# Patient Record
Sex: Female | Born: 1955 | Race: White | Hispanic: No | Marital: Married | Smoking: Former smoker
Health system: Southern US, Community
[De-identification: ages and names within clinical notes are randomized; demographics above are authoritative.]

## PROBLEM LIST (undated history)

## (undated) DIAGNOSIS — R238 Other skin changes: Secondary | ICD-10-CM

## (undated) DIAGNOSIS — R0602 Shortness of breath: Secondary | ICD-10-CM

## (undated) DIAGNOSIS — H3500 Unspecified background retinopathy: Secondary | ICD-10-CM

## (undated) DIAGNOSIS — K635 Polyp of colon: Secondary | ICD-10-CM

## (undated) DIAGNOSIS — K625 Hemorrhage of anus and rectum: Secondary | ICD-10-CM

## (undated) DIAGNOSIS — R5383 Other fatigue: Secondary | ICD-10-CM

## (undated) DIAGNOSIS — K760 Fatty (change of) liver, not elsewhere classified: Secondary | ICD-10-CM

## (undated) DIAGNOSIS — M1990 Unspecified osteoarthritis, unspecified site: Secondary | ICD-10-CM

## (undated) DIAGNOSIS — I10 Essential (primary) hypertension: Secondary | ICD-10-CM

## (undated) DIAGNOSIS — E139 Other specified diabetes mellitus without complications: Secondary | ICD-10-CM

## (undated) DIAGNOSIS — K589 Irritable bowel syndrome without diarrhea: Secondary | ICD-10-CM

## (undated) DIAGNOSIS — G2581 Restless legs syndrome: Secondary | ICD-10-CM

## (undated) DIAGNOSIS — K7290 Hepatic failure, unspecified without coma: Secondary | ICD-10-CM

## (undated) DIAGNOSIS — R413 Other amnesia: Secondary | ICD-10-CM

## (undated) DIAGNOSIS — K7460 Unspecified cirrhosis of liver: Secondary | ICD-10-CM

## (undated) DIAGNOSIS — K219 Gastro-esophageal reflux disease without esophagitis: Secondary | ICD-10-CM

## (undated) DIAGNOSIS — R61 Generalized hyperhidrosis: Secondary | ICD-10-CM

## (undated) DIAGNOSIS — K649 Unspecified hemorrhoids: Secondary | ICD-10-CM

## (undated) DIAGNOSIS — F32A Depression, unspecified: Secondary | ICD-10-CM

## (undated) DIAGNOSIS — R630 Anorexia: Secondary | ICD-10-CM

## (undated) DIAGNOSIS — E785 Hyperlipidemia, unspecified: Secondary | ICD-10-CM

## (undated) DIAGNOSIS — Z803 Family history of malignant neoplasm of breast: Secondary | ICD-10-CM

## (undated) DIAGNOSIS — K469 Unspecified abdominal hernia without obstruction or gangrene: Secondary | ICD-10-CM

## (undated) DIAGNOSIS — D649 Anemia, unspecified: Secondary | ICD-10-CM

## (undated) DIAGNOSIS — F419 Anxiety disorder, unspecified: Secondary | ICD-10-CM

## (undated) DIAGNOSIS — F329 Major depressive disorder, single episode, unspecified: Secondary | ICD-10-CM

## (undated) DIAGNOSIS — R21 Rash and other nonspecific skin eruption: Secondary | ICD-10-CM

## (undated) DIAGNOSIS — G629 Polyneuropathy, unspecified: Secondary | ICD-10-CM

## (undated) DIAGNOSIS — H3530 Unspecified macular degeneration: Secondary | ICD-10-CM

## (undated) DIAGNOSIS — Z972 Presence of dental prosthetic device (complete) (partial): Secondary | ICD-10-CM

## (undated) DIAGNOSIS — J449 Chronic obstructive pulmonary disease, unspecified: Secondary | ICD-10-CM

## (undated) DIAGNOSIS — R233 Spontaneous ecchymoses: Secondary | ICD-10-CM

## (undated) DIAGNOSIS — K7682 Hepatic encephalopathy: Secondary | ICD-10-CM

## (undated) HISTORY — DX: Other skin changes: R23.8

## (undated) HISTORY — DX: Other fatigue: R53.83

## (undated) HISTORY — DX: Generalized hyperhidrosis: R61

## (undated) HISTORY — DX: Shortness of breath: R06.02

## (undated) HISTORY — DX: Hyperlipidemia, unspecified: E78.5

## (undated) HISTORY — DX: Unspecified osteoarthritis, unspecified site: M19.90

## (undated) HISTORY — DX: Essential (primary) hypertension: I10

## (undated) HISTORY — DX: Presence of dental prosthetic device (complete) (partial): Z97.2

## (undated) HISTORY — DX: Rash and other nonspecific skin eruption: R21

## (undated) HISTORY — DX: Other amnesia: R41.3

## (undated) HISTORY — DX: Spontaneous ecchymoses: R23.3

## (undated) HISTORY — DX: Unspecified abdominal hernia without obstruction or gangrene: K46.9

## (undated) HISTORY — DX: Restless legs syndrome: G25.81

## (undated) HISTORY — DX: Unspecified hemorrhoids: K64.9

## (undated) HISTORY — DX: Family history of malignant neoplasm of breast: Z80.3

## (undated) HISTORY — DX: Polyp of colon: K63.5

## (undated) HISTORY — DX: Irritable bowel syndrome, unspecified: K58.9

## (undated) HISTORY — DX: Hemorrhage of anus and rectum: K62.5

## (undated) HISTORY — DX: Unspecified macular degeneration: H35.30

## (undated) HISTORY — DX: Unspecified background retinopathy: H35.00

## (undated) HISTORY — PX: HERNIA REPAIR: SHX51

## (undated) HISTORY — DX: Anorexia: R63.0

---

## 1997-07-20 ENCOUNTER — Other Ambulatory Visit: Admission: RE | Admit: 1997-07-20 | Discharge: 1997-07-20 | Payer: Self-pay | Admitting: Obstetrics and Gynecology

## 1998-12-17 ENCOUNTER — Emergency Department (HOSPITAL_COMMUNITY): Admission: EM | Admit: 1998-12-17 | Discharge: 1998-12-18 | Payer: Self-pay | Admitting: Emergency Medicine

## 1998-12-18 ENCOUNTER — Encounter: Payer: Self-pay | Admitting: Emergency Medicine

## 1999-02-18 ENCOUNTER — Other Ambulatory Visit: Admission: RE | Admit: 1999-02-18 | Discharge: 1999-02-28 | Payer: Self-pay

## 1999-11-15 ENCOUNTER — Other Ambulatory Visit: Admission: RE | Admit: 1999-11-15 | Discharge: 1999-11-15 | Payer: Self-pay | Admitting: Obstetrics and Gynecology

## 2006-10-22 ENCOUNTER — Other Ambulatory Visit: Admission: RE | Admit: 2006-10-22 | Discharge: 2006-10-22 | Payer: Self-pay | Admitting: Family Medicine

## 2007-06-06 ENCOUNTER — Emergency Department (HOSPITAL_COMMUNITY): Admission: EM | Admit: 2007-06-06 | Discharge: 2007-06-06 | Payer: Self-pay | Admitting: Emergency Medicine

## 2008-09-07 ENCOUNTER — Other Ambulatory Visit: Admission: RE | Admit: 2008-09-07 | Discharge: 2008-09-07 | Payer: Self-pay | Admitting: Family Medicine

## 2009-09-14 ENCOUNTER — Encounter: Admission: RE | Admit: 2009-09-14 | Discharge: 2009-09-14 | Payer: Self-pay | Admitting: Gastroenterology

## 2010-04-07 ENCOUNTER — Encounter: Payer: Self-pay | Admitting: Gastroenterology

## 2010-06-17 ENCOUNTER — Other Ambulatory Visit: Payer: Self-pay | Admitting: Family Medicine

## 2010-06-17 DIAGNOSIS — R1900 Intra-abdominal and pelvic swelling, mass and lump, unspecified site: Secondary | ICD-10-CM

## 2010-06-20 ENCOUNTER — Ambulatory Visit
Admission: RE | Admit: 2010-06-20 | Discharge: 2010-06-20 | Disposition: A | Discharge status: Home / Self Care | Payer: 59 | Source: Ambulatory Visit | Attending: Family Medicine | Admitting: Family Medicine

## 2010-06-20 DIAGNOSIS — R1900 Intra-abdominal and pelvic swelling, mass and lump, unspecified site: Secondary | ICD-10-CM

## 2010-06-20 MED ORDER — IOHEXOL 300 MG/ML  SOLN
125.0000 mL | Freq: Once | INTRAMUSCULAR | Status: AC | PRN
  Administered 2010-06-20: 125 mL via INTRAVENOUS

## 2010-06-27 ENCOUNTER — Other Ambulatory Visit: Payer: Self-pay | Admitting: Family Medicine

## 2010-06-27 DIAGNOSIS — K7689 Other specified diseases of liver: Secondary | ICD-10-CM

## 2010-06-29 ENCOUNTER — Ambulatory Visit
Admission: RE | Admit: 2010-06-29 | Discharge: 2010-06-29 | Disposition: A | Discharge status: Home / Self Care | Payer: 59 | Source: Ambulatory Visit | Attending: Family Medicine | Admitting: Family Medicine

## 2010-06-29 DIAGNOSIS — K7689 Other specified diseases of liver: Secondary | ICD-10-CM

## 2010-06-29 MED ORDER — GADOBENATE DIMEGLUMINE 529 MG/ML IV SOLN
20.0000 mL | Freq: Once | INTRAVENOUS | Status: AC | PRN
  Administered 2010-06-29: 20 mL via INTRAVENOUS

## 2010-11-05 ENCOUNTER — Encounter (INDEPENDENT_AMBULATORY_CARE_PROVIDER_SITE_OTHER): Payer: Self-pay | Admitting: Surgery

## 2010-11-05 ENCOUNTER — Encounter (INDEPENDENT_AMBULATORY_CARE_PROVIDER_SITE_OTHER): Payer: 59 | Admitting: Surgery

## 2010-11-06 ENCOUNTER — Ambulatory Visit (INDEPENDENT_AMBULATORY_CARE_PROVIDER_SITE_OTHER): Payer: 59 | Admitting: Surgery

## 2010-11-06 ENCOUNTER — Encounter (INDEPENDENT_AMBULATORY_CARE_PROVIDER_SITE_OTHER): Payer: Self-pay | Admitting: Surgery

## 2010-11-06 VITALS — BP 132/86 | HR 76 | Temp 97.2°F | Ht 64.0 in | Wt 203.0 lb

## 2010-11-06 DIAGNOSIS — M6208 Separation of muscle (nontraumatic), other site: Secondary | ICD-10-CM | POA: Insufficient documentation

## 2010-11-06 DIAGNOSIS — K811 Chronic cholecystitis: Secondary | ICD-10-CM | POA: Insufficient documentation

## 2010-11-06 DIAGNOSIS — R1011 Right upper quadrant pain: Secondary | ICD-10-CM | POA: Insufficient documentation

## 2010-11-06 DIAGNOSIS — M6200 Separation of muscle (nontraumatic), unspecified site: Secondary | ICD-10-CM

## 2010-11-06 DIAGNOSIS — K824 Cholesterolosis of gallbladder: Secondary | ICD-10-CM

## 2010-11-06 DIAGNOSIS — R109 Unspecified abdominal pain: Secondary | ICD-10-CM

## 2010-11-06 NOTE — Progress Notes (Signed)
This encounter was created in error - please disregard.

## 2010-11-06 NOTE — Patient Instructions (Signed)
See Handouts.  Consider surgery

## 2010-11-06 NOTE — Progress Notes (Addendum)
Subjective:     Patient ID: [name], female   DOB: September 13, 1955, 55 y.o.   MRN: [id number]  HPI  Patient is a pleasant obese female who is noticed some abdominal pains for the past 2 years and a new lump in her upper abdomen in the past few months.  Dr. Clarene Duke sent the patient to me over concerns of a possible ventral wall hernia  She does get some intermittent pains in the upper abdomen. Often near on the right upper quadrant as well. Occasionally pulling on the left side. Usually it is burning.  She's had a couple episodes of nausea and vomiting associated with it. Usually after eating. She tolerated most foods. The pain does not seem to be associated with activity. However, she notes when she turns in bed it bothers her. The pain has become more persistent now.   She has chronic reflux disease but is well controlled on Nexium. This does not feel like that. She is followed by Dr. Dulce Sellar with [facility]. In upper endoscopy showed a suffered a stomach polyp otherwise normal. The colonoscopy showed some a few small colon polyps. She struggles with intermittent constipation or diarrhea. She has a poorly controlled diabetes but it getting up or controlled help of Dr. Sharl Ma.  She has good exercise tolerance. No cardiac disease. She comes in today with her daughter.  He notes that more recently she saw a lump in her upper abdomen. She's had a general upper abdominal bulging for several years that goes away when she stands. Lump is newer. She did get a CT scan in April 2012 which showed no definite ventral hernia and some right hepatic lobe liver changes. An MRI showed this area and the right liver was fatty change.  In 2011 after an episode of N/V, she had an ultrasound that did show a 6mm gallbladder polyp. MRI did not see any gallbladder masses or biliary obstruction.   Past Medical History  Diagnosis Date  . Hypertension   . Diabetes mellitus   . Hyperlipidemia   . Fatigue   . Night sweats   .  Wears dentures   . Poor appetite   . Retinopathy   . Arthritis   . Rash   . Bruises easily   . Colon polyp   . Hernia   . Rectal bleeding   . Hemorrhoid   . Family history of breast cancer      No past surgical history on file.  Allergies  Allergen Reactions  . Penicillins Hives and Itching    All over the body.   Current Outpatient Prescriptions on File Prior to Visit  Medication Sig Dispense Refill  . Cholecalciferol (VITAMIN D-3 PO) Take by mouth. 1000 IV       . colesevelam (WELCHOL) 625 MG tablet Take 1,875 mg by mouth 2 (two) times daily. Three pills taken per dosage. Total of six per day.       . DULoxetine (CYMBALTA) 60 MG capsule Take 60 mg by mouth daily.        [name] Kitchen esomeprazole (NEXIUM) 40 MG capsule Take 40 mg by mouth daily before breakfast.        . ezetimibe (ZETIA) 10 MG tablet Take 10 mg by mouth daily.        . Insulin Detemir (LEVEMIR [place]all) Inject 140 mg into the skin daily.        . metFORMIN (GLUMETZA) 1000 MG (MOD) 24 hr tablet Take 1,000 mg by mouth 2 (two)  times daily with a meal.        . pravastatin (PRAVACHOL) 20 MG tablet Take 20 mg by mouth daily.          Review of Systems  Constitutional: Negative for fever, chills, diaphoresis, appetite change and fatigue.  HENT: Negative for ear pain, sore throat, trouble swallowing, neck pain and ear discharge.   Eyes: Negative for photophobia, discharge and visual disturbance.  Respiratory: Negative for cough, choking, chest tightness and shortness of breath.   Cardiovascular: Negative for chest pain and palpitations.  Gastrointestinal: Positive for nausea, abdominal pain, diarrhea and constipation. Negative for vomiting, blood in stool, anal bleeding and rectal pain.  Genitourinary: Negative for dysuria, urgency, frequency, hematuria, vaginal bleeding, vaginal discharge and difficulty urinating.  Musculoskeletal: Negative for myalgias and gait problem.  Skin: Negative for color change, pallor and rash.    Neurological: Negative for dizziness, speech difficulty, weakness and numbness.  Hematological: Negative for adenopathy.  Psychiatric/Behavioral: Negative for confusion and agitation. The patient is not nervous/anxious.        Objective:   Physical Exam  Constitutional: She is oriented to person, place, and time. She appears well-developed and well-nourished. No distress.  HENT:  Head: Normocephalic.  Mouth/Throat: Oropharynx is clear and moist. No oropharyngeal exudate.  Eyes: Conjunctivae and EOM are normal. Pupils are equal, round, and reactive to light. No scleral icterus.  Neck: Normal range of motion. Neck supple. No tracheal deviation present.  Cardiovascular: Normal rate, regular rhythm and intact distal pulses.   Pulmonary/Chest: Effort normal and breath sounds normal. No respiratory distress. She exhibits no tenderness.  Abdominal: Soft. She exhibits no distension. There is no rebound and no guarding. Hernia confirmed negative in the right inguinal area and confirmed negative in the left inguinal area.       Obese, soft.  Moderate supraumb diastasis recti.  Supraumb/RUQ 5cm mass, persistent with standing ? Incarcerated VWH vs distended GB  Genitourinary: No vaginal discharge found.  Musculoskeletal: Normal range of motion. She exhibits no tenderness.  Lymphadenopathy:    She has no cervical adenopathy.       Right: No inguinal adenopathy present.       Left: No inguinal adenopathy present.  Neurological: She is alert and oriented to person, place, and time. No cranial nerve deficit. She exhibits normal muscle tone. Coordination normal.  Skin: Skin is warm and dry. No rash noted. She is not diaphoretic. No erythema.  Psychiatric: She has a normal mood and affect. Her behavior is normal. Judgment and thought content normal.       Assessment:     Supraumbilical bulging concerning for incarcerated ventral hernia in the setting of diastases recti.  Gallbladder polyp with  nausea vomiting pain suspicious for biliary colic.    Plan:     At this point, I think she would benefit from diagnostic laparoscopy. The bulge may just beardline gallbladder. Another possibly lipoma. However the stone seemed has likely given the normal studies earlier this year. Lump is new however, it is reasonable to see if she has a true hernia. While I am in there I think she would benefit from cholecystectomy as well since  she has a polyp and I do not think her nausea vomiting and diarrhea repeated episodes are explained by the hernia itself. I suspect she has an irritable bowel component as well. I talked her about doing a fiber regimen as well. She and her daughter agree with proceeding. We will work to coordinate this at  a convenient time.   The anatomy & physiology of hepatobiliary & pancreatic function was discussed.  The pathophysiology of gallbladder dysfunction was discussed.  Natural history risks without surgery was discussed.   I feel the risks of no intervention will lead to serious problems that outweigh the operative risks; therefore, I recommended cholecystectomy to remove the pathology.  I explained laparoscopic techniques with possible need for an open approach.  Probable cholangiogram to evaluate the bilary tract was explained as well.    The anatomy & physiology of the abdominal wall was discussed.  The pathophysiology of hernias was discussed.  Natural history risks without surgery of enlargement, pain, incarceration & strangulation was discussed.   Contributors to complications such as smoking, obesity, diabetes, prior surgery, etc were discussed.  I feel the risks of no intervention will lead to serious problems that outweigh the operative risks; therefore, I recommended surgery to reduce and repair the hernia.  I explained laparoscopic techniques with possible need for an open approach.  I noted the probable use of mesh to patch and/or buttress hernia repair  Risks such as  bleeding, infection, abscess, need for further treatment, heart attack, death, and other risks were discussed.  Risks such as bleeding, infection, abscess, leak, injury to other organs, need for further treatment, heart attack, death, and other risks were discussed.  Possibility that this will not correct all abdominal symptoms was explained.  Goals of post-operative recovery were discussed as well.  Possibility that this will not correct all symptoms was explained.  I stressed the importance of low-impact activity, aggressive pain control, avoiding constipation, & not pushing through pain to minimize risk of post-operative chronic pain or injury. Possibility of reherniation was discussed.  We will work to minimize complications.   An educational handout further explaining the pathology & treatment options was given as well.  Questions were answered.  The patient expresses understanding & wishes to proceed with surgery.

## 2010-11-16 HISTORY — PX: LIVER BIOPSY: SHX301

## 2010-11-22 ENCOUNTER — Other Ambulatory Visit (INDEPENDENT_AMBULATORY_CARE_PROVIDER_SITE_OTHER): Payer: Self-pay | Admitting: Surgery

## 2010-11-22 ENCOUNTER — Encounter (HOSPITAL_COMMUNITY): Payer: Managed Care, Other (non HMO)

## 2010-11-22 LAB — SURGICAL PCR SCREEN
MRSA, PCR: NEGATIVE
Staphylococcus aureus: NEGATIVE

## 2010-11-22 LAB — CBC
Hemoglobin: 13.3 g/dL (ref 12.0–15.0)
MCHC: 32.3 g/dL (ref 30.0–36.0)
Platelets: 201 10*3/uL (ref 150–400)
RBC: 4.76 MIL/uL (ref 3.87–5.11)

## 2010-11-22 LAB — BASIC METABOLIC PANEL
CO2: 27 mEq/L (ref 19–32)
GFR calc non Af Amer: >60 mL/min (ref >60)
Glucose, Bld: 132 mg/dL — ABNORMAL HIGH (ref 70–99)
Potassium: 4.1 mEq/L (ref 3.5–5.1)
Sodium: 138 mEq/L (ref 135–145)

## 2010-11-28 ENCOUNTER — Ambulatory Visit (HOSPITAL_COMMUNITY)
Admission: RE | Admit: 2010-11-28 | Discharge: 2010-11-28 | Disposition: A | Discharge status: Home / Self Care | Payer: Managed Care, Other (non HMO) | Source: Ambulatory Visit | Attending: Surgery | Admitting: Surgery

## 2010-11-28 ENCOUNTER — Other Ambulatory Visit (INDEPENDENT_AMBULATORY_CARE_PROVIDER_SITE_OTHER): Payer: Self-pay | Admitting: Surgery

## 2010-11-28 DIAGNOSIS — M6200 Separation of muscle (nontraumatic), unspecified site: Secondary | ICD-10-CM | POA: Insufficient documentation

## 2010-11-28 DIAGNOSIS — K219 Gastro-esophageal reflux disease without esophagitis: Secondary | ICD-10-CM | POA: Insufficient documentation

## 2010-11-28 DIAGNOSIS — E119 Type 2 diabetes mellitus without complications: Secondary | ICD-10-CM | POA: Insufficient documentation

## 2010-11-28 DIAGNOSIS — E785 Hyperlipidemia, unspecified: Secondary | ICD-10-CM | POA: Insufficient documentation

## 2010-11-28 DIAGNOSIS — Z01812 Encounter for preprocedural laboratory examination: Secondary | ICD-10-CM | POA: Insufficient documentation

## 2010-11-28 DIAGNOSIS — R112 Nausea with vomiting, unspecified: Secondary | ICD-10-CM | POA: Insufficient documentation

## 2010-11-28 DIAGNOSIS — K7689 Other specified diseases of liver: Secondary | ICD-10-CM | POA: Insufficient documentation

## 2010-11-28 DIAGNOSIS — K429 Umbilical hernia without obstruction or gangrene: Secondary | ICD-10-CM | POA: Insufficient documentation

## 2010-11-28 DIAGNOSIS — K811 Chronic cholecystitis: Secondary | ICD-10-CM

## 2010-11-28 DIAGNOSIS — K824 Cholesterolosis of gallbladder: Secondary | ICD-10-CM | POA: Insufficient documentation

## 2010-11-28 DIAGNOSIS — Z794 Long term (current) use of insulin: Secondary | ICD-10-CM | POA: Insufficient documentation

## 2010-11-28 DIAGNOSIS — I10 Essential (primary) hypertension: Secondary | ICD-10-CM | POA: Insufficient documentation

## 2010-11-28 DIAGNOSIS — Z01810 Encounter for preprocedural cardiovascular examination: Secondary | ICD-10-CM | POA: Insufficient documentation

## 2010-11-28 DIAGNOSIS — Z79899 Other long term (current) drug therapy: Secondary | ICD-10-CM | POA: Insufficient documentation

## 2010-11-28 HISTORY — PX: CHOLECYSTECTOMY: SHX55

## 2010-11-28 LAB — GLUCOSE, CAPILLARY: Glucose-Capillary: 124 mg/dL — ABNORMAL HIGH (ref 70–99)

## 2010-11-29 ENCOUNTER — Telehealth (INDEPENDENT_AMBULATORY_CARE_PROVIDER_SITE_OTHER): Payer: Self-pay | Admitting: General Surgery

## 2010-11-29 NOTE — Telephone Encounter (Signed)
[name] CALLED TO REPORT THAT [name] HAD BRUISING RLQ AND THAT DRESSING WITH TEGADERM APPLIED NEAR UMBILICUS WAS BLOODY. ALSO OXYCODONE WAS NOT HELPING HER PAIN. I REPORTED THIS TO DR. Michaell Cowing AND HE SAID BLOODY DRESSING COULD BE REMOVED, HOLD PRESSURE WITH GAUZE FOR SEVERAL MINUTES AND APPLY ICE TO AREA AS A COMFORT MEASURE. HE ORDERED TRAMADOL 50-100MG  Q 6HRS PRN PAIN #40.  [name] WAS INSTRUCTED TO CALL BACK THIS PM IF PAIN DID NOT IMPROVE AND IF INCISION AREA CONTINUED TO BLEED. SHE WAS TOLD OF ON CALL DR. AVAILABLE OVER WEEKEND. TRAMADOL ORDER WAS CALLED TO [facility] [address] [phone]. [name] NOTIFIED.

## 2010-11-30 ENCOUNTER — Emergency Department (HOSPITAL_COMMUNITY)
Admission: EM | Admit: 2010-11-30 | Discharge: 2010-12-01 | Disposition: A | Discharge status: Home / Self Care | Payer: Managed Care, Other (non HMO) | Attending: Diagnostic Radiology | Admitting: Diagnostic Radiology

## 2010-11-30 DIAGNOSIS — Z9889 Other specified postprocedural states: Secondary | ICD-10-CM | POA: Insufficient documentation

## 2010-11-30 DIAGNOSIS — E785 Hyperlipidemia, unspecified: Secondary | ICD-10-CM | POA: Insufficient documentation

## 2010-11-30 DIAGNOSIS — Z794 Long term (current) use of insulin: Secondary | ICD-10-CM | POA: Insufficient documentation

## 2010-11-30 DIAGNOSIS — E119 Type 2 diabetes mellitus without complications: Secondary | ICD-10-CM | POA: Insufficient documentation

## 2010-11-30 DIAGNOSIS — G8918 Other acute postprocedural pain: Secondary | ICD-10-CM | POA: Insufficient documentation

## 2010-11-30 DIAGNOSIS — Z9089 Acquired absence of other organs: Secondary | ICD-10-CM | POA: Insufficient documentation

## 2010-11-30 DIAGNOSIS — R109 Unspecified abdominal pain: Secondary | ICD-10-CM | POA: Insufficient documentation

## 2010-12-01 ENCOUNTER — Emergency Department (HOSPITAL_COMMUNITY): Payer: Managed Care, Other (non HMO)

## 2010-12-01 LAB — CBC
Hemoglobin: 12.3 g/dL (ref 12.0–15.0)
MCH: 28.4 pg (ref 26.0–34.0)
MCHC: 33.2 g/dL (ref 30.0–36.0)
Platelets: 199 10*3/uL (ref 150–400)
RDW: 15.1 % (ref 11.5–15.5)

## 2010-12-01 LAB — LACTIC ACID, PLASMA: Lactic Acid, Venous: 1.7 mmol/L (ref 0.5–2.2)

## 2010-12-01 LAB — URINALYSIS, ROUTINE W REFLEX MICROSCOPIC
Glucose, UA: NEGATIVE mg/dL
Hgb urine dipstick: NEGATIVE
Protein, ur: NEGATIVE mg/dL
Specific Gravity, Urine: 1.007 (ref 1.005–1.030)
pH: 7 (ref 5.0–8.0)

## 2010-12-01 LAB — URINE MICROSCOPIC-ADD ON

## 2010-12-01 LAB — COMPREHENSIVE METABOLIC PANEL
ALT: 28 U/L (ref 0–35)
AST: 34 U/L (ref 0–37)
Albumin: 3.2 g/dL — ABNORMAL LOW (ref 3.5–5.2)
CO2: 27 mEq/L (ref 19–32)
Calcium: 9.4 mg/dL (ref 8.4–10.5)
Creatinine, Ser: <0.47 mg/dL — ABNORMAL LOW (ref 0.50–1.10)
Sodium: 134 mEq/L — ABNORMAL LOW (ref 135–145)
Total Protein: 7.7 g/dL (ref 6.0–8.3)

## 2010-12-01 LAB — DIFFERENTIAL
Basophils Absolute: 0 10*3/uL (ref 0.0–0.1)
Basophils Relative: 0 % (ref 0–1)
Eosinophils Absolute: 0.2 10*3/uL (ref 0.0–0.7)
Monocytes Absolute: 0.7 10*3/uL (ref 0.1–1.0)
Monocytes Relative: 8 % (ref 3–12)
Neutro Abs: 4.9 10*3/uL (ref 1.7–7.7)

## 2010-12-02 LAB — URINE CULTURE: Colony Count: 4000

## 2010-12-03 ENCOUNTER — Telehealth (INDEPENDENT_AMBULATORY_CARE_PROVIDER_SITE_OTHER): Payer: Self-pay

## 2010-12-03 NOTE — Telephone Encounter (Signed)
Pt's daughter called and complained that the pt had not had a bm since 9/12 the day before surgery.  She has been drinking liquids and started taking Miralax 9-17 and took a dose today.  She is not passing gas.  She has been walking around.  When she urinates, she does not fully empty but only trickles a little amount.  She has no pain or burning with that.  She has some shortness of breath and pain with inspiration under the ribcage.  She went to [facility] ER Saturday for shortness of breath and fever.  She had not urinated much then.  They gave her an iv and antibiotics.  I paged Dr Michaell Cowing and he advised to double up on the Miralax, take it BID and try an enema.  She can get checked by her medical MD for the urinary problems or go to the er if can't wait.  We can order a cbc, cmet and ua if she doesn't go to her medical md to get checked.  Make sure she is drinking plenty of liquids.    I called the daughter and advised of above.  She will have her try the enema and Miralax bid.  She said she just had the labs checked Saturday and I told her Dr Michaell Cowing reviewed those.  I asked her also to stay on a liquid diet until she has a bm.  She will try those suggestions and call if no worse.

## 2010-12-04 ENCOUNTER — Telehealth (INDEPENDENT_AMBULATORY_CARE_PROVIDER_SITE_OTHER): Payer: Self-pay

## 2010-12-04 NOTE — Op Note (Signed)
NAMECREOLA, [name] NO.:  [id number]  MEDICAL RECORD NO.:  [id number]  LOCATION:  [facility]                         FACILITY:  [facility]  PHYSICIAN:  Ardeth Sportsman, MD     DATE OF BIRTH:  [birth year]  DATE OF PROCEDURE:  11/28/2010 DATE OF DISCHARGE:  11/22/2010                              OPERATIVE REPORT   PRIMARY CARE PHYSICIAN:  Caryn Bee L. Little, MD  GASTROENTEROLOGIST:  Willis Modena, MD  ENDOCRINOLOGIST:  Georga Kaufmann, MD  SURGEON:  Karie Soda, MD  ASSISTANT:  RN  PREOPERATIVE DIAGNOSES: 1. Gallbladder polyp, question of biliary colic. 2. Upper abdominal wall mass, question of incarcerated ventral hernia.  POSTOPERATIVE DIAGNOSES: 1. Gallbladder polyp with possible mild chronic cholecystitis. 2. Macronodularity of the liver strongly suspicious for cirrhosis with     fatty steatohepatitis. 3. 8 mm umbilical hernia, non-incarcerated. 4. No evidence of any other hernia.  PROCEDURES: 1.  Laparoscopic cholecystectomy 2.  Core liver biopsies x3 (14G Tru-cut)  SPECIMENS: 1. Gallbladder. 2. Core liver biopsies x3.  DRAINS:  None.  ESTIMATED BLOOD LOSS:  Minimal.  COMPLICATIONS:  None apparent.  INDICATIONS FOR PROCEDURE:  Ms. Lupita Leash is a 55 year old morbidly obese female who is sent for a supraumbilical mass, questionable ventral hernia.  She had CT, MRIs that showed fatty change of the liver for some increased LFTs.  She was found to have a gallbladder polyp as well.  She has had episodes of nausea and vomiting.  She has been followed by [facility].  Dr. Dulce Sellar had done workup that showed some GERD that was controlled with PPI.  At this point, I felt she would benefit from diagnostic laparoscopy to rule out incarcerated hernia versus a lipoma.  I recommended with the gallbladder polyp and symptoms, to consider a removal of gallbladder. Technique, risks, benefits, and alternatives were discussed.  Questions answered and she  agreed to proceed.  OPERATIVE FINDINGS:  She had a nodularity of the liver strongly suspicious for early cirrhosis.  I did core biopsies.  She had a little bit of greater omentum adherent to the left anterior abdominal wall, but well away from the midline.  She had moderate diastasis recti, but stable.  She had a small umbilical hernia, but not incarcerated.  I saw no evidence of any other anterior ventral wall defects.  The masses that she had been feeling was her nodular liver.  Her gallbladder was boggy and mildly dilated.  I did not do a cholangiogram given the short cystic duct.    DESCRIPTION OF PROCEDURE:  Informed consent was confirmed.  The patient received IV antibiotics.  She underwent general anesthesia without any difficulty.  She was positioned supine with arms tucked.  She had voided just prior to coming to the operating room.  She had the sequential compression devices active during the entire case.  Her abdomen was prepped and draped in a sterile fashion.  Surgical time-out confirmed our plan.  I placed a #5 mm port in the right upper quadrant using optical entry technique with the patient in steep reverse Trendelenburg and right side up.  A sort of in the paramedian region.  The entry was clean.  I induced carbon dioxide insufflation.  I saw no injury.  I could immediately see a  nodular liver strongly suspicious for cirrhosis.  Her gallbladder was little bit boggy and I had a concern for some mild chronic cholecystitis.  I went ahead and placed 5 mm ports in the right flank as well as in the epigastric region.  I placed a 10 mm port through the umbilical hernia through the supraumbilical incision.  I turned attention towards the gallbladder since I could not find any ventral hernia.  Elevated the dome of the gallbladder.  I could not fully elevate as the liver was rather stiff.  Her duodenal bulb and some greater omentum was mildly adherent to the anterior  gallbladder wall suspicious for chronic cholecystis.  I was able to freed that off using a focused hook cautery and sharp dissection.  I was able to free the peritoneal coverings around the anteriomedial and posteriolateral walls of the gallbladder. The gallbladder was rather fragile and I did have a tear at the fundus when trying to elevate it.  I did dissection around the infundibulum and got another tear around there as well.  However, I freed the proximal two-thirds of gallbladder off the liver bed to get a critical view.  I could see the anterior and posterior branches of the cystic artery.  The posterior artery was a little more dominant.  I controlled those with clips and hook cautery on the gallbladder side.  I stayed high on the gallbladder.  I did further dissection to help skeletonize the infundibulum.  The cystic duct was very short.  I  did attempt to do a cholangiogram.  I placed an Angiocath through the abdominal wall and placed a Reddick catheter through that.  I placed the catheter into the infundibulum and down the cystic duct, but I could not get it advanced >1.5cm.  I blew up the balloon.  However, the balloon would not help the catheter stay in place with flushing.  I placed clips above it, but there was still a significant leak around it.  I felt that I could not get a good technical cholangiogram with that significant leak without doing more aggressive cut down.  Because she had another reason for transaminitis with her liver and she did not have any frank jaundice, biliary obstruction, nor pancreatitis symptoms, I decided to avoid cholangiogram.  I went ahead and transected the infundibulum off the gallbladder.  I ligated the infundibulum and the short cystic duct using 0 PDS Endoloops x2 since the cystic duct was little bit thickened.  I placed a few clips around there as well.  I freed the gallbladder from its attachments on the liver bed.  I placed the  gallbladder within an EndoCatch bag and removed out of the umbilical port without difficulty.  I did meticulous inspection of the liver bed.  There were couple small oozing areas that easily controlled with spatula-tip cautery.  Clips were intact in the cystic duct.  Because of no documentation of cirrhosis noted before, I went ahead to get core biopsies to help determine workup and etiology.  I used a 14- gauge Tru-Cut needle through the right subcostal ridge of the prior puncture site of cholangiogram.  I got 3 good cores on the anterior right lobe of the liver.  I used a spatula-tip cautery to provide good hemostasis at those points.  I did see some adhesions on the left upper quadrant, the anterior abdominal wall, and greater omentum.  I lysed those off with hook cautery.  I again re-inspected and found no hernias aside from the small umbilical hernia I had noted before.  I could see no hernias or abnormalities in the falciform ligament.  I could see some fullness in the medial left hepatic lobe, which was consistent with her midline bulging and firmness.  Once she had been insufflated, the abdominal wall firmness went away and then returned when she was desufflated, consistent with her feeling her own nodular liver.  I evacuated carbon dioxide, removed the ports.  I closed the umbilical hernia using 0 Vicryl interrupted stitches.  I closed skin using 4-0 Monocryl stitches.  Sterile dressing applied.  The patient was then extubated and sent to recovery room  in stable condition.  I discussed findings with the patient's family.     Ardeth Sportsman, MD     SCG/MEDQ  D:  11/28/2010  T:  11/28/2010  Job:  [id number]  cc:   Caryn Bee L. Little, M.D. Fax: [phone]  Willis Modena, MD Fax: [phone]  Georga Kaufmann, MD  Electronically Signed by Karie Soda MD on 12/04/2010 08:25:40 AM

## 2010-12-04 NOTE — Telephone Encounter (Signed)
Called pt to check on her after Dr Michaell Cowing had heard pt was having problems after surgery. The pt has not had a BM in over a wk since surgery and she has a lot of bloating with soreness. The pt is taking the Miralax everyday but no BM yet. I did advise pt to go get Milk of Magnesia and take 4 tbls. To get her bowels moving and told her she might have to repeat the laxative again if nothing happened after she took it in a few hours. I did advise her to stay on the Miralax as well to try to help get her in a better routine with her bowels. I did advise the pt to call us back if not any better after trying the Milk of Magnesia.Hulda Humphrey

## 2010-12-06 ENCOUNTER — Emergency Department (HOSPITAL_COMMUNITY): Payer: Managed Care, Other (non HMO)

## 2010-12-06 ENCOUNTER — Inpatient Hospital Stay (HOSPITAL_COMMUNITY)
Admission: EM | Admit: 2010-12-06 | Discharge: 2010-12-10 | DRG: 390 | Disposition: A | Discharge status: Home / Self Care | Payer: Managed Care, Other (non HMO) | Attending: Internal Medicine | Admitting: Internal Medicine

## 2010-12-06 DIAGNOSIS — I10 Essential (primary) hypertension: Secondary | ICD-10-CM | POA: Diagnosis present

## 2010-12-06 DIAGNOSIS — K219 Gastro-esophageal reflux disease without esophagitis: Secondary | ICD-10-CM | POA: Diagnosis present

## 2010-12-06 DIAGNOSIS — Z9089 Acquired absence of other organs: Secondary | ICD-10-CM

## 2010-12-06 DIAGNOSIS — R142 Eructation: Secondary | ICD-10-CM | POA: Diagnosis present

## 2010-12-06 DIAGNOSIS — R0602 Shortness of breath: Secondary | ICD-10-CM | POA: Diagnosis present

## 2010-12-06 DIAGNOSIS — K7689 Other specified diseases of liver: Secondary | ICD-10-CM | POA: Diagnosis present

## 2010-12-06 DIAGNOSIS — K560 Paralytic ileus: Principal | ICD-10-CM | POA: Diagnosis present

## 2010-12-06 DIAGNOSIS — E119 Type 2 diabetes mellitus without complications: Secondary | ICD-10-CM | POA: Diagnosis present

## 2010-12-06 DIAGNOSIS — R141 Gas pain: Secondary | ICD-10-CM | POA: Diagnosis present

## 2010-12-06 DIAGNOSIS — R1011 Right upper quadrant pain: Secondary | ICD-10-CM | POA: Diagnosis present

## 2010-12-06 LAB — DIFFERENTIAL
Eosinophils Absolute: 0.2 10*3/uL (ref 0.0–0.7)
Lymphocytes Relative: 26 % (ref 12–46)
Lymphs Abs: 2.3 10*3/uL (ref 0.7–4.0)
Monocytes Relative: 8 % (ref 3–12)
Neutro Abs: 5.8 10*3/uL (ref 1.7–7.7)
Neutrophils Relative %: 64 % (ref 43–77)

## 2010-12-06 LAB — CBC
HCT: 39.7 % (ref 36.0–46.0)
Hemoglobin: 12.9 g/dL (ref 12.0–15.0)
MCH: 28.5 pg (ref 26.0–34.0)
MCV: 87.6 fL (ref 78.0–100.0)
RBC: 4.53 MIL/uL (ref 3.87–5.11)
WBC: 9.1 10*3/uL (ref 4.0–10.5)

## 2010-12-06 LAB — URINALYSIS, ROUTINE W REFLEX MICROSCOPIC
Glucose, UA: NEGATIVE mg/dL
Ketones, ur: NEGATIVE mg/dL
Leukocytes, UA: NEGATIVE
Protein, ur: NEGATIVE mg/dL
Urobilinogen, UA: 0.2 mg/dL (ref 0.0–1.0)

## 2010-12-06 LAB — COMPREHENSIVE METABOLIC PANEL
AST: 47 U/L — ABNORMAL HIGH (ref 0–37)
BUN: 5 mg/dL — ABNORMAL LOW (ref 6–23)
CO2: 28 mEq/L (ref 19–32)
Calcium: 9.2 mg/dL (ref 8.4–10.5)
Chloride: 97 mEq/L (ref 96–112)
Creatinine, Ser: 0.49 mg/dL — ABNORMAL LOW (ref 0.50–1.10)
GFR calc Af Amer: >60 mL/min (ref >60)
GFR calc non Af Amer: >60 mL/min (ref >60)
Glucose, Bld: 65 mg/dL — ABNORMAL LOW (ref 70–99)
Total Bilirubin: 0.6 mg/dL (ref 0.3–1.2)

## 2010-12-06 MED ORDER — IOHEXOL 300 MG/ML  SOLN
100.0000 mL | Freq: Once | INTRAMUSCULAR | Status: AC | PRN
  Administered 2010-12-06: 100 mL via INTRAVENOUS

## 2010-12-06 MED ORDER — IOHEXOL 300 MG/ML  SOLN
100.0000 mL | Freq: Once | INTRAMUSCULAR | Status: AC | PRN
  Administered 2010-12-06: 73 mL via INTRAVENOUS

## 2010-12-07 LAB — CULTURE, BLOOD (ROUTINE X 2)
Culture  Setup Time: 201209161117
Culture: NO GROWTH

## 2010-12-07 LAB — GLUCOSE, CAPILLARY
Glucose-Capillary: 140 mg/dL — ABNORMAL HIGH (ref 70–99)
Glucose-Capillary: 40 mg/dL — CL (ref 70–99)
Glucose-Capillary: 47 mg/dL — ABNORMAL LOW (ref 70–99)
Glucose-Capillary: 116 mg/dL — ABNORMAL HIGH (ref 70–99)
Glucose-Capillary: 90 mg/dL (ref 70–99)

## 2010-12-07 NOTE — H&P (Signed)
NAMEASPIN, PALOMAREZ NO.:  [id number]  MEDICAL RECORD NO.:  [id number]  LOCATION:  1406                         FACILITY:  [facility]  PHYSICIAN:  Talmage Nap, MD  DATE OF BIRTH:  1955-11-21  DATE OF ADMISSION:  12/07/2010 DATE OF DISCHARGE:                             HISTORY & PHYSICAL   PRIMARY CARE PHYSICIAN:  Caryn Bee L. Little, M.D., [facility].  GENERAL SURGEON:  Ardeth Sportsman, MD  History obtainable from the patient and the patient's spouse.  CHIEF COMPLAINT:  Right upper quadrant abdominal pain with associated shortness of breath of about 1-week duration.  The patient is a 55 year old Caucasian female with history of hypertension and diabetes mellitus who had laparoscopic cholecystectomy done with liver biopsy on November 28, 2010.  Surgery was said to be uneventful, however, post-surgery, the patient has been complaining of pain in the right upper quadrant, which she described as very sharp, about 10/10 in intensity, periodically radiating to the back with associated shortness of breath.  She described as, "my breath is being cut off."  She denied any associated fever, chills, or rigor.  She denied any history of nausea or vomiting. She denied any diarrhea and in the past 2-3 days she has not been able to move her bowel.  She also claimed that she noted her abdomen has been slowly getting distended.  She denied any diarrhea, dysuria or hematuria, but constipation.The abdominal distention and the pain in the right upper quadrant was said to have persisted, hence the patient presented to the emergency room to be evaluated.  PAST MEDICAL HISTORY:  Positive for diabetes mellitus and hypertension.  PAST SURGICAL HISTORY:  Most recently, laparoscopic cholecystectomy with liver biopsy, cesarean section, ectopic gestation status post unilateral salpingo-oophorectomy.  PREADMISSION MEDICATIONS:  Include the following:  Vitamin B1, metformin,  Cymbalta, ibuprofen, Levemir FlexPen, Nexium, oxycodone/acetaminophen, pravastatin, Ultram.  ALLERGIES: 1. LATEX. 2. PENICILLIN.  SOCIAL HISTORY:  Negative for alcohol or tobacco use.  The patient lives at home with her spouse.  FAMILY HISTORY:  York Spaniel to be positive for diabetes mellitus.  REVIEW OF SYSTEMS:  The patient denies any history of headaches.  No blurred vision.  No nausea or vomiting.  No fever.  No chills.  No rigor.  Denied any chest pain or shortness of breath.  No cough.  Pain in the right upper quadrant sharp with associated abdominal distention and constipation.  Denied any dysuria or hematuria.  No diarrhea or hematochezia.  No swelling of the lower extremity.  No intolerance to heat or cold and no neuropsychiatric disorder.  PHYSICAL EXAMINATION:  GENERAL:  Very pleasant elderly lady with suboptimal hydration, not in any acute respiratory distress at present. VITAL SIGNS:  Blood pressure is 121/58, pulse is 91, respiratory rate is 20, temperature is 99.2. HEENT:  Mild pallor, but pupils are reactive to light and extraocular muscles are intact. NECK:  No jugular venous distention.  No carotid bruit.  No lymphadenopathy. CHEST:  Clear to auscultation. HEART SOUNDS:  S1 and S2. ABDOMEN:  Distended globally.Difficult organs to palpate.  Bowel sounds are absent. EXTREMITIES:  No pedal edema. NEUROLOGIC EXAM:  Nonfocal. MUSCULOSKELETAL SYSTEM:  Unremarkable. SKIN:  Showed slightly decreased turgor.  LABORATORY DATA:  Initial complete blood count with differential showed WBC of 9.1, hemoglobin of 12.9, hematocrit of 39.7, MCV of 87.6, platelet count of 277, normal differential.  Comprehensive metabolic panel showed sodium of 134, potassium of 4.0, chloride of 970, bicarb of 28, glucose is 65, BUN is 5, creatinine is 0.49.  LFTs showed total bilirubin 0.6, alkaline phosphatase 104, AST 47, ALT 23, lipase 47. Urinalysis unremarkable.  IMAGING STUDIES:  Done  include chest x-ray, which showed low volume lungs with atelectasis.  CT of the abdomen and pelvis with contrast showed small amount of fluid in the gallbladder fossa and small amount of perihepatic ascites.  There is focal hepatic steatosis, possible constipation and bilateral lower lobe atelectasis.  CT angiogram showed no evidence of pulmonary embolism.  There is diffuse prominence of interstitium, which could reflect mild interstitial edema and there is mild scattered coronary artery calcification and 1.5 cm fairly calcified hypodensity in the inferior aspect of the right thyroid lobe, likely benign and there is risk to be noted about the liver, likely reflecting the cholecystectomy.  IMPRESSION: 1. Abdominal distention, questionable ileus versus obstruction?Ogilve. 2. Most recently status post laparoscopic cholecystectomy plus liver     biopsy. 3. Hypertension. 4. Diabetes mellitus.  PLAN:  To admit the patient to general medical floor.  The patient will be n.p.o. for now.  She will have NG tube inserted with intermittent low pressure suctioning.  She will be on half-normal saline IV to go at rate of 100 cc an hour, Protonix 40 mg IV q.24 for GI prophylaxis.  Pain control be done with Dilaudid 1 mg IV q.4 p.r.n. and she will have SCD boot or TED stockings for DVT prophylaxis and General Surgery will be consulted stat.  Further workup to be done on this patient will include repeating CBC, CMP, and magnesium in the a.m.  The patient will be followed and evaluated on day-to-day basis.     Talmage Nap, MD    CN/MEDQ  D:  12/07/2010  T:  12/07/2010  Job:  [id number]  Electronically Signed by Talmage Nap  on 12/07/2010 06:29:10 PM

## 2010-12-08 LAB — COMPREHENSIVE METABOLIC PANEL
Alkaline Phosphatase: 89 U/L (ref 39–117)
BUN: 5 mg/dL — ABNORMAL LOW (ref 6–23)
Calcium: 8.8 mg/dL (ref 8.4–10.5)
Creatinine, Ser: <0.47 mg/dL — ABNORMAL LOW (ref 0.50–1.10)
Glucose, Bld: 88 mg/dL (ref 70–99)
Total Protein: 7.2 g/dL (ref 6.0–8.3)

## 2010-12-08 LAB — CBC
HCT: 36.6 % (ref 36.0–46.0)
Hemoglobin: 11.8 g/dL — ABNORMAL LOW (ref 12.0–15.0)
MCH: 28.2 pg (ref 26.0–34.0)
MCHC: 32.2 g/dL (ref 30.0–36.0)
MCV: 87.6 fL (ref 78.0–100.0)
RDW: 15.7 % — ABNORMAL HIGH (ref 11.5–15.5)

## 2010-12-08 LAB — GLUCOSE, CAPILLARY
Glucose-Capillary: 86 mg/dL (ref 70–99)
Glucose-Capillary: 154 mg/dL — ABNORMAL HIGH (ref 70–99)

## 2010-12-08 LAB — DIFFERENTIAL
Eosinophils Relative: 3 % (ref 0–5)
Lymphocytes Relative: 38 % (ref 12–46)
Monocytes Absolute: 0.5 10*3/uL (ref 0.1–1.0)
Monocytes Relative: 8 % (ref 3–12)
Neutro Abs: 3.3 10*3/uL (ref 1.7–7.7)

## 2010-12-08 LAB — MAGNESIUM: Magnesium: 2.4 mg/dL (ref 1.5–2.5)

## 2010-12-09 ENCOUNTER — Inpatient Hospital Stay (HOSPITAL_COMMUNITY): Payer: Managed Care, Other (non HMO)

## 2010-12-09 LAB — COMPREHENSIVE METABOLIC PANEL
ALT: 16 U/L (ref 0–35)
AST: 36 U/L (ref 0–37)
Albumin: 3.1 g/dL — ABNORMAL LOW (ref 3.5–5.2)
CO2: 27 mEq/L (ref 19–32)
Calcium: 9.1 mg/dL (ref 8.4–10.5)
Chloride: 101 mEq/L (ref 96–112)
Creatinine, Ser: <0.47 mg/dL — ABNORMAL LOW (ref 0.50–1.10)
Sodium: 137 mEq/L (ref 135–145)

## 2010-12-09 LAB — BLOOD GAS, ARTERIAL
Drawn by: 32131
O2 Content: 2 L/min
O2 Saturation: 93.7 %
Patient temperature: 37
pH, Arterial: 7.46 — ABNORMAL HIGH (ref 7.350–7.400)

## 2010-12-09 LAB — GLUCOSE, CAPILLARY
Glucose-Capillary: 101 mg/dL — ABNORMAL HIGH (ref 70–99)
Glucose-Capillary: 124 mg/dL — ABNORMAL HIGH (ref 70–99)
Glucose-Capillary: 94 mg/dL (ref 70–99)

## 2010-12-09 LAB — DIFFERENTIAL
Basophils Absolute: 0 10*3/uL (ref 0.0–0.1)
Lymphocytes Relative: 37 % (ref 12–46)
Lymphs Abs: 2.6 10*3/uL (ref 0.7–4.0)
Monocytes Absolute: 0.5 10*3/uL (ref 0.1–1.0)
Neutro Abs: 3.7 10*3/uL (ref 1.7–7.7)

## 2010-12-09 LAB — CBC
HCT: 37.2 % (ref 36.0–46.0)
Hemoglobin: 12 g/dL (ref 12.0–15.0)
MCHC: 32.3 g/dL (ref 30.0–36.0)
RDW: 15.4 % (ref 11.5–15.5)
WBC: 6.9 10*3/uL (ref 4.0–10.5)

## 2010-12-09 MED ORDER — IOHEXOL 300 MG/ML  SOLN
100.0000 mL | Freq: Once | INTRAMUSCULAR | Status: AC | PRN
  Administered 2010-12-09: 100 mL via INTRAVENOUS

## 2010-12-09 MED ORDER — TECHNETIUM TC 99M MEBROFENIN IV KIT
5.0000 | PACK | Freq: Once | INTRAVENOUS | Status: AC | PRN
  Administered 2010-12-09: 5 via INTRAVENOUS

## 2010-12-10 ENCOUNTER — Encounter (INDEPENDENT_AMBULATORY_CARE_PROVIDER_SITE_OTHER): Payer: Self-pay | Admitting: Surgery

## 2010-12-10 LAB — CBC
HCT: 40 % (ref 36.0–46.0)
Hemoglobin: 13.3 g/dL (ref 12.0–15.0)
MCV: 85.5 fL (ref 78.0–100.0)
Platelets: 341 10*3/uL (ref 150–400)
RBC: 4.68 MIL/uL (ref 3.87–5.11)
WBC: 9.3 10*3/uL (ref 4.0–10.5)

## 2010-12-10 LAB — BASIC METABOLIC PANEL
BUN: 5 mg/dL — ABNORMAL LOW (ref 6–23)
CO2: 26 mEq/L (ref 19–32)
Chloride: 101 mEq/L (ref 96–112)
GFR calc Af Amer: >60 mL/min (ref >60)
Potassium: 3.3 mEq/L — ABNORMAL LOW (ref 3.5–5.1)

## 2010-12-10 LAB — LIPID PANEL
Cholesterol: 178 mg/dL (ref 0–200)
Triglycerides: 141 mg/dL (ref <150)
VLDL: 28 mg/dL (ref 0–40)

## 2010-12-10 LAB — DIFFERENTIAL
Eosinophils Absolute: 0.3 10*3/uL (ref 0.0–0.7)
Lymphocytes Relative: 34 % (ref 12–46)
Lymphs Abs: 3.1 10*3/uL (ref 0.7–4.0)
Neutro Abs: 5.1 10*3/uL (ref 1.7–7.7)
Neutrophils Relative %: 56 % (ref 43–77)

## 2010-12-10 LAB — GLUCOSE, CAPILLARY

## 2010-12-10 LAB — BILIRUBIN, TOTAL: Total Bilirubin: 0.6 mg/dL (ref 0.3–1.2)

## 2010-12-10 NOTE — Discharge Summary (Signed)
NAMEAMMA, CREAR NO.:  [id number]  MEDICAL RECORD NO.:  [id number]  LOCATION:  1406                         FACILITY:  [facility]  PHYSICIAN:  Talmage Nap, MD  DATE OF BIRTH:  1955/04/19  DATE OF ADMISSION:  12/06/2010 DATE OF DISCHARGE:  12/10/2010                        DISCHARGE SUMMARY - REFERRING   POSSIBLE DATE OF DISCHARGE:  December 10, 2010.  PRIMARY CARE PHYSICIAN:  Kathleen Argue, MD, Family Practice.  GENERAL SURGEON:  Ardeth Sportsman, MD  CONSULTANT:  General Surgery, Dr. Luretha Murphy.  DISCHARGE DIAGNOSES: 1. 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     Abdomen less tender and the patient now moving her bowels. 2. Status post laparoscopic cholecystectomy plus liver biopsy,     questionable hepatic steatosis/cirrhosis. 3. Diabetes mellitus. 4. Hypertension.  HISTORY AND PHYSICAL:  The patient is a 55 year old Caucasian female with history of hypertension and diabetes mellitus who had laparoscopic cholecystectomy with liver biopsy done on November 28, 2010 and postoperatively had been having pain in the right upper quadrant, radiating to the back with shortness of breath.  The patient was also said to have complained about progressive abdominal distention with associated constipation.  She denied any vomiting.  She denied any diarrhea.  She denied any dysuria or hematuria.  She denied any systemic symptoms, i.e., no fever, no chills, no rigor and subsequently presented to the emergency room to be evaluated for further evaluation.  PREADMISSION MEDICATIONS WITHOUT DOSAGES: 1. Vitamin B1. 2. Metformin. 3. Cymbalta. 4. Ibuprofen. 5. Levemir FlexPen. 6. Nexium. 7. Oxycodone/acetaminophen. 8. Pravastatin. 9. Ultram.  ALLERGIES:  LATEX and PENICILLIN.  SOCIAL HISTORY:  Negative for alcohol or tobacco use.  The patient lives at home with her spouse.  PAST SURGICAL HISTORY: 1. Most recently  laparoscopic cholecystectomy with liver biopsy. 2. Ectopic gestation. 3. Status post unilateral salpingo-oophorectomy.  FAMILY HISTORY:  York Spaniel to be positive for diabetes mellitus.  REVIEW OF SYSTEMS:  Essentially as documented in the initial history and physical.  PHYSICAL EXAMINATION:  GENERAL:  At time the patient was seen by me, she was not in any distress.  She was dehydrated. VITAL SIGNS:  Blood pressure was 121/58, pulse 91, respiratory rate 20, temperature 99.2. HEENT:  Mild pallor.  Pupils are reactive to light and extraocular muscles are intact. NECK:  No jugular venous distention.  No carotid bruit.  No lymphadenopathy. CHEST:  Clear to auscultation. HEART:  Heart sounds are 1 and 2. ABDOMEN:  Distended globally.  Organ is difficult to palpate and bowel sounds are absent. EXTREMITIES:  No pedal edema. NEUROLOGIC:  Nonfocal. MUSCULOSKELETAL:  Unremarkable. SKIN:  Slightly decreased turgor.  LAB DATA:  Initial complete blood count with differential showed WBC of 9.1, hemoglobin of 12.9, hematocrit of 39.7, MCV of 87.6 with a platelet count of 277, normal differentials.  Comprehensive metabolic panel showed sodium of 134, potassium of 4.0, chloride of 97 with a bicarb of 28, glucose is 65, BUN is 5, creatinine 0.49.  LFTs showed bilirubin 0.6, alkaline phosphatase of 104, AST 47, ALT 43.  Lipase 47, normal. Urinalysis:  Unremarkable.  Blood culture x2 negative.  Magnesium 2.4. A repeat CBC with differential done on December 09, 2010, showed WBC of 6.9, hemoglobin of 12.0, hematocrit 37.2, MCV 86.5, platelet count of 298 with normal differentials and comprehensive metabolic panel showed sodium of 137, potassium of 3.7, chloride of 101 with a bicarb of 27, glucose is 95, BUN is 4, creatinine is less than 0.47.  Repeat LFTs done showed total bilirubin 0.8, alkaline phosphatase 96, AST 36, ALT 16, essentially normal.  Magnesium level is 2.3.  Arterial blood gas done showed  pH of 7.460, pCO2 of 35.4, pO2 of 68.3 with a bicarb of 24.8.  IMAGING STUDIES: 1. Acute abdominal series which basically showed unremarkable bowel     gas pattern.  No free intraperitoneal abdominal air seen.  There is     questionable minimal air at the gallbladder fossa; this may be     postoperative, and there is mild bibasilar air space opacification     which may likely reflect atelectasis. 2. CT of the abdomen done on December 06, 2010, shows small amount of     fluid in the gallbladder and small amount of perihepatic ascites;     this is nonspecific.  And then there is stable focal hepatic     steatosis involving the lateral right lobe of the liver.  There is     also possible constipation.  And bilateral lower lobe atelectasis. 3. CT angiogram showed no evidence of significant pulmonary embolism.     There was diffuse prominence of the interstitium which  could     represent mild interstitial edema and also hypoventilated lungs.     There is mildly scattered coronary artery calcification and 1.5 cm     peripherally calcified hypodensity in the inferior aspect of the     right thyroid lobe.  There is trace amount of fluid noted in the     liver which may reflect recent cholecystectomy. 4. HIDA scan showed no evidence of bile leak. 5. A repeat CT angiogram done with contrast on December 09, 2010,     showed no evidence of pulmonary embolism.  There are chronic lung     changes suggestive for emphysema.  There is mild interstitial     thickening with areas of atelectasis or scarring in the lower lobe.     There is small amount of perihepatic ascites.  This may he hepatic     steatosis.  Etiology of abdominal ascites is unknown and cirrhosis     cannot be excluded.  There are a few nodular densities along the     periphery of the left upper lobe which are nonspecific.  HOSPITAL COURSE:  The patient was admitted to the general medical floor. She had NG tube inserted with  intermittent low-pressure suctioning and this was eventually discontinued.  She was also given half-normal saline IV to go at rate of 100 mL an hour.  The patient was placed on Protonix 40 mg IV q.24 for GI prophylaxis, and Dilaudid 1 mg IV q.4 p.r.n. for pain, and SCDs boots or TED stockings for DVT prophylaxis.  I had an extensive discussion with the on-call surgeon, Dr. Daphine Deutscher, who agreed that this patient abdominal distention was unlikely to be due to infective etiology.  The patient was subsequently given neostigmine 1 mg IV x1 dose and this was subsequently repeated the following day and thereafter the patient was able to move her bowels without any difficulty.  She was also given Fleet's enemas as well.  Following evaluation by the surgeon, Dr. Daphine Deutscher, he ordered a HIDA  scan and the result essentially was unremarkable.  The patient has so far been followed and evaluated by me.  She was seen by me today, which is December 09, 2010, was said to have been slightly lethargic but examination of the patient was essentially unremarkable.  The vital signs have remained stable, blood pressure is 147/80, temperature is 98.6, pulse is 81, respiratory rate 16, and abdominal examination showed resolution of distention, i.e., no more abdominal distention and bowel sounds are present.  So far, the patient has remained clinically stable.  DISCHARGE PLAN:  I had an extensive discussion with the family which includes the patient's daughter, the patient herself and the spouse and they all verbalized understanding.  The plan is for the patient to be discharged home tomorrow, which is December 10, 2010, on activity as tolerated, diabetic ADA diet, and to be followed up by her primary care physician as well as by the surgeon in 1 to 2 weeks.  MEDICATION TO BE TAKEN AT HOME: 1. Bisacodyl (Dulcolax) 10 mg rectally q.12 p.r.n. 2. Polyethylene glycol 3350 powder (MiraLAX) 17 g q.12 p.r.n. 3. Cymbalta  (duloxetine) 60 mg 1 capsule p.o. q.a.m. 4. Diphenhydramine 50 mg 1 tablet daily at bedtime p.r.n. for     insomnia. 5. Fluocinonide topical 0.05% twice a day p.r.n. 6. Hydromorphone 2 mg 1 tablet p.o. q.4 p.r.n. for pain. 7. Levemir (insulin detemir) 140 units subcu q.a.m. 8. Metformin 1000 mg 1 p.o. b.i.d. 9. Nexium (esomeprazole) 40 mg p.o. q.a.m. 10.Pravachol (pravastatin) 20 mg 1 p.o. daily at bedtime. 11.Promethazine 25 mg 1 p.o. q.6 p.r.n. for nausea. 12.Vitamin D3 1000 units 1 capsule p.o. daily. 13.WelChol (colesevelam) 3 capsules by mouth twice a day. 14.Zetia (ezetimibe) 10 mg 1 p.o. daily at bedtime.     Talmage Nap, MD     CN/MEDQ  D:  12/09/2010  T:  12/09/2010  Job:  [id number]  cc:   Caryn Bee L. Little, M.D. Fax: [phone]  Electronically Signed by Talmage Nap  on 12/10/2010 06:57:12 AM

## 2010-12-10 NOTE — Discharge Summary (Signed)
  [name], [name] NO.:  [id number]  MEDICAL RECORD NO.:  [id number]  LOCATION:  1606                         FACILITY:  [facility]  PHYSICIAN:  Talmage Nap, MD  DATE OF BIRTH:  Aug 20, 1955  DATE OF ADMISSION:  12/06/2010 DATE OF DISCHARGE:  12/10/2010                        DISCHARGE SUMMARY - REFERRING   ADDENDUM:  For discharge diagnoses and discharge medication and instructions, see my discharge summary dictated on December 09, 2010. The patient was; however, seen by me today which is December 10, 2010, feels better, denies any complaint.  No abdominal distention.  No abdominal pain.  The patient moving her bowels moved about three to four times today.  Her vital signs:  Blood pressure is 134/65, pulse 80, respiratory rate 16, temperature is 98.4.  Labs prior to discharge include ammonia level 28.  Lipid panel showed total cholesterol 178, triglycerides 141, HDL cholesterol 57, LDL cholesterol 123, VLDL cholesterol 28, total bilirubin 0.6.  Basic metabolic panel showed sodium of 138, potassium of 3.3, chloride of 101 with a bicarb of 26, glucose is 126, BUN is 5, creatinine 0.51.  Complete blood count with no differential showed WBC of 9.3, hemoglobin of 13.3, hematocrit 40.0, MCV of 85.5, platelet count of 341.  The patient is medically stable.  Plan is for the patient to be discharged home.  Please see my discharge summary, which include diagnosis as well as medication dated December 09, 2010, however, additional medication to be added to the patient's meds will include KCl 10 mEq p.o. b.i.d.  A copy of this addendum to the discharge summary will be made available to the patient's primary care physician.     Talmage Nap, MD     CN/MEDQ  D:  12/10/2010  T:  12/10/2010  Job:  [id number]  Electronically Signed by Talmage Nap  on 12/10/2010 04:49:35 PM

## 2010-12-16 ENCOUNTER — Encounter (INDEPENDENT_AMBULATORY_CARE_PROVIDER_SITE_OTHER): Payer: Self-pay | Admitting: Surgery

## 2010-12-16 ENCOUNTER — Ambulatory Visit (INDEPENDENT_AMBULATORY_CARE_PROVIDER_SITE_OTHER): Payer: Managed Care, Other (non HMO) | Admitting: Surgery

## 2010-12-16 VITALS — BP 138/82 | HR 80 | Temp 97.6°F | Resp 20 | Ht 64.0 in | Wt 198.1 lb

## 2010-12-16 DIAGNOSIS — K7460 Unspecified cirrhosis of liver: Secondary | ICD-10-CM

## 2010-12-16 DIAGNOSIS — K811 Chronic cholecystitis: Secondary | ICD-10-CM

## 2010-12-16 DIAGNOSIS — K7689 Other specified diseases of liver: Secondary | ICD-10-CM

## 2010-12-16 DIAGNOSIS — R1012 Left upper quadrant pain: Secondary | ICD-10-CM

## 2010-12-16 DIAGNOSIS — R109 Unspecified abdominal pain: Secondary | ICD-10-CM

## 2010-12-16 DIAGNOSIS — K7581 Nonalcoholic steatohepatitis (NASH): Secondary | ICD-10-CM

## 2010-12-16 MED ORDER — OXYCODONE HCL 5 MG PO TABS: 5.0000 mg | ORAL_TABLET | Freq: Four times a day (QID) | ORAL | Status: DC | PRN

## 2010-12-16 NOTE — Patient Instructions (Addendum)
Cirrhosis Scarring of the Liver Cirrhosis is a condition of scarring of the liver which is caused when the liver has tried repairing itself following damage. This damage may come from a previous infection such as one of the forms of hepatitis (usually Hepatitis C), or the damage may come from being injured by toxins. The main toxin to do this is alcohol. The scarring of the liver from use of alcohol is irreversible. That means the liver cannot return to normal even though alcohol is not used any more. The main danger of Hepatitis C infection is that it may cause chronic (longstanding) liver disease, and this also may lead to cirrhosis (scarring of the liver). This complication is progressive and irreversible. CAUSES Prior to available blood tests, Hepatitis C could be contracted by blood transfusions. Since testing of blood has improved, this is now unlikely. This infection can also be contracted through intravenous drug use and the sharing of needles. It can also be contracted through physical (sexual) relationships. The injury caused by alcohol comes from too much use. It is not a few drinks that poison the liver, but years of misuse. Usually there will be some signs and symptoms (problems) early with scarring of the liver that suggest the development of better habits. Alcohol should never be used while using Tylenol (acetaminophen). A small dose of both taken together may cause irreversible damage to the liver. HOME CARE INSTRUCTIONS There is no specific treatment for cirrhosis; however there are things you can do to avoid making the condition worse.  Rest as needed.   Eat a well balanced diet. Your caregiver can help you with suggestions on this.   Vitamin supplements including vitamins A, K, D, and thiamine can help.   A low salt diet, water restriction, or diuretic medicine may be needed to reduce fluid retention.   Avoid alcohol. This can be an extremely toxic if combined with acetaminophen  (Tylenol).   Avoid drugs which are toxic to the liver. Some of these are: Isoniazid, Methyldopa (Aldomet) acetaminophen (Tylenol), anabolic steroids (muscle building drugs), erythromycin, and oral contraceptives (birth control pills). Check with your caregiver to make sure medications you are presently taking will not be harmful.   Periodic blood tests may be required. Follow your caregiver's advice regarding the timing of these.   Milk thistle is an herbal remedy which does protect the liver against toxins; however it will not help once the liver has been scarred.  SEEK MEDICAL CARE IF:  You have increasing fatigue or weakness.   You develop swelling of the hands, feet, legs or face.   You vomit bright red blood, or a coffee ground appearing material.   You have blood in your stools, or the stools turn black and tarry.   An oral temperature above 102F develops.   You develop loss of appetite, or have nausea (feeling sick to your stomach) and vomiting.   You develop jaundice.   You develop easy bruising or bleeding.   You have worsening of any of the problems you are concerned about.  Document Released: 03/03/2005 Document Re-Released: 05/30/2008 [facility] Patient Information 2011 [place], [place].  GETTING TO GOOD BOWEL HEALTH. Irregular bowel habits such as constipation and diarrhea can lead to many problems over time.  Having one soft bowel movement a day is the most important way to prevent further problems.  The anorectal canal is designed to handle stretching and feces to safely manage our ability to get rid of solid waste (feces, poop, stool)  out of our body.  BUT, hard constipated stools can act like ripping concrete bricks and diarrhea can be a burning fire to this very sensitive area of our body, causing inflamed hemorrhoids, anal fissures, increasing risk is perirectal abscesses, abdominal pain/bloating, an making irritable bowel worse.     The goal: ONE SOFT BOWEL MOVEMENT  A DAY!  To have soft, regular bowel movements:    Drink at least 8 tall glasses of water a day.     Take plenty of fiber.  Fiber is the undigested part of plant food that passes into the colon, acting s "natures broom" to encourage bowel motility and movement.  Fiber can absorb and hold large amounts of water. This results in a larger, bulkier stool, which is soft and easier to pass. Work gradually over several weeks up to 6 servings a day of fiber (25g a day even more if needed) in the form of: o Vegetables -- Root (potatoes, carrots, turnips), leafy green (lettuce, salad greens, celery, spinach), or cooked high residue (cabbage, broccoli, etc) o Fruit -- Fresh (unpeeled skin & pulp), Dried (prunes, apricots, cherries, etc ),  or stewed ( applesauce)  o Whole grain breads, pasta, etc (whole wheat)  o Bran cereals    Bulking Agents -- This type of water-retaining fiber generally is easily obtained each day by one of the following:  o Psyllium bran -- The psyllium plant is remarkable because its ground seeds can retain so much water. This product is available as Metamucil, Konsyl, Effersyllium, Per Diem Fiber, or the less expensive generic preparation in drug and health food stores. Although labeled a laxative, it really is not a laxative.  o Methylcellulose -- This is another fiber derived from wood which also retains water. It is available as Citrucel. o Polyethylene Glycol - and "artificial" fiber commonly called Miralax or Glycolax.  It is helpful for people with gassy or bloated feelings with regular fiber o Flax Seed - a less gassy fiber than psyllium   No reading or other relaxing activity while on the toilet. If bowel movements take longer than 5 minutes, you are too constipated   AVOID CONSTIPATION.  High fiber and water intake usually takes care of this.  Sometimes a laxative is needed to stimulate more frequent bowel movements, but    Laxatives are not a good long-term solution as it can wear  the colon out. o Osmotics (Milk of Magnesia, Fleets phosphosoda, Magnesium citrate, MiraLax, GoLytely) are safer than  o Stimulants (Senokot, Castor Oil, Dulcolax, Ex Lax)    o Do not take laxatives for more than 7days in a row.    IF SEVERELY CONSTIPATED, try a Bowel Retraining Program: o Do not use laxatives.  o Eat a diet high in roughage, such as bran cereals and leafy vegetables.  o Drink six (6) ounces of prune or apricot juice each morning.  o Eat two (2) large servings of stewed fruit each day.  o Take one (1) heaping tablespoon of a psyllium-based bulking agent twice a day. Use sugar-free sweetener when possible to avoid excessive calories.  o Eat a normal breakfast.  o Set aside 15 minutes after breakfast to sit on the toilet, but do not strain to have a bowel movement.  o If you do not have a bowel movement by the third day, use an enema and repeat the above steps.    Controlling diarrhea o Switch to liquids and simpler foods for a few days to avoid stressing  your intestines further. o Avoid dairy products (especially milk & ice cream) for a short time.  The intestines often can lose the ability to digest lactose when stressed. o Avoid foods that cause gassiness or bloating.  Typical foods include beans and other legumes, cabbage, broccoli, and dairy foods.  Every person has some sensitivity to other foods, so listen to our body and avoid those foods that trigger problems for you. o Adding fiber (Citrucel, Metamucil, psyllium, Miralax) gradually can help thicken stools by absorbing excess fluid and retrain the intestines to act more normally.  Slowly increase the dose over a few weeks.  Too much fiber too soon can backfire and cause cramping & bloating. o Probiotics (such as active yogurt, Align, etc) may help repopulate the intestines and colon with normal bacteria and calm down a sensitive digestive tract.  Most studies show it to be of mild help, though, and such products can be  costly. o Medicines:   Bismuth subsalicylate (ex. Kayopectate, Pepto Bismol) every 30 minutes for up to 6 doses can help control diarrhea.  Avoid if pregnant.   Loperamide (Immodium) can slow down diarrhea.  Start with two tablets (4mg  total) first and then try one tablet every 6 hours.  Avoid if you are having fevers or severe pain.  If you are not better or start feeling worse, stop all medicines and call your doctor for advice o Call your doctor if you are getting worse or not better.  Sometimes further testing (cultures, endoscopy, X-ray studies, bloodwork, etc) may be needed to help diagnose and treat the cause of the diarrhea.

## 2010-12-16 NOTE — Progress Notes (Signed)
Subjective:     Patient ID: [name], female   DOB: Apr 17, 1955, 55 y.o.   MRN: [id number]  HPI  Patient Care Team: [name] as PCP - General (Family Medicine) Freddy Jaksch, MD as Consulting Physician (Gastroenterology) Talmage Coin as Consulting Physician (Endocrinology)  This patient is a 55 y.o.female who presents today for surgical evaluation.   Diagnosis: NASH and cirrhosis. Diastases recti. No ventral wall hernias. Chronic cholecystitis.  Procedure: Diagnostic laparoscopy. Core liver biopsy. Laparoscopic cholecystectomy intraoperative cholangiogram. 11/28/10  Pathology: No gallbladder polyp. No calculi. Liver: 7/8 NASH score, Cirrhosis with bridging fibrosis. Negative heme stain. Negative Alpha I antitrypsin stain  Reason for visit: Followup.  The patient comes a feeling better. She returned with abdominal pain to the hospital about a week and a half postop. She did start with pain issues. She had a negative CAT scan. She a negative HIDA scan. She had no new cultures. Her liver function tests were minimally elevated. She eventually proved improved.  She still has soreness on her right lateral chest wall along her lower rib cage. It is difficult to lie on her side. She no longer hurts at the needle biopsy puncture site. Other lap abdominal  incisions do not bother her. She's eating all right. No nausea or vomiting. Bowels moving about every other day. Taking MiraLax 1-2 times a day.  She still gets some crampy right-sided abdominal pain. She claims that it is colicky, sudden and intense. Oxycodone worked better than the tramadol that she is currently using. She is afraid if any, with her liver issues. She's had some issues of bleeding and oozing with NSAIDS.Occasionally on the left side as well. She comes today with her daughter. No fevers or chills.    Past Medical History  Diagnosis Date  . Hypertension   . Diabetes mellitus   . Hyperlipidemia   . Fatigue     . Night sweats   . Wears dentures   . Poor appetite   . Retinopathy   . Arthritis   . Rash   . Bruises easily   . Colon polyp   . Hernia   . Rectal bleeding   . Hemorrhoid   . Family history of breast cancer   . Abdominal pain     spasms radiating around to the right side of abdomen   . Shortness of breath   . Night sweats     Past Surgical History  Procedure Date  . Cholecystectomy 11/28/10  . Liver biopsy Sep 2012    History   Social History  . Marital Status: Married    Spouse Name: N/A    Number of Children: N/A  . Years of Education: N/A   Occupational History  . Not on file.   Social History Main Topics  . Smoking status: Former Smoker    Quit date: 08/21/2005  . Smokeless tobacco: Not on file  . Alcohol Use: No  . Drug Use: No  . Sexually Active: Not on file   Other Topics Concern  . Not on file   Social History Narrative  . No narrative on file    Family History  Problem Relation Age of Onset  . Cancer Mother   . Hypertension Mother   . Hyperlipidemia Father   . Iron deficiency Sister   . Heart disease Brother   . Hyperlipidemia Brother     Current outpatient prescriptions:B-D ULTRAFINE III SHORT PEN 31G X 8 MM MISC, , Disp: , Rfl: ;  Cholecalciferol (VITAMIN D-3 PO), Take by mouth. 1000 IV , Disp: , Rfl: ;  colesevelam (WELCHOL) 625 MG tablet, Take 1,875 mg by mouth 2 (two) times daily. Three pills taken per dosage. Total of six per day. , Disp: , Rfl: ;  DULoxetine (CYMBALTA) 60 MG capsule, Take 60 mg by mouth daily.  , Disp: , Rfl:  esomeprazole (NEXIUM) 40 MG capsule, Take 40 mg by mouth daily before breakfast.  , Disp: , Rfl: ;  ezetimibe (ZETIA) 10 MG tablet, Take 10 mg by mouth daily.  , Disp: , Rfl: ;  FREESTYLE LITE test strip, , Disp: , Rfl: ;  gabapentin (NEURONTIN) 300 MG capsule, , Disp: , Rfl: ;  HYDROmorphone (DILAUDID) 2 MG tablet, , Disp: , Rfl: ;  Insulin Detemir (LEVEMIR [place]), Inject 140 mg into the skin daily.  , Disp: , Rfl:   metFORMIN (GLUMETZA) 1000 MG (MOD) 24 hr tablet, Take 1,000 mg by mouth 2 (two) times daily with a meal.  , Disp: , Rfl: ;  ondansetron (ZOFRAN-ODT) 8 MG disintegrating tablet, , Disp: , Rfl: ;  polyethylene glycol powder (GLYCOLAX/MIRALAX) powder, , Disp: , Rfl: ;  POTASSIUM PO, Take by mouth daily.  , Disp: , Rfl: ;  pravastatin (PRAVACHOL) 20 MG tablet, Take 20 mg by mouth daily.  , Disp: , Rfl: ;  traMADol (ULTRAM) 50 MG tablet, , Disp: , Rfl:   Allergies  Allergen Reactions  . Penicillins Hives and Itching    All over the body.       Review of Systems  Constitutional: Negative for fever, chills, diaphoresis, appetite change and fatigue.  HENT: Negative for ear pain, sore throat, trouble swallowing, neck pain and ear discharge.   Eyes: Negative for photophobia, discharge and visual disturbance.  Respiratory: Negative for cough, choking, chest tightness and shortness of breath.   Cardiovascular: Negative for chest pain, palpitations and leg swelling.  Gastrointestinal: Positive for abdominal pain. Negative for nausea, vomiting, diarrhea, constipation, blood in stool, abdominal distention, anal bleeding and rectal pain.  Genitourinary: Negative for dysuria, frequency and difficulty urinating.  Musculoskeletal: Negative for myalgias and gait problem.  Skin: Negative for color change, pallor, rash and wound.  Neurological: Negative for dizziness, speech difficulty, weakness and numbness.  Hematological: Negative for adenopathy.  Psychiatric/Behavioral: Negative for confusion and agitation. The patient is not nervous/anxious.        Objective:   Physical Exam  Constitutional: She is oriented to person, place, and time. She appears well-developed and well-nourished. No distress.  HENT:  Head: Normocephalic.  Mouth/Throat: Oropharynx is clear and moist. No oropharyngeal exudate.  Eyes: Conjunctivae and EOM are normal. Pupils are equal, round, and reactive to light. No scleral icterus.   Neck: Normal range of motion. Neck supple. No tracheal deviation present.  Cardiovascular: Normal rate, regular rhythm and intact distal pulses.   Pulmonary/Chest: Effort normal. No respiratory distress. She has no wheezes. She has rales.  Abdominal: Soft. She exhibits no distension and no mass. There is no tenderness. Hernia confirmed negative in the right inguinal area and confirmed negative in the left inguinal area.       Incisions clean with normal healing ridges.  No hernias.  No pain at incisions.  Mild LUQ>RUQ TTP, esp Right lateral chest wall.    Genitourinary: No vaginal discharge found.  Musculoskeletal: Normal range of motion. She exhibits no tenderness.  Lymphadenopathy:    She has no cervical adenopathy.       Right: No inguinal adenopathy  present.       Left: No inguinal adenopathy present.  Neurological: She is alert and oriented to person, place, and time. No cranial nerve deficit. She exhibits normal muscle tone. Coordination normal.  Skin: Skin is warm and dry. No rash noted. She is not diaphoretic. No erythema.  Psychiatric: She has a normal mood and affect. Her behavior is normal. Judgment and thought content normal.       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  Right-sided chest wall and abdominal pain.  Chronic constipation.    Plan:     I feel reassured them that she has no major bie duct issue. Liver function tests were normal. HIDA scan and CT scan showed no leak nor any abscess. I feel reassured that her soreness has gone down at her incisions and puncture sites.  She has had a fair amount of the colon stool burden on the right side. I recommend she control constipation better by increasing the MiraLax to 3-4 doses a day until she's having one daily bowel movement. I think that could contribute to her abdominal cramping and discomfort.  I suspect the liver  cirrhosis is giving her pain and discomfort as well. I gave her oxycodone #50. I hesitate to give more beyond this.  Use heating pad and ice packs as well.  I strongly recommend she followup with gastroenterology to see what treatment options are available with this new diagnosis of cirrhosis. They are going to downstairs to schedule appointment with Dr. Dulce Sellar whom has seen her in the past.  Return to clinic p.r.n. I'm glad she's feeling better, I am hopeful that the other she's will improve. She felt reassured. She and her daughter expressed understanding and appreciation

## 2011-05-05 ENCOUNTER — Other Ambulatory Visit: Payer: Self-pay | Admitting: Gastroenterology

## 2011-05-05 DIAGNOSIS — R1011 Right upper quadrant pain: Secondary | ICD-10-CM

## 2011-05-08 ENCOUNTER — Ambulatory Visit
Admission: RE | Admit: 2011-05-08 | Discharge: 2011-05-08 | Disposition: A | Discharge status: Home / Self Care | Payer: Managed Care, Other (non HMO) | Source: Ambulatory Visit | Attending: Gastroenterology | Admitting: Gastroenterology

## 2011-05-08 DIAGNOSIS — R1011 Right upper quadrant pain: Secondary | ICD-10-CM

## 2011-07-06 ENCOUNTER — Encounter (HOSPITAL_COMMUNITY): Payer: Self-pay | Admitting: *Deleted

## 2011-07-06 ENCOUNTER — Emergency Department (HOSPITAL_COMMUNITY)
Admission: EM | Admit: 2011-07-06 | Discharge: 2011-07-06 | Disposition: A | Discharge status: Home / Self Care | Payer: Managed Care, Other (non HMO) | Attending: Emergency Medicine | Admitting: Emergency Medicine

## 2011-07-06 ENCOUNTER — Emergency Department (HOSPITAL_COMMUNITY): Payer: Managed Care, Other (non HMO)

## 2011-07-06 DIAGNOSIS — R143 Flatulence: Secondary | ICD-10-CM | POA: Insufficient documentation

## 2011-07-06 DIAGNOSIS — K625 Hemorrhage of anus and rectum: Secondary | ICD-10-CM | POA: Insufficient documentation

## 2011-07-06 DIAGNOSIS — R110 Nausea: Secondary | ICD-10-CM | POA: Insufficient documentation

## 2011-07-06 DIAGNOSIS — R142 Eructation: Secondary | ICD-10-CM | POA: Insufficient documentation

## 2011-07-06 DIAGNOSIS — R1084 Generalized abdominal pain: Secondary | ICD-10-CM | POA: Insufficient documentation

## 2011-07-06 DIAGNOSIS — E119 Type 2 diabetes mellitus without complications: Secondary | ICD-10-CM | POA: Insufficient documentation

## 2011-07-06 DIAGNOSIS — M129 Arthropathy, unspecified: Secondary | ICD-10-CM | POA: Insufficient documentation

## 2011-07-06 DIAGNOSIS — E785 Hyperlipidemia, unspecified: Secondary | ICD-10-CM | POA: Insufficient documentation

## 2011-07-06 DIAGNOSIS — K7460 Unspecified cirrhosis of liver: Secondary | ICD-10-CM | POA: Insufficient documentation

## 2011-07-06 DIAGNOSIS — I10 Essential (primary) hypertension: Secondary | ICD-10-CM | POA: Insufficient documentation

## 2011-07-06 DIAGNOSIS — Z79899 Other long term (current) drug therapy: Secondary | ICD-10-CM | POA: Insufficient documentation

## 2011-07-06 DIAGNOSIS — R141 Gas pain: Secondary | ICD-10-CM | POA: Insufficient documentation

## 2011-07-06 HISTORY — DX: Hepatic encephalopathy: K76.82

## 2011-07-06 HISTORY — DX: Fatty (change of) liver, not elsewhere classified: K76.0

## 2011-07-06 HISTORY — DX: Unspecified cirrhosis of liver: K74.60

## 2011-07-06 HISTORY — DX: Hepatic failure, unspecified without coma: K72.90

## 2011-07-06 LAB — CBC
HCT: 32.8 % — ABNORMAL LOW (ref 36.0–46.0)
MCH: 24.5 pg — ABNORMAL LOW (ref 26.0–34.0)
MCHC: 31.1 g/dL (ref 30.0–36.0)
MCV: 78.7 fL (ref 78.0–100.0)
RDW: 17 % — ABNORMAL HIGH (ref 11.5–15.5)

## 2011-07-06 LAB — URINALYSIS, ROUTINE W REFLEX MICROSCOPIC
Ketones, ur: NEGATIVE mg/dL
Leukocytes, UA: NEGATIVE
Nitrite: NEGATIVE
pH: 6 (ref 5.0–8.0)

## 2011-07-06 LAB — DIFFERENTIAL
Basophils Absolute: 0 10*3/uL (ref 0.0–0.1)
Basophils Relative: 0 % (ref 0–1)
Eosinophils Absolute: 0.1 10*3/uL (ref 0.0–0.7)
Eosinophils Relative: 1 % (ref 0–5)
Monocytes Absolute: 0.7 10*3/uL (ref 0.1–1.0)

## 2011-07-06 LAB — COMPREHENSIVE METABOLIC PANEL
AST: 47 U/L — ABNORMAL HIGH (ref 0–37)
Albumin: 3.2 g/dL — ABNORMAL LOW (ref 3.5–5.2)
CO2: 23 mEq/L (ref 19–32)
Calcium: 8.7 mg/dL (ref 8.4–10.5)
Creatinine, Ser: 0.37 mg/dL — ABNORMAL LOW (ref 0.50–1.10)
GFR calc non Af Amer: >90 mL/min (ref >90)
Total Protein: 7.4 g/dL (ref 6.0–8.3)

## 2011-07-06 LAB — URINE MICROSCOPIC-ADD ON

## 2011-07-06 LAB — LACTIC ACID, PLASMA: Lactic Acid, Venous: 2.7 mmol/L — ABNORMAL HIGH (ref 0.5–2.2)

## 2011-07-06 MED ORDER — OXYCODONE-ACETAMINOPHEN 7.5-325 MG PO TABS: 1.0000 | ORAL_TABLET | ORAL | Status: AC | PRN

## 2011-07-06 MED ORDER — SODIUM CHLORIDE 0.9 % IV BOLUS (SEPSIS)
1000.0000 mL | Freq: Once | INTRAVENOUS | Status: AC
  Administered 2011-07-06: 1000 mL via INTRAVENOUS

## 2011-07-06 MED ORDER — IOHEXOL 300 MG/ML  SOLN
100.0000 mL | Freq: Once | INTRAMUSCULAR | Status: AC | PRN
  Administered 2011-07-06: 100 mL via INTRAVENOUS

## 2011-07-06 MED ORDER — FENTANYL CITRATE 0.05 MG/ML IJ SOLN
100.0000 ug | Freq: Once | INTRAMUSCULAR | Status: AC
  Administered 2011-07-06: 100 ug via INTRAVENOUS
  Filled 2011-07-06: qty 2

## 2011-07-06 MED ORDER — ONDANSETRON HCL 4 MG/2ML IJ SOLN
4.0000 mg | Freq: Once | INTRAMUSCULAR | Status: AC
  Administered 2011-07-06: 4 mg via INTRAVENOUS
  Filled 2011-07-06: qty 2

## 2011-07-06 MED ORDER — SODIUM CHLORIDE 0.9 % IV SOLN
Freq: Once | INTRAVENOUS | Status: AC
  Administered 2011-07-06: 14:00:00 via INTRAVENOUS

## 2011-07-06 NOTE — ED Notes (Signed)
md at bedside

## 2011-07-06 NOTE — ED Notes (Signed)
Pt blood sugar checked, pt has diabetes and starting to get a headache, and generally that happens when pts blood sugar gets high.

## 2011-07-06 NOTE — ED Notes (Signed)
PT c/o generalized abdominal pain and nausea. Pt is also c/o rectal bleeding. Pt has hx of stage 1 NAFLD and hepatic encephalopathy.

## 2011-07-06 NOTE — ED Notes (Signed)
Pt alert and oriented x4. Respirations even and unlabored, bilateral symmetrical rise and fall of chest. Skin warm and dry. In no acute distress. Denies needs.   

## 2011-07-06 NOTE — ED Notes (Addendum)
Pt to bathroom

## 2011-07-06 NOTE — ED Notes (Signed)
Patient transported to CT. Will get labs when pt returns to room.

## 2011-07-06 NOTE — ED Provider Notes (Signed)
History     CSN: [id number]  Arrival date & time 07/06/11  4098   First MD Initiated Contact with Patient 07/06/11 [phone]      Chief Complaint  Patient presents with  . Abdominal Pain    HPI PT c/o generalized abdominal pain and nausea. Pt is also c/o rectal bleeding. Pt has hx of stage 1 NAFLD and hepatic encephalopathy.  Patient denies fever, constipation, chills, dysuria, frequency.  Patient had a normal meal last.  The abdomen appears mildly enlarged over its normal appearance.  Past Medical History  Diagnosis Date  . Hypertension   . Diabetes mellitus   . Hyperlipidemia   . Fatigue   . Night sweats   . Wears dentures   . Poor appetite   . Retinopathy   . Arthritis   . Rash   . Bruises easily   . Colon polyp   . Hernia   . Rectal bleeding   . Hemorrhoid   . Family history of breast cancer   . Abdominal pain     spasms radiating around to the right side of abdomen   . Shortness of breath   . Night sweats   . Hepatic encephalopathy   . Non-alcoholic fatty liver disease   . Liver cirrhosis     Past Surgical History  Procedure Date  . Cholecystectomy 11/28/10  . Liver biopsy Sep 2012  . Hernia repair     belly button    Family History  Problem Relation Age of Onset  . Cancer Mother   . Hypertension Mother   . Hyperlipidemia Father   . Iron deficiency Sister   . Heart disease Brother   . Hyperlipidemia Brother     History  Substance Use Topics  . Smoking status: Former Smoker    Quit date: 08/21/2005  . Smokeless tobacco: Not on file  . Alcohol Use: No    OB History    Grav Para Term Preterm Abortions TAB SAB Ect Mult Living                  Review of Systems  All other systems reviewed and are negative.    Allergies  Latex and Penicillins  Home Medications   Current Outpatient Rx  Name Route Sig Dispense Refill  . COLESEVELAM HCL 625 MG PO TABS Oral Take 1,875 mg by mouth 2 (two) times daily. Three pills taken per dosage. Total of  six per day.     . DULOXETINE HCL 60 MG PO CPEP Oral Take 60 mg by mouth daily.      [name] Kitchen ESOMEPRAZOLE MAGNESIUM 40 MG PO CPDR Oral Take 40 mg by mouth daily before breakfast.     . EZETIMIBE 10 MG PO TABS Oral Take 10 mg by mouth daily.      . INSULIN DETEMIR 100 UNIT/ML [place] SOLN Subcutaneous Inject 140 Units into the skin daily.    [name] Kitchen METFORMIN HCL ER (MOD) 1000 MG PO TB24 Oral Take 1,000 mg by mouth 2 (two) times daily with a meal.      . POLYETHYLENE GLYCOL 3350 PO PACK Oral Take 17 g by mouth as needed. Constipation    . OXYCODONE-ACETAMINOPHEN 7.5-325 MG PO TABS Oral Take 1 tablet by mouth every 4 (four) hours as needed for pain. 30 tablet 0    BP 116/50  Pulse 97  Temp(Src) 98.3 F (36.8 C) (Oral)  Resp 16  SpO2 93%  Physical Exam  Nursing note and vitals reviewed. Constitutional: She  is oriented to person, place, and time. She appears well-developed and well-nourished. No distress.  HENT:  Head: Normocephalic and atraumatic.  Eyes: Pupils are equal, round, and reactive to light. No scleral icterus.  Neck: Normal range of motion.  Cardiovascular: Normal rate and intact distal pulses.   Pulmonary/Chest: No respiratory distress.  Abdominal: Normal appearance. She exhibits distension. There is tenderness. There is no rebound and no guarding.  Musculoskeletal: Normal range of motion.  Neurological: She is alert and oriented to person, place, and time. No cranial nerve deficit.  Skin: Skin is warm and dry. No rash noted.  Psychiatric: She has a normal mood and affect. Her behavior is normal.    ED Course  Procedures (including critical care time)  Labs Reviewed  CBC - Abnormal; Notable for the following:    Hemoglobin 10.2 (*)    HCT 32.8 (*)    MCH 24.5 (*)    RDW 17.0 (*)    All other components within normal limits  COMPREHENSIVE METABOLIC PANEL - Abnormal; Notable for the following:    Potassium 3.4 (*)    Glucose, Bld 132 (*)    Creatinine, Ser 0.37 (*)    Albumin 3.2  (*)    AST 47 (*)    Total Bilirubin 1.5 (*)    All other components within normal limits  LACTIC ACID, PLASMA - Abnormal; Notable for the following:    Lactic Acid, Venous 2.7 (*)    All other components within normal limits  URINALYSIS, ROUTINE W REFLEX MICROSCOPIC - Abnormal; Notable for the following:    Hgb urine dipstick SMALL (*)    All other components within normal limits  GLUCOSE, CAPILLARY - Abnormal; Notable for the following:    Glucose-Capillary 101 (*)    All other components within normal limits  DIFFERENTIAL  LIPASE, BLOOD  URINE MICROSCOPIC-ADD ON  LAB REPORT - SCANNED  SPECIMEN HOLD   Ct Abdomen Pelvis W Contrast  07/06/2011  *RADIOLOGY REPORT*  Clinical Data: Abdominal pain and nausea.  Rectal bleeding.  Stage I non alcoholic fatty liver disease.  Hepatic encephalopathy.  CT ABDOMEN AND PELVIS WITH CONTRAST  Technique:  Multidetector CT imaging of the abdomen and pelvis was performed following the standard protocol during bolus administration of intravenous contrast.  Contrast: OMNIPAQUE IOHEXOL 300 MG/ML  SOLN  Comparison: ultrasound 05/08/2011.  CT of 12/06/2010 and MRI 06/29/2010.  Findings: Clear lung bases.  Mild cardiomegaly without pericardial or pleural effusion.  Distal esophageal wall thickening on image 11.  Moderate to marked cirrhosis.  Splenomegaly, 15.1 cm cranial caudal, 13.2 x 7.9 cm transverse.  Normal stomach, pancreas. Cholecystectomy without biliary ductal dilatation.  Hepatic and portal veins patent.  Omental collaterals, suggesting portal venous hypertension.    A 1.0 cm preaortic node on image 34 is unchanged. 1.7 cm portacaval node on image 30 measures 1.6 cm on the prior.  Normal colon, appendix, and terminal ileum.  Normal small bowel without abdominal ascites.  No pelvic adenopathy.  Normal urinary bladder.  Dystrophic uterine calcification. No adnexal mass or significant free fluid.  Right gluteal lipoma. No acute osseous abnormality.   Hepatomegaly, 22.3 cm cranial caudal on coronal reformatted imaging.  IMPRESSION:  1.  Moderate to marked cirrhosis with hepatomegaly and portal venous hypertension. 2.  No other explanation for abdominal pain. 3.  Question distal esophagitis. 4.  Upper abdominal adenopathy, mild and favored to be related to cirrhosis.  Consider imaging follow-up with CT at 3 - 6 months.  Original Report Authenticated By: Consuello Bossier, M.D.     1. Cirrhosis   2. Abdominal pain       MDM  I spoke with GI who will closely follow up patient this week.  After treatment in the ED the patient feels back to baseline and wants to go home.       Nelia Shi, MD 07/07/11 1325

## 2011-07-06 NOTE — Discharge Instructions (Signed)
Cirrhosis Cirrhosis is a condition of scarring of the liver which is caused when the liver has tried repairing itself following damage. This damage may come from a previous infection such as one of the forms of hepatitis (usually hepatitis C), or the damage may come from being injured by toxins. The main toxin that causes this damage is alcohol. The scarring of the liver from use of alcohol is irreversible. That means the liver cannot return to normal even though alcohol is not used any more. The main danger of hepatitis C infection is that it may cause long-lasting (chronic) liver disease, and this also may lead to cirrhosis. This complication is progressive and irreversible. CAUSES   Prior to available blood tests, hepatitis C could be contracted by blood transfusions. Since testing of blood has improved, this is now unlikely. This infection can also be contracted through intravenous drug use and the sharing of needles. It can also be contracted through sexual relationships. The injury caused by alcohol comes from too much use. It is not a few drinks that poison the liver, but years of misuse. Usually there will be some signs and symptoms early with scarring of the liver that suggest the development of better habits. Alcohol should never be used while using acetaminophen. A small dose of both taken together may cause irreversible damage to the liver. HOME CARE INSTRUCTIONS   There is no specific treatment for cirrhosis. However, there are things you can do to avoid making the condition worse.  Rest as needed.   Eat a well-balanced diet. Your caregiver can help you with suggestions.   Vitamin supplements including vitamins A, K, D, and thiamine can help.   A low-salt diet, water restriction, or diuretic medicine may be needed to reduce fluid retention.   Avoid alcohol. This can be extremely toxic if combined with acetaminophen.   Avoid drugs which are toxic to the liver. Some of these include  isoniazid, methyldopa, acetaminophen, anabolic steroids (muscle-building drugs), erythromycin, and oral contraceptives (birth control pills). Check with your caregiver to make sure medicines you are presently taking will not be harmful.   Periodic blood tests may be required. Follow your caregiver's advice regarding the timing of these.   Milk thistle is an herbal remedy which does protect the liver against toxins. However, it will not help once the liver has been scarred.  SEEK MEDICAL CARE IF:  You have increasing fatigue or weakness.   You develop swelling of the hands, feet, legs, or face.   You vomit bright red blood, or a coffee ground appearing material.   You have blood in your stools, or the stools turn black and tarry.   You have a fever.   You develop loss of appetite, or have nausea and vomiting.   You develop jaundice.   You develop easy bruising or bleeding.   You have worsening of any of the problems you are concerned about.  Document Released: 03/03/2005 Document Revised: 02/20/2011 Document Reviewed: 10/20/2007 ExitCare Patient Information 2012 [employer]. 

## 2011-07-06 NOTE — ED Notes (Signed)
Pt reminded to give urine specimen.

## 2011-07-16 ENCOUNTER — Encounter (HOSPITAL_COMMUNITY): Payer: Self-pay | Admitting: Anesthesiology

## 2011-07-16 ENCOUNTER — Ambulatory Visit (HOSPITAL_COMMUNITY): Payer: Managed Care, Other (non HMO) | Admitting: Anesthesiology

## 2011-07-16 ENCOUNTER — Encounter (HOSPITAL_COMMUNITY)
Admission: RE | Disposition: A | Discharge status: Home / Self Care | Payer: Self-pay | Source: Ambulatory Visit | Attending: Gastroenterology

## 2011-07-16 ENCOUNTER — Ambulatory Visit (HOSPITAL_COMMUNITY)
Admission: RE | Admit: 2011-07-16 | Discharge: 2011-07-16 | Disposition: A | Discharge status: Home / Self Care | Payer: Managed Care, Other (non HMO) | Source: Ambulatory Visit | Attending: Gastroenterology | Admitting: Gastroenterology

## 2011-07-16 ENCOUNTER — Encounter (HOSPITAL_COMMUNITY): Payer: Self-pay | Admitting: *Deleted

## 2011-07-16 DIAGNOSIS — I8510 Secondary esophageal varices without bleeding: Secondary | ICD-10-CM | POA: Insufficient documentation

## 2011-07-16 DIAGNOSIS — R109 Unspecified abdominal pain: Secondary | ICD-10-CM | POA: Insufficient documentation

## 2011-07-16 DIAGNOSIS — K766 Portal hypertension: Secondary | ICD-10-CM | POA: Insufficient documentation

## 2011-07-16 DIAGNOSIS — K7460 Unspecified cirrhosis of liver: Secondary | ICD-10-CM | POA: Insufficient documentation

## 2011-07-16 DIAGNOSIS — K921 Melena: Secondary | ICD-10-CM | POA: Insufficient documentation

## 2011-07-16 DIAGNOSIS — K319 Disease of stomach and duodenum, unspecified: Secondary | ICD-10-CM | POA: Insufficient documentation

## 2011-07-16 HISTORY — DX: Chronic obstructive pulmonary disease, unspecified: J44.9

## 2011-07-16 HISTORY — PX: ESOPHAGOGASTRODUODENOSCOPY: SHX5428

## 2011-07-16 HISTORY — DX: Major depressive disorder, single episode, unspecified: F32.9

## 2011-07-16 HISTORY — DX: Gastro-esophageal reflux disease without esophagitis: K21.9

## 2011-07-16 HISTORY — DX: Anxiety disorder, unspecified: F41.9

## 2011-07-16 HISTORY — DX: Anemia, unspecified: D64.9

## 2011-07-16 HISTORY — DX: Depression, unspecified: F32.A

## 2011-07-16 SURGERY — EGD (ESOPHAGOGASTRODUODENOSCOPY)
Anesthesia: Monitor Anesthesia Care

## 2011-07-16 MED ORDER — PROMETHAZINE HCL 25 MG/ML IJ SOLN: 6.2500 mg | INTRAMUSCULAR | Status: DC | PRN

## 2011-07-16 MED ORDER — KETAMINE HCL 10 MG/ML IJ SOLN
INTRAMUSCULAR | Status: DC | PRN
  Administered 2011-07-16: 10 mg via INTRAVENOUS

## 2011-07-16 MED ORDER — FENTANYL CITRATE 0.05 MG/ML IJ SOLN: 25.0000 ug | INTRAMUSCULAR | Status: DC | PRN

## 2011-07-16 MED ORDER — BUTAMBEN-TETRACAINE-BENZOCAINE 2-2-14 % EX AERO
INHALATION_SPRAY | CUTANEOUS | Status: DC | PRN
  Administered 2011-07-16: 2 via TOPICAL

## 2011-07-16 MED ORDER — LACTATED RINGERS IV SOLN: INTRAVENOUS | Status: DC

## 2011-07-16 MED ORDER — LACTATED RINGERS IV SOLN
INTRAVENOUS | Status: DC
  Administered 2011-07-16: 08:00:00 via INTRAVENOUS

## 2011-07-16 MED ORDER — FENTANYL CITRATE 0.05 MG/ML IJ SOLN
INTRAMUSCULAR | Status: DC | PRN
  Administered 2011-07-16: 50 ug via INTRAVENOUS

## 2011-07-16 MED ORDER — LACTATED RINGERS IV SOLN
INTRAVENOUS | Status: DC | PRN
  Administered 2011-07-16: 08:00:00 via INTRAVENOUS

## 2011-07-16 MED ORDER — PROPOFOL 10 MG/ML IV EMUL
INTRAVENOUS | Status: DC | PRN
  Administered 2011-07-16: 100 ug/kg/min via INTRAVENOUS

## 2011-07-16 MED ORDER — MIDAZOLAM HCL 5 MG/5ML IJ SOLN
INTRAMUSCULAR | Status: DC | PRN
  Administered 2011-07-16: 2 mg via INTRAVENOUS

## 2011-07-16 NOTE — Op Note (Signed)
[facility] [address] [place], [place]  [address]  ENDOSCOPY PROCEDURE REPORT  PATIENT:  Michelle, Galvan  MR#:  [id number] BIRTHDATE:  Jun 19, 1955, 55 yrs. old  GENDER:  female ENDOSCOPIST:  Willis Modena, MD Referred by:  Catha Gosselin, M.D. PROCEDURE DATE:  07/16/2011 PROCEDURE:  EGD, diagnostic 43235 ASA CLASS:  Class III INDICATIONS:  abdominal pain, cirrhosis (variceal screening), blood in stool MEDICATIONS:   Cetacaine spray x 2, MAC sedation, administered by CRNA DESCRIPTION OF PROCEDURE:   After the risks benefits and alternatives of the procedure were thoroughly explained, informed consent was obtained.  The Pentax Gastroscope [id number] endoscope was introduced through the mouth and advanced to the second portion of the duodenum, without limitations.  The instrument was slowly withdrawn as the mucosa was fully examined. <<PROCEDUREIMAGES>> FINDINGS:  Two columns of small (Grade II) distal esophageal varices without red wale signs.  Mild proximal gastric portal gastropathy, otherwise normal stomach; no evidence of gastric varices upon retroflexion into cardia.  Normal duodenum to the second portion.  No blood seen to the extent of our examination.  ENDOSCOPIC IMPRESSION:    1.  Small esophageal varices without red wale signs. 2.  Mild proximal portal hypertensive gastropathy. 3.  Otherwise normal duodenum. Suspect bleeding is hemorrhoidal in nature (colonoscopy June             2011 showed external hemorrhoids).  RECOMMENDATIONS:      1.  Watch for potential complications of procedure. 2.  Start propranolol 10 mg bid,as HR/BP tolerates. 3.  Follow-up in office in 4-6 weeks.  _____________________________ Willis Modena  CC:  n. eSIGNEDWillis Modena at 07/16/2011 09:01 AM  Barnie Mort, [id number]

## 2011-07-16 NOTE — Transfer of Care (Signed)
Immediate Anesthesia Transfer of Care Note  Patient: [name]  Procedure(s) Performed: Procedure(s) (LRB): ESOPHAGOGASTRODUODENOSCOPY (EGD) (N/A)  Patient Location: PACU  Anesthesia Type: MAC  Level of Consciousness: oriented, sedated, patient cooperative and responds to stimulation  Airway & Oxygen Therapy: Patient Spontanous Breathing and Patient connected to nasal cannula oxygen  Post-op Assessment: Report given to PACU RN, Post -op Vital signs reviewed and stable and Patient moving all extremities X 4  Post vital signs: Reviewed and stable  Complications: No apparent anesthesia complications

## 2011-07-16 NOTE — Anesthesia Postprocedure Evaluation (Signed)
Anesthesia Post Note  Patient: [name]  Procedure(s) Performed: Procedure(s) (LRB): ESOPHAGOGASTRODUODENOSCOPY (EGD) (N/A)  Anesthesia type: MAC  Patient location: PACU  Post pain: Pain level controlled  Post assessment: Post-op Vital signs reviewed  Last Vitals:  Filed Vitals:   07/16/11 0855  BP: 125/62  Temp:   Resp: 15    Post vital signs: Reviewed  Level of consciousness: sedated  Complications: No apparent anesthesia complications

## 2011-07-16 NOTE — Preoperative (Signed)
Beta Blockers   Reason not to administer Beta Blockers:Not Applicable 

## 2011-07-16 NOTE — H&P (Signed)
Patient interval history reviewed.  Patient examined again.  There has been no change from documented H/P dated 07/08/11 (scanned into chart from our office) except as documented above.  Assessment:  1.  RUQ pain.  Likely hepatomegaly-related.  Chronic. 2.  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 3.  Blood in stool.  Resolving.  Colonoscopy June 2011 showed external hemorrhoids.  Plan: 1.  Endoscopy for variceal screening and to evaluate other causes of abdominal pain and blood in stool. 2.  Risks (bleeding, infection, bowel perforation that could require surgery, sedation-related changes in cardiopulmonary systems), benefits (identification and possible treatment of source of symptoms, exclusion of certain causes of symptoms), and alternatives (watchful waiting, radiographic imaging studies, empiric medical treatment) of upper endoscopy (EGD) were explained to patient & husband in detail and she wishes to proceed.

## 2011-07-16 NOTE — Anesthesia Preprocedure Evaluation (Addendum)
Anesthesia Evaluation  Patient identified by MRN, date of birth, ID band Patient awake    Reviewed: Allergy & Precautions, H&P , NPO status , Patient's Chart, lab work & pertinent test results  History of Anesthesia Complications Negative for: history of anesthetic complications  Airway Mallampati: II TM Distance: >3 FB Neck ROM: Full    Dental  (+) Edentulous Upper, Partial Lower and Dental Advisory Given   Pulmonary shortness of breath, COPD breath sounds clear to auscultation  Pulmonary exam normal       Cardiovascular hypertension, Rhythm:Regular Rate:Normal     Neuro/Psych Anxiety Depression  Neuromuscular disease    GI/Hepatic GERD-  Medicated,(+) Cirrhosis -       , Nonalcoholic steatotosis   Endo/Other  Diabetes mellitus-, Type 2, Insulin Dependent and Oral Hypoglycemic Agents  Renal/GU negative Renal ROS  negative genitourinary   Musculoskeletal negative musculoskeletal ROS (+)   Abdominal   Peds  Hematology negative hematology ROS (+)   Anesthesia Other Findings   Reproductive/Obstetrics negative OB ROS                          Anesthesia Physical Anesthesia Plan  ASA: III  Anesthesia Plan: MAC   Post-op Pain Management:    Induction: Intravenous  Airway Management Planned: Nasal Cannula  Additional Equipment:   Intra-op Plan:   Post-operative Plan:   Informed Consent: I have reviewed the patients History and Physical, chart, labs and discussed the procedure including the risks, benefits and alternatives for the proposed anesthesia with the patient or authorized representative who has indicated his/her understanding and acceptance.   Dental advisory given  Plan Discussed with: CRNA  Anesthesia Plan Comments:         Anesthesia Quick Evaluation

## 2011-07-16 NOTE — Discharge Instructions (Signed)
Endoscopy °Care After °Please read the instructions outlined below and refer to this sheet in the next few weeks. These discharge instructions provide you with general information on caring for yourself after you leave the hospital. Your doctor may also give you specific instructions. While your treatment has been planned according to the most current medical practices available, unavoidable complications occasionally occur. If you have any problems or questions after discharge, please call Dr. Fable Huisman ([facility]) at [phone]. ° °HOME CARE INSTRUCTIONS °Activity °· You may resume your regular activity but move at a slower pace for the next 24 hours.  °· Take frequent rest periods for the next 24 hours.  °· Walking will help expel (get rid of) the air and reduce the bloated feeling in your abdomen.  °· No driving for 24 hours (because of the anesthesia (medicine) used during the test).  °· You may shower.  °· Do not sign any important legal documents or operate any machinery for 24 hours (because of the anesthesia used during the test).  °Nutrition °· Drink plenty of fluids.  °· You may resume your normal diet.  °· Begin with a light meal and progress to your normal diet.  °· Avoid alcoholic beverages for 24 hours or as instructed by your caregiver.  °Medications °You may resume your normal medications unless your caregiver tells you otherwise. °What you can expect today °· You may experience abdominal discomfort such as a feeling of fullness or "gas" pains.  °· You may experience a sore throat for 2 to 3 days. This is normal. Gargling with salt water may help this.  °·  °SEEK IMMEDIATE MEDICAL CARE IF: °· You have excessive nausea (feeling sick to your stomach) and/or vomiting.  °· You have severe abdominal pain and distention (swelling).  °· You have trouble swallowing.  °· You have a temperature over 100° F (37.8° C).  °· You have rectal bleeding or vomiting of blood.  °Document Released:  10/16/2003 Document Revised: 11/13/2010 Document Reviewed: 04/28/2007 °ExitCare® Patient Information ©2012 [employer]. °

## 2011-07-17 ENCOUNTER — Encounter (HOSPITAL_COMMUNITY): Payer: Self-pay | Admitting: Gastroenterology

## 2012-04-27 DIAGNOSIS — F079 Unspecified personality and behavioral disorder due to known physiological condition: Secondary | ICD-10-CM | POA: Insufficient documentation

## 2012-04-27 DIAGNOSIS — G2581 Restless legs syndrome: Secondary | ICD-10-CM | POA: Insufficient documentation

## 2012-07-15 ENCOUNTER — Other Ambulatory Visit: Payer: Self-pay | Admitting: Gastroenterology

## 2012-07-15 DIAGNOSIS — K7460 Unspecified cirrhosis of liver: Secondary | ICD-10-CM

## 2012-07-15 DIAGNOSIS — R1010 Upper abdominal pain, unspecified: Secondary | ICD-10-CM

## 2012-07-16 ENCOUNTER — Other Ambulatory Visit: Payer: Self-pay | Admitting: Gastroenterology

## 2012-07-16 ENCOUNTER — Inpatient Hospital Stay: Admission: RE | Admit: 2012-07-16 | Payer: Managed Care, Other (non HMO) | Source: Ambulatory Visit

## 2012-07-16 DIAGNOSIS — R141 Gas pain: Secondary | ICD-10-CM

## 2012-07-16 DIAGNOSIS — K7460 Unspecified cirrhosis of liver: Secondary | ICD-10-CM

## 2012-07-16 DIAGNOSIS — K921 Melena: Secondary | ICD-10-CM

## 2012-07-16 DIAGNOSIS — R1011 Right upper quadrant pain: Secondary | ICD-10-CM

## 2012-07-19 ENCOUNTER — Ambulatory Visit
Admission: RE | Admit: 2012-07-19 | Discharge: 2012-07-19 | Disposition: A | Discharge status: Home / Self Care | Payer: Managed Care, Other (non HMO) | Source: Ambulatory Visit | Attending: Gastroenterology | Admitting: Gastroenterology

## 2012-07-19 DIAGNOSIS — R141 Gas pain: Secondary | ICD-10-CM

## 2012-07-19 DIAGNOSIS — R1011 Right upper quadrant pain: Secondary | ICD-10-CM

## 2012-07-19 DIAGNOSIS — K921 Melena: Secondary | ICD-10-CM

## 2012-07-19 DIAGNOSIS — K7460 Unspecified cirrhosis of liver: Secondary | ICD-10-CM

## 2012-07-22 ENCOUNTER — Ambulatory Visit
Admission: RE | Admit: 2012-07-22 | Discharge: 2012-07-22 | Disposition: A | Discharge status: Home / Self Care | Payer: Managed Care, Other (non HMO) | Source: Ambulatory Visit | Attending: Gastroenterology | Admitting: Gastroenterology

## 2012-07-22 DIAGNOSIS — K7460 Unspecified cirrhosis of liver: Secondary | ICD-10-CM

## 2012-07-22 DIAGNOSIS — R1010 Upper abdominal pain, unspecified: Secondary | ICD-10-CM

## 2012-07-22 MED ORDER — IOHEXOL 300 MG/ML  SOLN
125.0000 mL | Freq: Once | INTRAMUSCULAR | Status: AC | PRN
  Administered 2012-07-22: 125 mL via INTRAVENOUS

## 2012-09-14 ENCOUNTER — Encounter: Payer: Self-pay | Admitting: Neurology

## 2012-09-14 ENCOUNTER — Ambulatory Visit (INDEPENDENT_AMBULATORY_CARE_PROVIDER_SITE_OTHER): Payer: 59 | Admitting: Neurology

## 2012-09-14 VITALS — BP 112/64 | HR 82 | Ht 64.5 in | Wt 198.0 lb

## 2012-09-14 DIAGNOSIS — F079 Unspecified personality and behavioral disorder due to known physiological condition: Secondary | ICD-10-CM

## 2012-09-14 DIAGNOSIS — G2581 Restless legs syndrome: Secondary | ICD-10-CM

## 2012-09-14 DIAGNOSIS — K7460 Unspecified cirrhosis of liver: Secondary | ICD-10-CM

## 2012-09-14 MED ORDER — PREDNISONE 5 MG PO TABS: ORAL_TABLET | ORAL | Status: DC

## 2012-09-14 NOTE — Progress Notes (Deleted)
i

## 2012-09-14 NOTE — Progress Notes (Signed)
Reason for visit: Memory disturbance  [name] is an 57 y.o. female  History of present illness:  [name] is a 57 year old right-handed white female with a history of nonalcoholic cirrhosis of the liver. The patient is felt to have a mild hepatic encephalopathy. The patient indicates that she is continuing to gradually get worse with her memory. The patient is not clear that she has had a recent ammonia level checked. Patient indicates that she is having episodes where she will "drop things" suddenly from her hand. The patient has gait instability, but she will not fall. The patient is sleeping well at night. The patient believes that her memory has changed since her last visit here. The patient indicates that her varicose veins in the legs are worsening, and she does also have hemorrhoids. The patient returns for an evaluation. MRI the brain was relatively unremarkable with the exception of mild small vessel disease. Blood work to include a copper level and ceruloplasmin level was unremarkable.  Past Medical History  Diagnosis Date  . Hypertension   . Diabetes mellitus   . Hyperlipidemia   . Fatigue   . Night sweats   . Wears dentures   . Poor appetite   . Retinopathy   . Arthritis   . Rash   . Bruises easily   . Colon polyp   . Hernia   . Rectal bleeding   . Hemorrhoid   . Family history of breast cancer   . Abdominal pain     spasms radiating around to the right side of abdomen   . Shortness of breath   . Night sweats   . Hepatic encephalopathy   . Non-alcoholic fatty liver disease   . Liver cirrhosis     nonalcoholic  . GERD (gastroesophageal reflux disease)   . Depression   . Anxiety   . COPD (chronic obstructive pulmonary disease)   . Anemia   . IBS (irritable bowel syndrome)   . RLS (restless legs syndrome)   . Macular degeneration   . Memory loss   . Degenerative arthritis     Past Surgical History  Procedure Laterality Date  . Cholecystectomy   11/28/10  . Liver biopsy  Sep 2012  . Hernia repair      belly button  . Esophagogastroduodenoscopy  07/16/2011    Procedure: ESOPHAGOGASTRODUODENOSCOPY (EGD);  Surgeon:  Modena, MD;  Location: [facility];  Service: Endoscopy;  Laterality: N/A;    Family History  Problem Relation Age of Onset  . Cancer Mother   . Hypertension Mother   . Hyperlipidemia Father   . Iron deficiency Sister   . Heart disease Brother   . Hyperlipidemia Brother     Social history:  reports that she quit smoking about 7 years ago. She does not have any smokeless tobacco history on file. She reports that she does not drink alcohol or use illicit drugs.  Allergies:  Allergies  Allergen Reactions  . Latex Rash  . Actos (Pioglitazone)     Edema   . Byetta 10 Mcg Pen (Exenatide)     Stomach upset  . Crestor (Rosuvastatin)     Increases LFT  . Lipitor (Atorvastatin)     Muscle aches  . Penicillins Hives and Itching    All over the body.    Medications:  Current Outpatient Prescriptions on File Prior to Visit  Medication Sig Dispense Refill  . ALPRAZolam (XANAX) 0.25 MG tablet Take 0.25 mg by mouth 3 (three) times  daily as needed.      . cholecalciferol (VITAMIN D) 1000 UNITS tablet Take 1,000 Units by mouth daily.      . colesevelam (WELCHOL) 625 MG tablet Take 1,875 mg by mouth 2 (two) times daily. Three pills taken per dosage. Total of six per day.       . DULoxetine (CYMBALTA) 60 MG capsule Take 60 mg by mouth daily.        [name] Kitchen esomeprazole (NEXIUM) 40 MG capsule Take 40 mg by mouth daily before breakfast.       . ezetimibe (ZETIA) 10 MG tablet Take 10 mg by mouth daily.        . fluticasone (VERAMYST) 27.5 MCG/SPRAY nasal spray Place 2 sprays into the nose daily.      [name] Kitchen gabapentin (NEURONTIN) 300 MG capsule Take 300 mg by mouth 1 day or 1 dose.      . insulin detemir (LEVEMIR) 100 UNIT/ML injection Inject 140 Units into the skin daily.      . metFORMIN (GLUMETZA) 1000 MG (MOD) 24 hr tablet  Take 1,000 mg by mouth 2 (two) times daily with a meal.        . polyethylene glycol (MIRALAX / GLYCOLAX) packet Take 17 g by mouth as needed. Constipation      . pravastatin (PRAVACHOL) 20 MG tablet Take 20 mg by mouth daily.      . rifaximin (XIFAXAN) 550 MG TABS Take 550 mg by mouth 2 (two) times daily.       No current facility-administered medications on file prior to visit.    ROS:  Out of a complete 14 system review of symptoms, the patient complains only of the following symptoms, and all other reviewed systems are negative.  Fatigue Chest pain, palpitations, swelling in the legs Hearing loss Skin rash, itching Blurred vision, eye pain Shortness of breath, cough Blood in the stool, diarrhea, constipation Urination problems Anemia, easy bruising, easy bleeding Feeling cold, increased thirst Joint pain, joint swelling, achy muscles Runny nose, skin sensitivity Memory loss, confusion, weakness, difficulty swallowing, dizziness Depression, anxiety, decreased energy Restless legs  Blood pressure 112/64, pulse 82, height 5' 4.5" (1.638 m), weight 198 lb (89.812 kg).  Physical Exam  General: The patient is alert and cooperative at the time of the examination. The patient is moderately obese.  Skin: No significant peripheral edema is noted.   Neurologic Exam  Mental status: Mini-Mental status examination done today shows a total score 28/30. The patient is able to name 10 animals in 60 seconds.  Cranial nerves: Facial symmetry is present. Speech is normal, no aphasia or dysarthria is noted. Extraocular movements are full. Visual fields are full.  Motor: The patient has good strength in all 4 extremities.  Coordination: The patient has good finger-nose-finger and heel-to-shin bilaterally. No asterixis was noted in the arms.  Gait and station: The patient has a normal gait. Tandem gait is slightly unsteady. Romberg is negative. No drift is seen.  Reflexes: Deep tendon  reflexes are symmetric. The ankle jerk reflexes are well-maintained.   Assessment/Plan:  One. Progressive memory disturbance  2. Nonalcoholic cirrhosis of the liver  3. Gait disorder  The patient will be sent for some further blood work. The patient may have a low-grade hepatic encephalopathy as an etiology for her cognitive issues. If the blood work and ammonia levels are completely normal, low dose Aricept may still be considered. The memory issues will be followed over time. The patient will followup in 6 months.  Marlan Palau MD 09/14/2012 8:56 PM  [facility] [address] [place], [place] [address]  Phone [phone] Fax [phone]

## 2012-09-15 ENCOUNTER — Telehealth: Payer: Self-pay | Admitting: Neurology

## 2012-09-15 LAB — AMMONIA: Ammonia: 104 ug/dL — ABNORMAL HIGH (ref 19–87)

## 2012-09-15 NOTE — Telephone Encounter (Signed)
I Called patient. The ammonia level is slightly elevated at 104. This is the likely cause of the asterixis and of some the cognitive issues that the patient is reporting. The patient may need some alteration to her therapy for her chronic liver failure.

## 2012-09-20 ENCOUNTER — Telehealth: Payer: Self-pay | Admitting: Neurology

## 2012-09-21 NOTE — Telephone Encounter (Signed)
I called patient. I had recommended going on Aricept if the blood work was unremarkable. However, the ammonia level was elevated. I would probably not start Aricept at this time, but rather more aggressively manage the ammonia levels. If the cognitive issues do not improve, Aricept can be used in the future. I discussed this with the patient.

## 2012-10-25 ENCOUNTER — Ambulatory Visit: Payer: Managed Care, Other (non HMO) | Admitting: *Deleted

## 2013-03-22 ENCOUNTER — Encounter: Payer: Self-pay | Admitting: Nurse Practitioner

## 2013-03-22 ENCOUNTER — Encounter (INDEPENDENT_AMBULATORY_CARE_PROVIDER_SITE_OTHER): Payer: Self-pay

## 2013-03-22 ENCOUNTER — Ambulatory Visit (INDEPENDENT_AMBULATORY_CARE_PROVIDER_SITE_OTHER): Payer: Managed Care, Other (non HMO) | Admitting: Nurse Practitioner

## 2013-03-22 VITALS — BP 135/65 | HR 73 | Ht 65.0 in | Wt 212.0 lb

## 2013-03-22 DIAGNOSIS — K7460 Unspecified cirrhosis of liver: Secondary | ICD-10-CM

## 2013-03-22 DIAGNOSIS — F079 Unspecified personality and behavioral disorder due to known physiological condition: Secondary | ICD-10-CM

## 2013-03-22 NOTE — Progress Notes (Signed)
I have read the note, and I agree with the clinical assessment and plan.  Kamiah Fite KEITH   

## 2013-03-22 NOTE — Progress Notes (Signed)
[facility]  PATIENT: [name] DOB: Aug 06, 1955   REASON FOR VISIT: Followup for mild hepatic encephalopathy   HISTORY OF PRESENT ILLNESS: [name], 58 year old female returns for followup. She was last seen by Dr. Anne Hahn 09/14/2012. At that time she was complaining with some mild memory problems and she was also dropping things. She had some mild gait instability but no falls. Ammonia level was obtained and was 104. Dr. Dulce Sellar who treats her cirrhosis changed a  Medication per patient.  On today's visit she reports her memory is better. She has not had a repeat ammonia level.   HISTORY:  of nonalcoholic cirrhosis of the liver. The patient is felt to have a mild hepatic encephalopathy. The patient indicates that she is continuing to gradually get worse with her memory. The patient is not clear that she has had a recent ammonia level checked. Patient indicates that she is having episodes where she will "drop things" suddenly from her hand. The patient has gait instability, but she will not fall. The patient is sleeping well at night. The patient believes that her memory has changed since her last visit here. The patient indicates that her varicose veins in the legs are worsening, and she does also have hemorrhoids. The patient returns for an evaluation. MRI the brain was relatively unremarkable with the exception of mild small vessel disease. Blood work to include a copper level and ceruloplasmin level was unremarkable.   REVIEW OF SYSTEMS: Full 14 system review of systems performed and notable only for those listed, all others are neg:  Constitutional: Fatigue Cardiovascular: Swelling of the legs Ear/Nose/Throat: N/A  Skin: N/A  Eyes: N/A  Respiratory: N/A  Gastroitestinal: Swollen abdomen, abdominal pain, rectal bleeding, constipation, diarrhea Hematology/Lymphatic: N/A  Endocrine: N/A Musculoskeletal: Joint pain, joint swelling, muscle cramps, difficulty  walking  Allergy/Immunology: N/A  Neurological: Memory loss Psychiatric: Confusion, anxiety  ALLERGIES: Allergies  Allergen Reactions  . Latex Rash  . Actos [Pioglitazone]     Edema   . Byetta 10 Mcg Pen [Exenatide]     Stomach upset  . Crestor [Rosuvastatin]     Increases LFT  . Lipitor [Atorvastatin]     Muscle aches  . Penicillins Hives and Itching    All over the body.    HOME MEDICATIONS: Outpatient Prescriptions Prior to Visit  Medication Sig Dispense Refill  . ALPRAZolam (XANAX) 0.25 MG tablet Take 0.25 mg by mouth 3 (three) times daily as needed.      . cholecalciferol (VITAMIN D) 1000 UNITS tablet Take 1,000 Units by mouth daily.      . colesevelam (WELCHOL) 625 MG tablet Take 1,875 mg by mouth 2 (two) times daily. Three pills taken per dosage. Total of six per day.       . dicyclomine (BENTYL) 10 MG capsule Take 10 mg by mouth 4 (four) times daily as needed.       . DULoxetine (CYMBALTA) 60 MG capsule Take 60 mg by mouth daily.        [name] Kitchen esomeprazole (NEXIUM) 40 MG capsule Take 40 mg by mouth daily before breakfast.       . ezetimibe (ZETIA) 10 MG tablet Take 10 mg by mouth daily.        . fluticasone (VERAMYST) 27.5 MCG/SPRAY nasal spray Place 2 sprays into the nose daily.      [name] Kitchen gabapentin (NEURONTIN) 300 MG capsule Take 300 mg by mouth 1 day or 1 dose.      [name] Kitchen  insulin detemir (LEVEMIR) 100 UNIT/ML injection Inject 140 Units into the skin daily.      . polyethylene glycol (MIRALAX / GLYCOLAX) packet Take 17 g by mouth as needed. Constipation      . pravastatin (PRAVACHOL) 20 MG tablet Take 20 mg by mouth daily.      . rifaximin (XIFAXAN) 550 MG TABS Take 550 mg by mouth 2 (two) times daily.      . metFORMIN (GLUMETZA) 1000 MG (MOD) 24 hr tablet Take 1,000 mg by mouth 2 (two) times daily with a meal.         No facility-administered medications prior to visit.    PAST MEDICAL HISTORY: Past Medical History  Diagnosis Date  . Hypertension   . Diabetes mellitus     . Hyperlipidemia   . Fatigue   . Night sweats   . Wears dentures   . Poor appetite   . Retinopathy   . Arthritis   . Rash   . Bruises easily   . Colon polyp   . Hernia   . Rectal bleeding   . Hemorrhoid   . Family history of breast cancer   . Abdominal pain     spasms radiating around to the right side of abdomen   . Shortness of breath   . Night sweats   . Hepatic encephalopathy   . Non-alcoholic fatty liver disease   . Liver cirrhosis     nonalcoholic  . GERD (gastroesophageal reflux disease)   . Depression   . Anxiety   . COPD (chronic obstructive pulmonary disease)   . Anemia   . IBS (irritable bowel syndrome)   . RLS (restless legs syndrome)   . Macular degeneration   . Memory loss   . Degenerative arthritis     PAST SURGICAL HISTORY: Past Surgical History  Procedure Laterality Date  . Cholecystectomy  11/28/10  . Liver biopsy  Sep 2012  . Hernia repair      belly button  . Esophagogastroduodenoscopy  07/16/2011    Procedure: ESOPHAGOGASTRODUODENOSCOPY (EGD);  Surgeon: Willis ModenaWilliam Outlaw, MD;  Location: [facility];  Service: Endoscopy;  Laterality: N/A;    FAMILY HISTORY: Family History  Problem Relation Age of Onset  . Cancer Mother   . Hypertension Mother   . Hyperlipidemia Father   . Iron deficiency Sister   . Heart disease Brother   . Hyperlipidemia Brother     SOCIAL HISTORY: History   Social History  . Marital Status: Married    Spouse Name: N/A    Number of Children: 2  . Years of Education: 12   Occupational History  . Unemployed    Social History Main Topics  . Smoking status: Former Smoker    Quit date: 08/21/2005  . Smokeless tobacco: Never Used  . Alcohol Use: No  . Drug Use: No  . Sexual Activity: Yes    Birth Control/ Protection: None   Other Topics Concern  . Not on file   Social History Narrative   Patient is married to [place].    Patient lives at home.    Patient has 2 children.    Patient is currently not working.     Patient has a 12th grade education.      PHYSICAL EXAM  Filed Vitals:   03/22/13 1326  BP: 135/65  Pulse: 73  Height: 5\' 5"  (1.651 m)  Weight: 212 lb (96.163 kg)   Body mass index is 35.28 kg/(m^2).  Generalized: Well developed, moderately obese  female in no acute distress    Neurological examination   Mentation: Alert oriented to time, place, history taking. MMSE 30/30. AFT 15. Follows all commands speech and language fluent  Cranial nerve II-XII: Pupils were equal round reactive to light extraocular movements were full, visual field were full on confrontational test. Facial sensation and strength were normal. hearing was intact to finger rubbing bilaterally. Uvula tongue midline. head turning and shoulder shrug were normal and symmetric.Tongue protrusion into cheek strength was normal. Motor: normal bulk and tone, full strength in the BUE, BLE, fine finger movements normal, no pronator drift. No focal weakness Sensory: normal and symmetric to light touch, pinprick, and  vibration  Coordination: finger-nose-finger, heel-to-shin bilaterally, no dysmetria, no asterixis Reflexes: Brachioradialis 2/2, biceps 2/2, triceps 2/2, patellar 2/2, Achilles 2/2, plantar responses were flexor bilaterally. Gait and Station: Rising up from seated position without assistance, normal stance,  moderate stride, good arm swing, smooth turning, able to perform tiptoe, and heel walking without difficulty. Tandem gait is unsteady  DIAGNOSTIC DATA (LABS, IMAGING, TESTING) - None to review   ASSESSMENT AND PLAN  58 y.o. year old female  has a past medical history of mild memory disturbance, nonalcoholic cirrhosis of the liver and gait disorder  Memory testing is stable Will get ammonia level F/U 8 months Nilda Riggs, [facility], [facility], APRN  [facility] [address], [address] [place], [place] [address] [phone]

## 2013-03-22 NOTE — Patient Instructions (Signed)
Memory testing is stable Will get ammonia level F/U 8 months

## 2013-03-23 LAB — AMMONIA: Ammonia: 80 ug/dL (ref 19–87)

## 2013-04-15 ENCOUNTER — Other Ambulatory Visit: Payer: Self-pay | Admitting: Gastroenterology

## 2013-04-15 DIAGNOSIS — Z8719 Personal history of other diseases of the digestive system: Secondary | ICD-10-CM

## 2013-04-15 DIAGNOSIS — R140 Abdominal distension (gaseous): Secondary | ICD-10-CM

## 2013-04-19 ENCOUNTER — Ambulatory Visit
Admission: RE | Admit: 2013-04-19 | Discharge: 2013-04-19 | Disposition: A | Discharge status: Home / Self Care | Payer: Managed Care, Other (non HMO) | Source: Ambulatory Visit | Attending: Gastroenterology | Admitting: Gastroenterology

## 2013-04-19 DIAGNOSIS — Z8719 Personal history of other diseases of the digestive system: Secondary | ICD-10-CM

## 2013-04-19 DIAGNOSIS — R140 Abdominal distension (gaseous): Secondary | ICD-10-CM

## 2013-04-20 ENCOUNTER — Other Ambulatory Visit (HOSPITAL_COMMUNITY): Payer: Self-pay | Admitting: Gastroenterology

## 2013-04-20 DIAGNOSIS — R188 Other ascites: Secondary | ICD-10-CM

## 2013-04-20 DIAGNOSIS — R142 Eructation: Secondary | ICD-10-CM

## 2013-04-20 DIAGNOSIS — K7460 Unspecified cirrhosis of liver: Secondary | ICD-10-CM

## 2013-04-20 DIAGNOSIS — R141 Gas pain: Secondary | ICD-10-CM

## 2013-04-20 DIAGNOSIS — R143 Flatulence: Secondary | ICD-10-CM

## 2013-04-21 ENCOUNTER — Ambulatory Visit (HOSPITAL_COMMUNITY)
Admission: RE | Admit: 2013-04-21 | Discharge: 2013-04-21 | Disposition: A | Discharge status: Home / Self Care | Payer: Managed Care, Other (non HMO) | Source: Ambulatory Visit | Attending: Gastroenterology | Admitting: Gastroenterology

## 2013-04-21 ENCOUNTER — Encounter (HOSPITAL_COMMUNITY): Payer: Self-pay

## 2013-04-21 ENCOUNTER — Other Ambulatory Visit (HOSPITAL_COMMUNITY): Payer: Managed Care, Other (non HMO)

## 2013-04-21 ENCOUNTER — Ambulatory Visit (HOSPITAL_COMMUNITY): Payer: Managed Care, Other (non HMO)

## 2013-04-21 DIAGNOSIS — R142 Eructation: Secondary | ICD-10-CM

## 2013-04-21 DIAGNOSIS — R188 Other ascites: Secondary | ICD-10-CM

## 2013-04-21 DIAGNOSIS — K7460 Unspecified cirrhosis of liver: Secondary | ICD-10-CM | POA: Insufficient documentation

## 2013-04-21 DIAGNOSIS — R141 Gas pain: Secondary | ICD-10-CM

## 2013-04-21 DIAGNOSIS — R143 Flatulence: Secondary | ICD-10-CM

## 2013-04-21 HISTORY — DX: Other specified diabetes mellitus without complications: E13.9

## 2013-04-21 HISTORY — DX: Polyneuropathy, unspecified: G62.9

## 2013-04-21 LAB — BODY FLUID CELL COUNT WITH DIFFERENTIAL
EOS FL: 0 %
Lymphs, Fluid: 38 %
Monocyte-Macrophage-Serous Fluid: 62 % (ref 50–90)
NEUTROPHIL FLUID: 0 % (ref 0–25)
WBC FLUID: 585 uL (ref 0–1000)

## 2013-04-21 LAB — GLUCOSE, CAPILLARY: Glucose-Capillary: 46 mg/dL — ABNORMAL LOW (ref 70–99)

## 2013-04-21 MED ORDER — ALBUMIN HUMAN 25 % IV SOLN
50.0000 g | Freq: Once | INTRAVENOUS | Status: AC
  Administered 2013-04-21: 50 g via INTRAVENOUS
  Filled 2013-04-21: qty 200

## 2013-04-21 MED ORDER — SODIUM CHLORIDE 0.9 % IV SOLN
Freq: Once | INTRAVENOUS | Status: AC
  Administered 2013-04-21: 12:00:00 via INTRAVENOUS

## 2013-04-21 NOTE — Discharge Instructions (Signed)
Albumin injection What is this medicine? ALBUMIN (al BYOO min) is used to treat or prevent shock following serious injury, bleeding, surgery, or burns by increasing the volume of blood plasma. This medicine can also replace low blood protein. This medicine may be used for other purposes; ask your health care provider or pharmacist if you have questions. COMMON BRAND NAME(S): Albuked , Albumarc, Albuminar, Albutein, Buminate, Flexbumin, Kedbumin, Macrotec, Plasbumin What should I tell my health care provider before I take this medicine? They need to know if you have any of the following conditions: -anemia -heart disease -kidney disease -an unusual or allergic reaction to albumin, other medicines, foods, dyes, or preservatives -pregnant or trying to get pregnant -breast-feeding How should I use this medicine? This medicine is for infusion into a vein. It is given by a health-care professional in a hospital or clinic. Talk to your pediatrician regarding the use of this medicine in children. While this drug may be prescribed for selected conditions, precautions do apply. Overdosage: If you think you have taken too much of this medicine contact a poison control center or emergency room at once. NOTE: This medicine is only for you. Do not share this medicine with others. What if I miss a dose? This does not apply. What may interact with this medicine? Interactions are not expected. This list may not describe all possible interactions. Give your health care provider a list of all the medicines, herbs, non-prescription drugs, or dietary supplements you use. Also tell them if you smoke, drink alcohol, or use illegal drugs. Some items may interact with your medicine. What should I watch for while using this medicine? Your condition will be closely monitored while you receive this medicine. Some products are derived from human plasma, and there is a small risk that these products may contain certain  types of virus or bacteria. All products are processed to kill most viruses and bacteria. If you have questions concerning the risk of infections, discuss them with your doctor or health care professional. What side effects may I notice from receiving this medicine? Side effects that you should report to your doctor or health care professional as soon as possible: -allergic reactions like skin rash, itching or hives, swelling of the face, lips, or tongue -breathing problems -changes in heartbeat -fever, chills -pain, redness or swelling at the injection site -signs of viral infection including fever, drowsiness, chills, runny nose followed in about 2 weeks by a rash and joint pain -tightness in the chest Side effects that usually do not require medical attention (report to your doctor or health care professional if they continue or are bothersome): -increased salivation -nausea, vomiting This list may not describe all possible side effects. Call your doctor for medical advice about side effects. You may report side effects to FDA at 1-800-FDA-1088. Where should I keep my medicine? This does not apply. You will not be given this medicine to store at home. NOTE: This sheet is a summary. It may not cover all possible information. If you have questions about this medicine, talk to your doctor, pharmacist, or health care provider.  2014, Elsevier/Gold Standard. (2007-05-27 10:18:55) Ascites Ascites is a gathering of fluid in the belly (abdomen). This is most often caused by liver disease. It may also be caused by a number of other less common problems. It causes a ballooning out (distension) of the abdomen. CAUSES  Scarring of the liver (cirrhosis) is the most common cause of ascites. Other causes include:  Infection or inflammation  in the abdomen.  Cancer in the abdomen.  Heart failure.  Certain forms of kidney failure (nephritic syndrome).  Inflammation of the pancreas.  Clots in the  veins of the liver. SYMPTOMS  In the early stages of ascites, you may not have any symptoms. The main symptom of ascites is a sense of abdominal bloating. This is due to the presence of fluid. This may also cause an increase in abdominal or waist size. People with this condition can develop swelling in the legs, and men can develop a swollen scrotum. When there is a lot of fluid, it may be hard to breath. Stretching of the abdomen by fluid can be painful. DIAGNOSIS  Certain features of your medical history, such as a history of liver disease and of an enlarging abdomen, can suggest the presence of ascites. The diagnosis of ascites can be made on physical exam by your caregiver. An abdominal ultrasound examination can confirm that ascites is present, and estimate the amount of fluid. Once ascites is confirmed, it is important to determine its cause. Again, a history of one of the conditions listed in "CAUSES" provides a strong clue. A physical exam is important, and blood and X-ray tests may be needed. During a procedure called paracentesis, a sample of fluid is removed from the abdomen. This can determine certain key features about the fluid, such as whether or not infection or cancer is present. Your caregiver will determine if a paracentesis is necessary. They will describe the procedure to you. PREVENTION  Ascites is a complication of other conditions. Therefore to prevent ascites, you must seek treatment for any significant health conditions you have. Once ascites is present, careful attention to fluid and salt intake may help prevent it from getting worse. If you have ascites, you should not drink alcohol. PROGNOSIS  The prognosis of ascites depends on the underlying disease. If the disease is reversible, such as with certain infections or with heart failure, then ascites may improve or disappear. When ascites is caused by cirrhosis, then it indicates that the liver disease has worsened, and further  evaluation and treatment of the liver disease is needed. If your ascites is caused by cancer, then the success or failure of the cancer treatment will determine whether your ascites will improve or worsen. RISKS AND COMPLICATIONS  Ascites is likely to worsen if it is not properly diagnosed and treated. A large amount of ascites can cause pain and difficulty breathing. The main complication, besides worsening, is infection (called spontaneous bacterial peritonitis). This requires prompt treatment. TREATMENT  The treatment of ascites depends on its cause. When liver disease is your cause, medical management using water pills (diuretics) and decreasing salt intake is often effective. Ascites due to peritoneal inflammation or malignancy (cancer) alone does not respond to salt restriction and diuretics. Hospitalization is sometimes required. If the treatment of ascites cannot be managed with medications, a number of other treatments are available. Your caregivers will help you decide which will work best for you. Some of these are:  Removal of fluid from the abdomen (paracentesis).  Fluid from the abdomen is passed into a vein (peritoneovenous shunting).  Liver transplantation.  Transjugular intrahepatic portosystemic stent shunt. HOME CARE INSTRUCTIONS  It is important to monitor body weight and the intake and output of fluids. Weigh yourself at the same time every day. Record your weights. Fluid restriction may be necessary. It is also important to know your salt intake. The more salt you take in, the more fluid  you will retain. Ninety percent of people with ascites respond to this approach.  Follow any directions for medicines carefully.  Follow up with your caregiver, as directed.  Report any changes in your health, especially any new or worsening symptoms.  If your ascites is from liver disease, avoid alcohol and other substances toxic to the liver. SEEK MEDICAL CARE IF:   Your weight  increases more than a few pounds in a few days.  Your abdominal or waist size increases.  You develop swelling in your legs.  You had swelling and it worsens. SEEK IMMEDIATE MEDICAL CARE IF:   You develop a fever.  You develop new abdominal pain.  You develop difficulty breathing.  You develop confusion.  You have bleeding from the mouth, stomach, or rectum. MAKE SURE YOU:   Understand these instructions.  Will watch your condition.  Will get help right away if you are not doing well or get worse. Document Released: 03/03/2005 Document Revised: 05/26/2011 Document Reviewed: 10/02/2006 [facility] Patient Information 2014 [employer] [place]. Paracentesis Paracentesis is a procedure used to remove excess fluid from the belly (abdomen). Excess fluid in the belly is called ascites. Excess fluid can be the result of certain conditions, such as infection, inflammation, abdominal injury, heart failure, chronic scarring of the liver (cirrhosis), or cancer. The excess fluid is removed using a needle inserted through the skin and tissue into the abdomen.  A paracentesis may be done to:  Determine the cause of the excess fluid through examination of the fluid.  Relieve symptoms of shortness of breath or pain caused by the excess fluid.  Determine presence of bleeding after an abdominal injury. LET YOUR CAREGIVERS KNOW ABOUT:  Allergies.  Medications taken including herbs, eye drops, over-the-counter medications, and creams.  Use of steroids (by mouth or creams).  Previous problems with anesthetics or numbing medicine.  Possibility of pregnancy, if this applies.  History of blood clots (thrombophlebitis).  History of bleeding or blood problems.  Previous surgery.  Other health problems. RISKS AND COMPLICATIONS  Injury to an abdominal organ, such as the bowel (large intestine), liver, spleen, or bladder.  Possible infection.  Bleeding.  Low blood pressure  (hypotension). BEFORE THE PROCEDURE This is a procedure that can be done as an outpatient. Confirm the time that you need to arrive for your procedure. A blood sample may be done to determine your blood clotting time. The presence of a severe bleeding disorder (coagulopathy) which cannot be promptly corrected may make this procedure inadvisable. You may be asked to urinate. PROCEDURE The procedure will take about 30 minutes. This time will vary depending on the amount of fluid that is removed. You may be asked to lie on your back with your head elevated. An area on your abdomen will be cleansed. A numbing medicine may then be injected (local anesthesia) into the skin and tissue. A needle is inserted through your abdominal skin and tissues until it is positioned in your abdomen. You may feel pressure or slight pain as the needle is positioned into the abdomen. Fluid is removed from the abdomen through the needle. Tell your caregiver if you feel dizzy or lightheaded. The needle is withdrawn once the desired amount of fluid has been removed. A sample of the fluid may be sent for examination.  AFTER THE PROCEDURE Your recovery will be assessed and monitored. If there are no problems, as an outpatient, you should be able to go home shortly after the procedure. There may be a very  limited amount of clear fluid draining from the needle insertion site over the next 2 days. Confirm with your caregiver as to the expected amount of drainage. Obtaining the Test Results It is your responsibility to obtain your test results. Do not assume everything is normal if you have not heard from your caregiver or the medical facility. It is important for you to follow up on all of your test results. HOME CARE INSTRUCTIONS   You may resume normal diet and activities as directed or allowed.  Only take over-the-counter or prescription medicines for pain, discomfort, or fever as directed by your caregiver. SEEK IMMEDIATE MEDICAL  CARE IF:  You develop shortness of breath or chest pain.  You develop increasing pain, discomfort, or swelling in your abdomen.  You develop new drainage or pus coming from site where fluid was removed.  You develop swelling or increased redness from site where fluid was removed.  You develop an unexplained temperature of 102 F (38.9 C) or above. Document Released: 09/16/2004 Document Revised: 05/26/2011 Document Reviewed: 10/23/2008 [facility] Patient Information 2014 [place], [place].

## 2013-04-21 NOTE — Progress Notes (Signed)
At the end of the albumin infusion patient states I just feel tired. I got up too early this am an usually just go to bed. VSS. Called US to tell them she is ready for paracentesis. Pt resting in recliner. 10 minutes later daughter came to nurses station and said "her blood sugar is 46" ( checked with her own CBG machine) arrived to room and pt is alert but states" that is the reason I was so tired". Rechecked CBG from Short Stay and confirmed" 46". Pt states she had breakfast and did take her usual morning medication. Pt was given ginger ale and cheese and crackers and took her glucose tablets in her purse. Pt is alert and talking with family. Will recheck her CBG in 15 minutes

## 2013-04-21 NOTE — Procedures (Signed)
Successful US guided paracentesis from [facility].  Yielded 2.1L of clear dark yellow fluid.  No immediate complications.  Pt tolerated well.   Specimen was sent for labs.  Brayton ElBRUNING, Chanse Kagel PA-C 04/21/2013 2:50 PM

## 2013-04-22 LAB — GLUCOSE, CAPILLARY: Glucose-Capillary: 86 mg/dL (ref 70–99)

## 2013-04-22 LAB — ALBUMIN, FLUID (OTHER): ALBUMIN FL: 0.6 g/dL

## 2013-04-24 LAB — BODY FLUID CULTURE
Culture: NO GROWTH
Gram Stain: NONE SEEN

## 2013-05-19 ENCOUNTER — Other Ambulatory Visit (HOSPITAL_COMMUNITY): Payer: Self-pay | Admitting: Gastroenterology

## 2013-05-19 DIAGNOSIS — K7460 Unspecified cirrhosis of liver: Secondary | ICD-10-CM

## 2013-05-20 ENCOUNTER — Other Ambulatory Visit (HOSPITAL_COMMUNITY): Payer: Self-pay | Admitting: Gastroenterology

## 2013-05-20 ENCOUNTER — Ambulatory Visit (HOSPITAL_COMMUNITY)
Admission: RE | Admit: 2013-05-20 | Discharge: 2013-05-20 | Disposition: A | Discharge status: Home / Self Care | Payer: Managed Care, Other (non HMO) | Source: Ambulatory Visit | Attending: Gastroenterology | Admitting: Gastroenterology

## 2013-05-20 ENCOUNTER — Encounter (HOSPITAL_COMMUNITY)
Admission: RE | Admit: 2013-05-20 | Discharge: 2013-05-20 | Disposition: A | Discharge status: Home / Self Care | Payer: Managed Care, Other (non HMO) | Source: Ambulatory Visit | Attending: Gastroenterology | Admitting: Gastroenterology

## 2013-05-20 DIAGNOSIS — Z880 Allergy status to penicillin: Secondary | ICD-10-CM | POA: Insufficient documentation

## 2013-05-20 DIAGNOSIS — K7460 Unspecified cirrhosis of liver: Secondary | ICD-10-CM

## 2013-05-20 DIAGNOSIS — R188 Other ascites: Secondary | ICD-10-CM | POA: Insufficient documentation

## 2013-05-20 DIAGNOSIS — Z888 Allergy status to other drugs, medicaments and biological substances status: Secondary | ICD-10-CM | POA: Insufficient documentation

## 2013-05-20 MED ORDER — ALBUMIN HUMAN 25 % IV SOLN
25.0000 g | Freq: Once | INTRAVENOUS | Status: AC
  Administered 2013-05-20: 25 g via INTRAVENOUS
  Filled 2013-05-20: qty 100

## 2013-05-20 MED ORDER — ALBUMIN HUMAN 25 % IV SOLN
25.0000 g | INTRAVENOUS | Status: DC
  Filled 2013-05-20: qty 100

## 2013-05-26 ENCOUNTER — Other Ambulatory Visit: Payer: Self-pay | Admitting: Family Medicine

## 2013-05-26 ENCOUNTER — Other Ambulatory Visit (HOSPITAL_COMMUNITY)
Admission: RE | Admit: 2013-05-26 | Discharge: 2013-05-26 | Disposition: A | Discharge status: Home / Self Care | Payer: Managed Care, Other (non HMO) | Source: Ambulatory Visit | Attending: Family Medicine | Admitting: Family Medicine

## 2013-05-26 DIAGNOSIS — Z1151 Encounter for screening for human papillomavirus (HPV): Secondary | ICD-10-CM | POA: Insufficient documentation

## 2013-05-26 DIAGNOSIS — Z124 Encounter for screening for malignant neoplasm of cervix: Secondary | ICD-10-CM | POA: Insufficient documentation

## 2013-08-15 ENCOUNTER — Encounter: Payer: Self-pay | Admitting: Nurse Practitioner

## 2013-11-16 ENCOUNTER — Ambulatory Visit (INDEPENDENT_AMBULATORY_CARE_PROVIDER_SITE_OTHER): Payer: Managed Care, Other (non HMO) | Admitting: Nurse Practitioner

## 2013-11-16 ENCOUNTER — Encounter: Payer: Self-pay | Admitting: Nurse Practitioner

## 2013-11-16 VITALS — BP 104/50 | HR 64 | Ht 65.0 in | Wt 197.0 lb

## 2013-11-16 DIAGNOSIS — F079 Unspecified personality and behavioral disorder due to known physiological condition: Secondary | ICD-10-CM

## 2013-11-16 NOTE — Progress Notes (Signed)
I have read the note, and I agree with the clinical assessment and plan.  Dekendrick Uzelac KEITH   

## 2013-11-16 NOTE — Patient Instructions (Signed)
Please fax ammonia level to [phone] Memory score is stable F/U in 8 months

## 2013-11-16 NOTE — Progress Notes (Signed)
[facility]  PATIENT: [name] DOB: [birth year]   REASON FOR VISIT:follow up for mild hepatic encephalopathy    HISTORY OF PRESENT ILLNESS:[name], 58 year old female returns for followup. She was last seen 03/22/13. She has complained with some mild memory problems. She had some mild gait instability but no falls. Last Ammonia level was 80. Dr. Dulce Sellar  treats her cirrhosis, she is on the transplant list. On today's visit she reports her memory is better. She has  had a repeat ammonia level at Dr. Hulen Shouts office she claims.  She returns for reevaluation HISTORY: of nonalcoholic cirrhosis of the liver. The patient is felt to have a mild hepatic encephalopathy. The patient indicates that she is continuing to gradually get worse with her memory. The patient is not clear that she has had a recent ammonia level checked. Patient indicates that she is having episodes where she will "drop things" suddenly from her hand. The patient has gait instability, but she will not fall. The patient is sleeping well at night. The patient believes that her memory has changed since her last visit here. The patient indicates that her varicose veins in the legs are worsening, and she does also have hemorrhoids. The patient returns for an evaluation. MRI the brain was relatively unremarkable with the exception of mild small vessel disease. Blood work to include a copper level and ceruloplasmin level was unremarkable.      REVIEW OF SYSTEMS: Full 14 system review of systems performed and notable only for those listed, all others are neg:  Constitutional: Fatigue Cardiovascular: N/A  Ear/Nose/Throat: N/A  Skin: N/A  Eyes: Blurred vision Respiratory: Shortness of breath Gastroitestinal: Rectal bleeding, constipation Hematology/Lymphatic: N/A  Endocrine: N/A Musculoskeletal: Joint pain, joint swelling, back pain, neck pain Allergy/Immunology: N/A  Neurological: Memory loss, weakness,  tremors Psychiatric: Depression anxiety Sleep : NA   ALLERGIES: Allergies  Allergen Reactions  . Latex Rash  . Actos [Pioglitazone] Swelling and Other (See Comments)    edema Edema   . Byetta 10 Mcg Pen [Exenatide] Other (See Comments)    Stomach upset  . Crestor [Rosuvastatin] Other (See Comments)    Other reaction(s): Liver Disorder Elevated LFT's Increases LFT  . Lipitor [Atorvastatin] Other (See Comments)    Other reaction(s): Muscle Pain Muscle aches  . Other Other (See Comments)    Stomach upset  . Penicillin G Hives  . Penicillins Hives and Itching    All over the body.    HOME MEDICATIONS: Outpatient Prescriptions Prior to Visit  Medication Sig Dispense Refill  . ALPRAZolam (XANAX) 0.5 MG tablet Take 0.5 mg by mouth at bedtime.      [name] Kitchen aspirin EC 81 MG tablet Take 81 mg by mouth every morning.      . cephALEXin (KEFLEX) 500 MG capsule Take 1,000 mg by mouth 2 (two) times daily.       . cholecalciferol (VITAMIN D) 400 UNITS TABS tablet Take 400 Units by mouth every morning.      . clobetasol (OLUX) 0.05 % topical foam Apply 1 application topically 2 (two) times daily.       . clobetasol cream (TEMOVATE) 0.05 % Apply 1 application topically 2 (two) times daily.      . colesevelam (WELCHOL) 625 MG tablet Take 1,875 mg by mouth 2 (two) times daily. Three pills taken per dosage. Total of six per day.      . DULoxetine (CYMBALTA) 60 MG capsule Take 60 mg by mouth every morning.       [name] Kitchen  esomeprazole (NEXIUM) 40 MG capsule Take 40 mg by mouth daily before breakfast.       . ezetimibe (ZETIA) 10 MG tablet Take 10 mg by mouth at bedtime.       . ferrous sulfate 325 (65 FE) MG tablet Take 325 mg by mouth 2 (two) times daily.      . fluconazole (DIFLUCAN) 150 MG tablet Take 150 mg by mouth once as needed (yeast infection).       . fluocinonide cream (LIDEX) 0.05 % Apply 1 application topically 2 (two) times daily.      . furosemide (LASIX) 40 MG tablet Take 40 mg by mouth  every morning.      . gabapentin (NEURONTIN) 300 MG capsule Take 300 mg by mouth at bedtime.       . insulin detemir (LEVEMIR) 100 UNIT/ML injection Inject 80 Units into the skin every morning.       . insulin lispro (HUMALOG) 100 UNIT/ML injection Inject 30 Units into the skin daily with supper.      . lactulose (CHRONULAC) 10 GM/15ML solution Take 20 g by mouth 3 (three) times daily as needed for mild constipation.       . meloxicam (MOBIC) 7.5 MG tablet Take 7.5 mg by mouth every morning.       . polyethylene glycol (MIRALAX / GLYCOLAX) packet Take 17 g by mouth daily as needed for mild constipation. Constipation      . propranolol (INDERAL) 10 MG tablet Take 10 mg by mouth 2 (two) times daily.       . rifaximin (XIFAXAN) 550 MG TABS Take 550 mg by mouth 2 (two) times daily.      [name] Kitchen spironolactone (ALDACTONE) 50 MG tablet Take 50 mg by mouth every morning.       No facility-administered medications prior to visit.    PAST MEDICAL HISTORY: Past Medical History  Diagnosis Date  . Hypertension   . Diabetes mellitus   . Hyperlipidemia   . Fatigue   . Night sweats   . Wears dentures   . Poor appetite   . Retinopathy   . Arthritis   . Rash   . Bruises easily   . Colon polyp   . Hernia   . Rectal bleeding   . Hemorrhoid   . Family history of breast cancer   . Abdominal pain     spasms radiating around to the right side of abdomen   . Shortness of breath   . Night sweats   . Hepatic encephalopathy   . Non-alcoholic fatty liver disease   . Liver cirrhosis     nonalcoholic  . GERD (gastroesophageal reflux disease)   . Depression   . Anxiety   . COPD (chronic obstructive pulmonary disease)     pt denies this  . Anemia   . IBS (irritable bowel syndrome)   . RLS (restless legs syndrome)   . Macular degeneration   . Memory loss     hepatatic encephlopathy  . Degenerative arthritis   . Diabetes 1.5, managed as type 1   . Neuropathy, peripheral     feet legs    PAST  SURGICAL HISTORY: Past Surgical History  Procedure Laterality Date  . Cholecystectomy  11/28/10  . Liver biopsy  Sep 2012  . Hernia repair      belly button  . Esophagogastroduodenoscopy  07/16/2011    Procedure: ESOPHAGOGASTRODUODENOSCOPY (EGD);  Surgeon: Willis Modena, MD;  Location: [facility];  Service: Endoscopy;  Laterality: N/A;  . Cesarean section      FAMILY HISTORY: Family History  Problem Relation Age of Onset  . Cancer Mother   . Hypertension Mother   . Hyperlipidemia Father   . Iron deficiency Sister   . Heart disease Brother   . Hyperlipidemia Brother     SOCIAL HISTORY: History   Social History  . Marital Status: Married    Spouse Name: N/A    Number of Children: 2  . Years of Education: 12   Occupational History  . Unemployed    Social History Main Topics  . Smoking status: Former Smoker    Quit date: 08/21/2005  . Smokeless tobacco: Never Used  . Alcohol Use: No  . Drug Use: No  . Sexual Activity: Yes    Birth Control/ Protection: None   Other Topics Concern  . Not on file   Social History Narrative   Patient is married to [place].    Patient lives at home.    Patient has 2 children.    Patient is currently not working.    Patient has a 12th grade education.      PHYSICAL EXAM  Filed Vitals:   11/16/13 1537  BP: 104/50  Pulse: 64  Height:  (1.651 m)  Weight: 197 lb (89.359 kg)   Body mass index is 32.78 kg/(m^2). Generalized: Well developed, moderately obese female in no acute distress  Neurological examination  Mentation: Alert oriented to time, place, history taking. MMSE 29/30. AFT 11. Follows all commands speech and language fluent  Cranial nerve II-XII: Pupils were equal round reactive to light extraocular movements were full, visual field were full on confrontational test. Facial sensation and strength were normal. hearing was intact to finger rubbing bilaterally. Uvula tongue midline. head turning and shoulder shrug were  normal and symmetric.Tongue protrusion into cheek strength was normal.  Motor: normal bulk and tone, full strength in the BUE, BLE, fine finger movements normal, no pronator drift. No focal weakness  Sensory: normal and symmetric to light touch, pinprick, and vibration  Coordination: finger-nose-finger, heel-to-shin bilaterally, no dysmetria, no asterixis  Reflexes: Brachioradialis 2/2, biceps 2/2, triceps 2/2, patellar 2/2, Achilles 2/2, plantar responses were flexor bilaterally.  Gait and Station: Rising up from seated position without assistance, normal stance, moderate stride, good arm swing, smooth turning, able to perform tiptoe, and heel walking without difficulty. Tandem gait is unsteady   DIAGNOSTIC DATA (LABS, IMAGING, TESTING) -  ASSESSMENT AND PLAN  58 y.o. year old female  has a past medical history of mild memory disturbance, non alcoholic cirrhosis of the liver and gait disorder.  Please fax ammonia level to [phone] Memory score is stable F/U in 8 months Nilda Riggs, [facility], [facility], APRN  [facility] [address] [place], [place] [address] [phone] R. Right ear he then called you informed o is no petechial I will begin when hen

## 2013-11-22 ENCOUNTER — Ambulatory Visit: Payer: Self-pay | Admitting: Nurse Practitioner

## 2013-12-09 ENCOUNTER — Other Ambulatory Visit (HOSPITAL_COMMUNITY): Payer: Self-pay | Admitting: Gastroenterology

## 2013-12-09 DIAGNOSIS — R188 Other ascites: Secondary | ICD-10-CM

## 2013-12-09 DIAGNOSIS — K7469 Other cirrhosis of liver: Secondary | ICD-10-CM

## 2013-12-13 ENCOUNTER — Ambulatory Visit (HOSPITAL_COMMUNITY)
Admission: RE | Admit: 2013-12-13 | Discharge: 2013-12-13 | Disposition: A | Discharge status: Home / Self Care | Payer: Managed Care, Other (non HMO) | Source: Ambulatory Visit | Attending: Gastroenterology | Admitting: Gastroenterology

## 2013-12-13 DIAGNOSIS — K7460 Unspecified cirrhosis of liver: Secondary | ICD-10-CM | POA: Insufficient documentation

## 2013-12-13 DIAGNOSIS — K7469 Other cirrhosis of liver: Secondary | ICD-10-CM

## 2013-12-13 DIAGNOSIS — R188 Other ascites: Secondary | ICD-10-CM

## 2013-12-13 NOTE — Procedures (Signed)
US guided therapeutic paracentesis performed yielding 2 liters yellow fluid. No immediate complications. 

## 2013-12-15 ENCOUNTER — Inpatient Hospital Stay (HOSPITAL_COMMUNITY)
Admission: EM | Admit: 2013-12-15 | Discharge: 2013-12-19 | DRG: 442 | Disposition: A | Discharge status: Home / Self Care | Payer: Managed Care, Other (non HMO) | Attending: Internal Medicine | Admitting: Internal Medicine

## 2013-12-15 ENCOUNTER — Encounter (HOSPITAL_COMMUNITY): Payer: Self-pay | Admitting: Emergency Medicine

## 2013-12-15 DIAGNOSIS — Z880 Allergy status to penicillin: Secondary | ICD-10-CM | POA: Diagnosis not present

## 2013-12-15 DIAGNOSIS — D638 Anemia in other chronic diseases classified elsewhere: Secondary | ICD-10-CM | POA: Diagnosis present

## 2013-12-15 DIAGNOSIS — Z794 Long term (current) use of insulin: Secondary | ICD-10-CM | POA: Diagnosis not present

## 2013-12-15 DIAGNOSIS — M1990 Unspecified osteoarthritis, unspecified site: Secondary | ICD-10-CM | POA: Diagnosis present

## 2013-12-15 DIAGNOSIS — E11319 Type 2 diabetes mellitus with unspecified diabetic retinopathy without macular edema: Secondary | ICD-10-CM | POA: Diagnosis present

## 2013-12-15 DIAGNOSIS — F329 Major depressive disorder, single episode, unspecified: Secondary | ICD-10-CM | POA: Diagnosis present

## 2013-12-15 DIAGNOSIS — K7581 Nonalcoholic steatohepatitis (NASH): Secondary | ICD-10-CM | POA: Diagnosis present

## 2013-12-15 DIAGNOSIS — D509 Iron deficiency anemia, unspecified: Secondary | ICD-10-CM | POA: Diagnosis present

## 2013-12-15 DIAGNOSIS — K5900 Constipation, unspecified: Secondary | ICD-10-CM | POA: Diagnosis present

## 2013-12-15 DIAGNOSIS — I10 Essential (primary) hypertension: Secondary | ICD-10-CM | POA: Diagnosis present

## 2013-12-15 DIAGNOSIS — R188 Other ascites: Secondary | ICD-10-CM | POA: Diagnosis present

## 2013-12-15 DIAGNOSIS — E1140 Type 2 diabetes mellitus with diabetic neuropathy, unspecified: Secondary | ICD-10-CM | POA: Diagnosis present

## 2013-12-15 DIAGNOSIS — Z9049 Acquired absence of other specified parts of digestive tract: Secondary | ICD-10-CM | POA: Diagnosis present

## 2013-12-15 DIAGNOSIS — Z79899 Other long term (current) drug therapy: Secondary | ICD-10-CM | POA: Diagnosis not present

## 2013-12-15 DIAGNOSIS — R140 Abdominal distension (gaseous): Secondary | ICD-10-CM

## 2013-12-15 DIAGNOSIS — K7290 Hepatic failure, unspecified without coma: Secondary | ICD-10-CM | POA: Diagnosis present

## 2013-12-15 DIAGNOSIS — F419 Anxiety disorder, unspecified: Secondary | ICD-10-CM | POA: Diagnosis present

## 2013-12-15 DIAGNOSIS — J449 Chronic obstructive pulmonary disease, unspecified: Secondary | ICD-10-CM | POA: Diagnosis present

## 2013-12-15 DIAGNOSIS — Z8249 Family history of ischemic heart disease and other diseases of the circulatory system: Secondary | ICD-10-CM

## 2013-12-15 DIAGNOSIS — Z91040 Latex allergy status: Secondary | ICD-10-CM

## 2013-12-15 DIAGNOSIS — Z7982 Long term (current) use of aspirin: Secondary | ICD-10-CM

## 2013-12-15 DIAGNOSIS — J90 Pleural effusion, not elsewhere classified: Secondary | ICD-10-CM

## 2013-12-15 DIAGNOSIS — K7460 Unspecified cirrhosis of liver: Secondary | ICD-10-CM | POA: Diagnosis present

## 2013-12-15 DIAGNOSIS — H3530 Unspecified macular degeneration: Secondary | ICD-10-CM | POA: Diagnosis present

## 2013-12-15 DIAGNOSIS — K589 Irritable bowel syndrome without diarrhea: Secondary | ICD-10-CM | POA: Diagnosis present

## 2013-12-15 DIAGNOSIS — Z792 Long term (current) use of antibiotics: Secondary | ICD-10-CM | POA: Diagnosis not present

## 2013-12-15 DIAGNOSIS — Z87891 Personal history of nicotine dependence: Secondary | ICD-10-CM

## 2013-12-15 DIAGNOSIS — Z86010 Personal history of colonic polyps: Secondary | ICD-10-CM

## 2013-12-15 DIAGNOSIS — Z9109 Other allergy status, other than to drugs and biological substances: Secondary | ICD-10-CM | POA: Diagnosis not present

## 2013-12-15 DIAGNOSIS — E785 Hyperlipidemia, unspecified: Secondary | ICD-10-CM | POA: Diagnosis present

## 2013-12-15 DIAGNOSIS — G2581 Restless legs syndrome: Secondary | ICD-10-CM | POA: Diagnosis present

## 2013-12-15 DIAGNOSIS — E119 Type 2 diabetes mellitus without complications: Secondary | ICD-10-CM

## 2013-12-15 DIAGNOSIS — F32A Depression, unspecified: Secondary | ICD-10-CM | POA: Diagnosis present

## 2013-12-15 LAB — URINALYSIS, ROUTINE W REFLEX MICROSCOPIC
Glucose, UA: >1000 mg/dL — AB
KETONES UR: NEGATIVE mg/dL
NITRITE: POSITIVE — AB
PH: 5.5 (ref 5.0–8.0)
PROTEIN: 30 mg/dL — AB
Specific Gravity, Urine: 1.044 — ABNORMAL HIGH (ref 1.005–1.030)
UROBILINOGEN UA: 1 mg/dL (ref 0.0–1.0)

## 2013-12-15 LAB — CBC WITH DIFFERENTIAL/PLATELET
BASOS ABS: 0 10*3/uL (ref 0.0–0.1)
BASOS PCT: 0 % (ref 0–1)
Basophils Absolute: 0 10*3/uL (ref 0.0–0.1)
Basophils Relative: 0 % (ref 0–1)
Eosinophils Absolute: 0.2 10*3/uL (ref 0.0–0.7)
Eosinophils Absolute: 0.1 10*3/uL (ref 0.0–0.7)
Eosinophils Relative: 2 % (ref 0–5)
Eosinophils Relative: 1 % (ref 0–5)
HCT: 41.9 % (ref 36.0–46.0)
HEMATOCRIT: 41.5 % (ref 36.0–46.0)
HEMOGLOBIN: 14.3 g/dL (ref 12.0–15.0)
Hemoglobin: 14.5 g/dL (ref 12.0–15.0)
LYMPHS ABS: 2.5 10*3/uL (ref 0.7–4.0)
LYMPHS PCT: 27 % (ref 12–46)
LYMPHS PCT: 28 % (ref 12–46)
Lymphs Abs: 2.4 10*3/uL (ref 0.7–4.0)
MCH: 35.1 pg — ABNORMAL HIGH (ref 26.0–34.0)
MCH: 34.9 pg — ABNORMAL HIGH (ref 26.0–34.0)
MCHC: 34.6 g/dL (ref 30.0–36.0)
MCHC: 34.5 g/dL (ref 30.0–36.0)
MCV: 101.5 fL — AB (ref 78.0–100.0)
MCV: 101.2 fL — ABNORMAL HIGH (ref 78.0–100.0)
MONO ABS: 1.1 10*3/uL — AB (ref 0.1–1.0)
MONO ABS: 1.1 10*3/uL — AB (ref 0.1–1.0)
MONOS PCT: 13 % — AB (ref 3–12)
Monocytes Relative: 12 % (ref 3–12)
NEUTROS ABS: 5 10*3/uL (ref 1.7–7.7)
NEUTROS PCT: 58 % (ref 43–77)
Neutro Abs: 5.5 10*3/uL (ref 1.7–7.7)
Neutrophils Relative %: 59 % (ref 43–77)
Platelets: 127 10*3/uL — ABNORMAL LOW (ref 150–400)
Platelets: 126 10*3/uL — ABNORMAL LOW (ref 150–400)
RBC: 4.13 MIL/uL (ref 3.87–5.11)
RBC: 4.1 MIL/uL (ref 3.87–5.11)
RDW: 14.9 % (ref 11.5–15.5)
RDW: 14.9 % (ref 11.5–15.5)
WBC: 9.1 10*3/uL (ref 4.0–10.5)
WBC: 8.7 10*3/uL (ref 4.0–10.5)

## 2013-12-15 LAB — COMPREHENSIVE METABOLIC PANEL
ALK PHOS: 105 U/L (ref 39–117)
ALT: 37 U/L — ABNORMAL HIGH (ref 0–35)
ALT: 31 U/L (ref 0–35)
AST: 67 U/L — AB (ref 0–37)
AST: 60 U/L — AB (ref 0–37)
Albumin: 2.4 g/dL — ABNORMAL LOW (ref 3.5–5.2)
Albumin: 2.4 g/dL — ABNORMAL LOW (ref 3.5–5.2)
Alkaline Phosphatase: 114 U/L (ref 39–117)
Anion gap: 9 (ref 5–15)
Anion gap: 7 (ref 5–15)
BILIRUBIN TOTAL: 4.5 mg/dL — AB (ref 0.3–1.2)
BUN: 9 mg/dL (ref 6–23)
BUN: 11 mg/dL (ref 6–23)
CALCIUM: 8.9 mg/dL (ref 8.4–10.5)
CHLORIDE: 101 meq/L (ref 96–112)
CO2: 25 meq/L (ref 19–32)
CO2: 26 mEq/L (ref 19–32)
CREATININE: 0.44 mg/dL — AB (ref 0.50–1.10)
CREATININE: 0.54 mg/dL (ref 0.50–1.10)
Calcium: 8.8 mg/dL (ref 8.4–10.5)
Chloride: 105 mEq/L (ref 96–112)
GFR calc Af Amer: >90 mL/min (ref >90)
GFR calc non Af Amer: >90 mL/min (ref >90)
Glucose, Bld: 129 mg/dL — ABNORMAL HIGH (ref 70–99)
Glucose, Bld: 82 mg/dL (ref 70–99)
POTASSIUM: 4.4 meq/L (ref 3.7–5.3)
Potassium: 4.3 mEq/L (ref 3.7–5.3)
Sodium: 139 mEq/L (ref 137–147)
Sodium: 134 mEq/L — ABNORMAL LOW (ref 137–147)
Total Bilirubin: 4.4 mg/dL — ABNORMAL HIGH (ref 0.3–1.2)
Total Protein: 8.1 g/dL (ref 6.0–8.3)
Total Protein: 7.7 g/dL (ref 6.0–8.3)

## 2013-12-15 LAB — PROTIME-INR
INR: 2.1 — ABNORMAL HIGH (ref 0.00–1.49)
INR: 2.09 — ABNORMAL HIGH (ref 0.00–1.49)
Prothrombin Time: 23.8 seconds — ABNORMAL HIGH (ref 11.6–15.2)
Prothrombin Time: 23.7 seconds — ABNORMAL HIGH (ref 11.6–15.2)

## 2013-12-15 LAB — URINE MICROSCOPIC-ADD ON

## 2013-12-15 LAB — GLUCOSE, CAPILLARY
GLUCOSE-CAPILLARY: 157 mg/dL — AB (ref 70–99)
Glucose-Capillary: 98 mg/dL (ref 70–99)

## 2013-12-15 LAB — MAGNESIUM: Magnesium: 2.3 mg/dL (ref 1.5–2.5)

## 2013-12-15 LAB — AMMONIA: Ammonia: 30 umol/L (ref 11–60)

## 2013-12-15 LAB — APTT: APTT: 49 s — AB (ref 24–37)

## 2013-12-15 LAB — CBG MONITORING, ED: Glucose-Capillary: 95 mg/dL (ref 70–99)

## 2013-12-15 LAB — LIPASE, BLOOD: Lipase: 47 U/L (ref 11–59)

## 2013-12-15 LAB — PHOSPHORUS: Phosphorus: 3.5 mg/dL (ref 2.3–4.6)

## 2013-12-15 MED ORDER — ZINC GLUCONATE 50 MG PO TABS: 50.0000 mg | ORAL_TABLET | Freq: Every day | ORAL | Status: DC

## 2013-12-15 MED ORDER — ONDANSETRON HCL 4 MG/2ML IJ SOLN
4.0000 mg | Freq: Four times a day (QID) | INTRAMUSCULAR | Status: DC | PRN
  Administered 2013-12-16 – 2013-12-17 (×2): 4 mg via INTRAVENOUS
  Filled 2013-12-15 (×2): qty 2

## 2013-12-15 MED ORDER — INSULIN ASPART 100 UNIT/ML ~~LOC~~ SOLN
0.0000 [IU] | Freq: Three times a day (TID) | SUBCUTANEOUS | Status: DC
  Administered 2013-12-17 – 2013-12-18 (×2): 3 [IU] via SUBCUTANEOUS
  Administered 2013-12-18: 2 [IU] via SUBCUTANEOUS
  Administered 2013-12-19: 3 [IU] via SUBCUTANEOUS

## 2013-12-15 MED ORDER — SODIUM CHLORIDE 0.9 % IV SOLN
INTRAVENOUS | Status: DC
  Administered 2013-12-15: 18:00:00 via INTRAVENOUS

## 2013-12-15 MED ORDER — ASPIRIN EC 81 MG PO TBEC
81.0000 mg | DELAYED_RELEASE_TABLET | Freq: Every morning | ORAL | Status: DC
  Administered 2013-12-16 – 2013-12-19 (×4): 81 mg via ORAL
  Filled 2013-12-15 (×4): qty 1

## 2013-12-15 MED ORDER — CLOBETASOL PROPIONATE 0.05 % EX FOAM: 1.0000 "application " | Freq: Two times a day (BID) | CUTANEOUS | Status: DC

## 2013-12-15 MED ORDER — FUROSEMIDE 10 MG/ML IJ SOLN
40.0000 mg | Freq: Once | INTRAMUSCULAR | Status: AC
  Administered 2013-12-15: 40 mg via INTRAVENOUS
  Filled 2013-12-15: qty 4

## 2013-12-15 MED ORDER — INSULIN DETEMIR 100 UNIT/ML ~~LOC~~ SOLN
80.0000 [IU] | Freq: Every morning | SUBCUTANEOUS | Status: DC
  Administered 2013-12-16 – 2013-12-19 (×4): 80 [IU] via SUBCUTANEOUS
  Filled 2013-12-15 (×4): qty 0.8

## 2013-12-15 MED ORDER — NAPROXEN 500 MG PO TABS
500.0000 mg | ORAL_TABLET | Freq: Once | ORAL | Status: AC
  Administered 2013-12-15: 500 mg via ORAL
  Filled 2013-12-15: qty 1

## 2013-12-15 MED ORDER — INSULIN ASPART 100 UNIT/ML ~~LOC~~ SOLN
14.0000 [IU] | Freq: Every day | SUBCUTANEOUS | Status: DC
  Administered 2013-12-18: 19:00:00 via SUBCUTANEOUS

## 2013-12-15 MED ORDER — DULOXETINE HCL 60 MG PO CPEP
60.0000 mg | ORAL_CAPSULE | Freq: Every morning | ORAL | Status: DC
  Administered 2013-12-16 – 2013-12-19 (×4): 60 mg via ORAL
  Filled 2013-12-15 (×4): qty 1

## 2013-12-15 MED ORDER — INSULIN ASPART 100 UNIT/ML ~~LOC~~ SOLN: 0.0000 [IU] | Freq: Every day | SUBCUTANEOUS | Status: DC

## 2013-12-15 MED ORDER — ACETAMINOPHEN 325 MG PO TABS
650.0000 mg | ORAL_TABLET | Freq: Four times a day (QID) | ORAL | Status: DC | PRN
  Administered 2013-12-16: 650 mg via ORAL
  Filled 2013-12-15: qty 2

## 2013-12-15 MED ORDER — GABAPENTIN 300 MG PO CAPS
300.0000 mg | ORAL_CAPSULE | Freq: Every day | ORAL | Status: DC
  Administered 2013-12-15 – 2013-12-18 (×4): 300 mg via ORAL
  Filled 2013-12-15 (×5): qty 1

## 2013-12-15 MED ORDER — EMPAGLIFLOZIN 10 MG PO TABS
10.0000 mg | ORAL_TABLET | Freq: Every day | ORAL | Status: DC
  Administered 2013-12-16 – 2013-12-17 (×2): 10 mg via ORAL
  Administered 2013-12-18: 11:00:00 via ORAL
  Administered 2013-12-19: 10 mg via ORAL

## 2013-12-15 MED ORDER — DIPHENHYDRAMINE HCL 25 MG PO TABS
25.0000 mg | ORAL_TABLET | Freq: Four times a day (QID) | ORAL | Status: DC | PRN
  Filled 2013-12-15: qty 1

## 2013-12-15 MED ORDER — ONDANSETRON HCL 4 MG PO TABS: 4.0000 mg | ORAL_TABLET | Freq: Four times a day (QID) | ORAL | Status: DC | PRN

## 2013-12-15 MED ORDER — ONDANSETRON HCL 4 MG/2ML IJ SOLN: 4.0000 mg | Freq: Three times a day (TID) | INTRAMUSCULAR | Status: DC | PRN

## 2013-12-15 MED ORDER — FLUOCINONIDE 0.05 % EX CREA
TOPICAL_CREAM | Freq: Two times a day (BID) | CUTANEOUS | Status: DC
  Administered 2013-12-15 – 2013-12-19 (×7): via TOPICAL
  Filled 2013-12-15: qty 15

## 2013-12-15 MED ORDER — CHOLECALCIFEROL 10 MCG (400 UNIT) PO TABS
400.0000 [IU] | ORAL_TABLET | Freq: Every morning | ORAL | Status: DC
  Administered 2013-12-16 – 2013-12-19 (×4): 400 [IU] via ORAL
  Filled 2013-12-15 (×4): qty 1

## 2013-12-15 MED ORDER — SODIUM CHLORIDE 0.9 % IJ SOLN: 3.0000 mL | Freq: Two times a day (BID) | INTRAMUSCULAR | Status: DC

## 2013-12-15 MED ORDER — DIPHENHYDRAMINE HCL 25 MG PO CAPS: 25.0000 mg | ORAL_CAPSULE | Freq: Four times a day (QID) | ORAL | Status: DC | PRN

## 2013-12-15 MED ORDER — PANTOPRAZOLE SODIUM 40 MG PO TBEC: 40.0000 mg | DELAYED_RELEASE_TABLET | Freq: Every day | ORAL | Status: DC

## 2013-12-15 MED ORDER — SODIUM CHLORIDE 0.9 % IJ SOLN: 3.0000 mL | INTRAMUSCULAR | Status: DC | PRN

## 2013-12-15 MED ORDER — COLESEVELAM HCL 625 MG PO TABS
1875.0000 mg | ORAL_TABLET | Freq: Two times a day (BID) | ORAL | Status: DC
  Administered 2013-12-15 – 2013-12-19 (×8): 1875 mg via ORAL
  Filled 2013-12-15 (×11): qty 3

## 2013-12-15 MED ORDER — RIFAXIMIN 550 MG PO TABS
550.0000 mg | ORAL_TABLET | Freq: Two times a day (BID) | ORAL | Status: DC
  Administered 2013-12-15 – 2013-12-19 (×8): 550 mg via ORAL
  Filled 2013-12-15 (×9): qty 1

## 2013-12-15 MED ORDER — PANTOPRAZOLE SODIUM 40 MG PO TBEC
40.0000 mg | DELAYED_RELEASE_TABLET | Freq: Every day | ORAL | Status: DC
  Administered 2013-12-16 – 2013-12-19 (×4): 40 mg via ORAL
  Filled 2013-12-15 (×4): qty 1

## 2013-12-15 MED ORDER — CLOBETASOL PROPIONATE 0.05 % EX CREA
1.0000 "application " | TOPICAL_CREAM | Freq: Two times a day (BID) | CUTANEOUS | Status: DC
  Administered 2013-12-15 – 2013-12-19 (×6): 1 via TOPICAL
  Filled 2013-12-15: qty 15

## 2013-12-15 MED ORDER — EZETIMIBE 10 MG PO TABS
10.0000 mg | ORAL_TABLET | Freq: Every day | ORAL | Status: DC
  Administered 2013-12-15 – 2013-12-18 (×4): 10 mg via ORAL
  Filled 2013-12-15 (×5): qty 1

## 2013-12-15 MED ORDER — LACTULOSE 10 GM/15ML PO SOLN: 20.0000 g | Freq: Three times a day (TID) | ORAL | Status: DC | PRN

## 2013-12-15 MED ORDER — SPIRONOLACTONE 50 MG PO TABS
50.0000 mg | ORAL_TABLET | Freq: Every morning | ORAL | Status: DC
  Administered 2013-12-16 – 2013-12-19 (×4): 50 mg via ORAL
  Filled 2013-12-15 (×4): qty 1

## 2013-12-15 MED ORDER — FLUOCINONIDE 0.05 % EX CREA
1.0000 "application " | TOPICAL_CREAM | Freq: Two times a day (BID) | CUTANEOUS | Status: DC
  Filled 2013-12-15: qty 30

## 2013-12-15 MED ORDER — SULFAMETHOXAZOLE-TMP DS 800-160 MG PO TABS
1.0000 | ORAL_TABLET | Freq: Once | ORAL | Status: AC
  Administered 2013-12-15: 1 via ORAL
  Filled 2013-12-15: qty 1

## 2013-12-15 MED ORDER — PROPRANOLOL HCL 10 MG PO TABS
10.0000 mg | ORAL_TABLET | Freq: Two times a day (BID) | ORAL | Status: DC
  Administered 2013-12-15 – 2013-12-19 (×8): 10 mg via ORAL
  Filled 2013-12-15 (×9): qty 1

## 2013-12-15 MED ORDER — IPRATROPIUM-ALBUTEROL 0.5-2.5 (3) MG/3ML IN SOLN: 3.0000 mL | RESPIRATORY_TRACT | Status: DC | PRN

## 2013-12-15 MED ORDER — SODIUM CHLORIDE 0.9 % IV SOLN: 250.0000 mL | INTRAVENOUS | Status: DC | PRN

## 2013-12-15 MED ORDER — FERROUS SULFATE 325 (65 FE) MG PO TABS
325.0000 mg | ORAL_TABLET | Freq: Two times a day (BID) | ORAL | Status: DC
  Administered 2013-12-17 – 2013-12-19 (×5): 325 mg via ORAL
  Filled 2013-12-15 (×10): qty 1

## 2013-12-15 MED ORDER — MELOXICAM 7.5 MG PO TABS
7.5000 mg | ORAL_TABLET | Freq: Every morning | ORAL | Status: DC
  Administered 2013-12-16 – 2013-12-19 (×4): 7.5 mg via ORAL
  Filled 2013-12-15 (×4): qty 1

## 2013-12-15 MED ORDER — ACETAMINOPHEN 650 MG RE SUPP: 650.0000 mg | Freq: Four times a day (QID) | RECTAL | Status: DC | PRN

## 2013-12-15 NOTE — ED Provider Notes (Signed)
CSN: [id number]     Arrival date & time 12/15/13  1002 History   First MD Initiated Contact with Patient 12/15/13 1127     Chief Complaint  Patient presents with  . Abdominal Pain  . Post-op Problem     (Consider location/radiation/quality/duration/timing/severity/associated sxs/prior Treatment) HPI Comments: 58 year old female, history of nonalcoholic liver failure status post paracentesis frequently, last took off 2 days ago 2 L. Since that time she has had increasing pain especially in her upper abdomen. She feels as though her swelling is much worse than it was, she is having vomiting as well as diarrhea and feels distended. Symptoms are persisting, gradually worsening, nothing makes this better or worse, no associated fevers or altered mental status.  Patient is a 58 y.o. female presenting with abdominal pain. The history is provided by the patient and medical records.  Abdominal Pain   Past Medical History  Diagnosis Date  . Hypertension   . Diabetes mellitus   . Hyperlipidemia   . Fatigue   . Night sweats   . Wears dentures   . Poor appetite   . Retinopathy   . Arthritis   . Rash   . Bruises easily   . Colon polyp   . Hernia   . Rectal bleeding   . Hemorrhoid   . Family history of breast cancer   . Abdominal pain     spasms radiating around to the right side of abdomen   . Shortness of breath   . Night sweats   . Hepatic encephalopathy   . Non-alcoholic fatty liver disease   . Liver cirrhosis     nonalcoholic  . GERD (gastroesophageal reflux disease)   . Depression   . Anxiety   . COPD (chronic obstructive pulmonary disease)     pt denies this  . Anemia   . IBS (irritable bowel syndrome)   . RLS (restless legs syndrome)   . Macular degeneration   . Memory loss     hepatatic encephlopathy  . Degenerative arthritis   . Diabetes 1.5, managed as type 1   . Neuropathy, peripheral     feet legs   Past Surgical History  Procedure Laterality Date  .  Cholecystectomy  11/28/10  . Liver biopsy  Sep 2012  . Hernia repair      belly button  . Esophagogastroduodenoscopy  07/16/2011    Procedure: ESOPHAGOGASTRODUODENOSCOPY (EGD);  Surgeon: Willis ModenaWilliam Outlaw, MD;  Location: [facility];  Service: Endoscopy;  Laterality: N/A;  . Cesarean section     Family History  Problem Relation Age of Onset  . Cancer Mother   . Hypertension Mother   . Hyperlipidemia Father   . Iron deficiency Sister   . Heart disease Brother   . Hyperlipidemia Brother    History  Substance Use Topics  . Smoking status: Former Smoker    Quit date: 08/21/2005  . Smokeless tobacco: Never Used  . Alcohol Use: No   OB History   Grav Para Term Preterm Abortions TAB SAB Ect Mult Living                 Review of Systems  Gastrointestinal: Positive for abdominal pain.  All other systems reviewed and are negative.     Allergies  Latex; Actos; Byetta 10 mcg pen; Crestor; Lipitor; Other; Penicillin g; and Penicillins  Home Medications   Prior to Admission medications   Medication Sig Start Date End Date Taking? Authorizing Provider  aspirin EC 81 MG tablet Take  81 mg by mouth every morning.   Yes Historical Provider, MD  cholecalciferol (VITAMIN D) 400 UNITS TABS tablet Take 400 Units by mouth every morning.   Yes Historical Provider, MD  clobetasol (OLUX) 0.05 % topical foam Apply 1 application topically 2 (two) times daily.  02/15/13  Yes Historical Provider, MD  clobetasol cream (TEMOVATE) 0.05 % Apply 1 application topically 2 (two) times daily.   Yes Historical Provider, MD  colesevelam (WELCHOL) 625 MG tablet Take 1,875 mg by mouth 2 (two) times daily. Three pills taken per dosage. Total of six per day.   Yes Historical Provider, MD  diphenhydrAMINE (BENADRYL) 25 MG tablet Take 25 mg by mouth every 6 (six) hours as needed for itching (itching).   Yes Historical Provider, MD  DULoxetine (CYMBALTA) 60 MG capsule Take 60 mg by mouth every morning.    Yes Historical  Provider, MD  Empagliflozin (JARDIANCE) 10 MG TABS Take 10 mg by mouth daily.   Yes Historical Provider, MD  esomeprazole (NEXIUM) 40 MG capsule Take 40 mg by mouth daily before breakfast.    Yes Historical Provider, MD  ezetimibe (ZETIA) 10 MG tablet Take 10 mg by mouth at bedtime.    Yes Historical Provider, MD  ferrous sulfate 325 (65 FE) MG tablet Take 325 mg by mouth 2 (two) times daily.   Yes Historical Provider, MD  fluocinonide cream (LIDEX) 0.05 % Apply 1 application topically 2 (two) times daily.   Yes Historical Provider, MD  gabapentin (NEURONTIN) 300 MG capsule Take 300 mg by mouth at bedtime.    Yes Historical Provider, MD  insulin detemir (LEVEMIR) 100 UNIT/ML injection Inject 80 Units into the skin every morning.    Yes Historical Provider, MD  insulin lispro (HUMALOG) 100 UNIT/ML injection Inject 14 Units into the skin daily with supper.    Yes Historical Provider, MD  lactulose (CHRONULAC) 10 GM/15ML solution Take 20 g by mouth 3 (three) times daily as needed for mild constipation (constipation).    Yes Historical Provider, MD  meloxicam (MOBIC) 7.5 MG tablet Take 7.5 mg by mouth every morning.  02/19/13  Yes Historical Provider, MD  omeprazole (PRILOSEC) 20 MG capsule Take 20 mg by mouth daily.   Yes Historical Provider, MD  propranolol (INDERAL) 10 MG tablet Take 10 mg by mouth 2 (two) times daily.  02/20/13  Yes Historical Provider, MD  rifaximin (XIFAXAN) 550 MG TABS tablet Take 550 mg by mouth 2 (two) times daily.   Yes Historical Provider, MD  spironolactone (ALDACTONE) 50 MG tablet Take 50 mg by mouth every morning.   Yes Historical Provider, MD  zinc gluconate 50 MG tablet Take 50 mg by mouth daily.   Yes Historical Provider, MD   BP 134/55  Pulse 68  Temp(Src) 98.5 F (36.9 C) (Oral)  Resp 15  SpO2 96% Physical Exam  Nursing note and vitals reviewed. Constitutional: She appears well-developed and well-nourished. No distress.  HENT:  Head: Normocephalic and  atraumatic.  Mouth/Throat: Oropharynx is clear and moist. No oropharyngeal exudate.  Eyes: Conjunctivae and EOM are normal. Pupils are equal, round, and reactive to light. Right eye exhibits no discharge. Left eye exhibits no discharge. No scleral icterus.  Neck: Normal range of motion. Neck supple. No JVD present. No thyromegaly present.  Cardiovascular: Normal rate, regular rhythm, normal heart sounds and intact distal pulses.  Exam reveals no gallop and no friction rub.   No murmur heard. Pulmonary/Chest: Effort normal and breath sounds normal. No respiratory distress.  She has no wheezes. She has no rales.  Abdominal: Soft. Bowel sounds are normal. She exhibits distension. She exhibits no mass. There is tenderness ( Right upper quadrant tenderness, no other abdominal tenderness).  Musculoskeletal: Normal range of motion. She exhibits no edema and no tenderness.  Lymphadenopathy:    She has no cervical adenopathy.  Neurological: She is alert. Coordination normal.  Skin: Skin is warm and dry. No rash noted. No erythema.  Psychiatric: She has a normal mood and affect. Her behavior is normal.    ED Course  Procedures (including critical care time) Labs Review Labs Reviewed  CBC WITH DIFFERENTIAL - Abnormal; Notable for the following:    MCV 101.5 (*)    MCH 35.1 (*)    Platelets 127 (*)    Monocytes Absolute 1.1 (*)    All other components within normal limits  COMPREHENSIVE METABOLIC PANEL - Abnormal; Notable for the following:    Glucose, Bld 129 (*)    Creatinine, Ser 0.44 (*)    Albumin 2.4 (*)    AST 67 (*)    ALT 37 (*)    Total Bilirubin 4.4 (*)    All other components within normal limits  URINALYSIS, ROUTINE W REFLEX MICROSCOPIC - Abnormal; Notable for the following:    Color, Urine ORANGE (*)    APPearance CLOUDY (*)    Specific Gravity, Urine 1.044 (*)    Glucose, UA >1000 (*)    Hgb urine dipstick MODERATE (*)    Bilirubin Urine SMALL (*)    Protein, ur 30 (*)     Nitrite POSITIVE (*)    Leukocytes, UA SMALL (*)    All other components within normal limits  PROTIME-INR - Abnormal; Notable for the following:    Prothrombin Time 23.8 (*)    INR 2.10 (*)    All other components within normal limits  URINE MICROSCOPIC-ADD ON - Abnormal; Notable for the following:    Squamous Epithelial / LPF MANY (*)    Bacteria, UA FEW (*)    Crystals CA OXALATE CRYSTALS (*)    All other components within normal limits  LIPASE, BLOOD  AMMONIA  CBG MONITORING, ED    Imaging Review     MDM   Final diagnoses:  Ascites    The patient does appear to have recurrent fluid. Bedside ultrasound shows large volume ascites, bilirubin also elevated since last checked, labs pending last bilirubin was 2 years ago. It was 1.5 at that time. Her husband states she  appears slightly jaundiced starting yesterday.  D/w hospitalist - will admit.  Ultrasound limited abdominal and limited transthoracic ultrasound (FAST)  Indication: abdominal distension Four views were obtained using the low frequency transducer: Splenorenal, Hepatorenal, Retrovesical, Pericardial subxyphoid Interpretation: No free fluid visualized surrounding the kidneys, pelvis or pericardium. Images archived electronically Dr. Hyacinth Meeker personally performed and interpreted the images   Vida Roller, MD 12/15/13 [phone]

## 2013-12-15 NOTE — ED Notes (Signed)
Per md pt allowed to eat food and have fluids

## 2013-12-15 NOTE — ED Notes (Signed)
Pt has non-alcoholic cirrhosis of liver.  Pt had paracentesis done Tuesday.  Took 2 L off.  Pt has since been complaining of gen abd pain.  States that the swelling did not go down and states she feels very swollen now.  C/o vomiting and diarrhea.  States she has gastroparesis.

## 2013-12-15 NOTE — ED Notes (Signed)
Report given to Kirsten, RN

## 2013-12-15 NOTE — H&P (Addendum)
[facility] History and Physical  [name] KGM:[id number] DOB: [birth year] DOA: 12/15/2013  Referring physician: ER physician PCP: [name]   Chief Complaint: abdominal distention, pain   HPI:  58 year old female with past medical history of NASH, frequent paracenteses (last 12/13/13 with 2 L fluid drained), diabetes, anemia, dyslipidemia who [facility] 12/15/2013 with abdominal pain ever since having paracentesis 12/13/13. Patient reported her swelling is still about the same and she has mid abdominal pain, 8/10 in intensity. Patient also reported non bloody diarrhea, vomiting occasionally but not consistently. No reports of chest pain, cough, fevers, chills. No shortness of breath. No reports of lightheadedness or loss of consciousness. No mental status changes. In ED, BP was 107/48, HR 78, RR 15-20, T max 98.4 F and oxygen saturation 89% on room air. Blood work revealed platelets 127, INR 2.10 otherwise unremarkable. Patient admitted for further evaluation and need for yet additional paracentesis.  Assessment & Plan    Principal Problem:   Abdominal distension / Ascites / Nausea and vomiting   Recent paracentesis 12/13/2013. Order placed for paracentesis on this admission.  Symptomatic management for now with analgesia as needed Active Problems:   NASH (nonalcoholic steatohepatitis)  Resume home meds including rifaximin, spironolactone, propranolol, PPI therapy  Hold lactulose due to diarrhea    Dyslipidemia  Resume wellchol and zetia   Diabetes, no mention if controlled, neuropathic complications  Check A1c. Resume home insulin regimen. Start SSI.  Gabapentin for neuropathy.   Anemia of chronic disease and iron deficiency anemia  Secondary to history of liver disease  Continue iron supplementation   Hemoglobin WNL   Depression  Resume cymbalta   DVT prophylaxis:   SCD's bilaterally due to slight thrombocytopenia   Radiological Exams  on Admission: No results found.   Code Status: Full Family Communication: Plan of care discussed with the patient  Disposition Plan: Admit for further evaluation  Michelle PasseyEVINE, [name], Galvan  [facility] Pager [phone]  Review of Systems:  Constitutional: Negative for fever, chills and malaise/fatigue. Negative for diaphoresis.  HENT: Negative for hearing loss, ear pain, nosebleeds, congestion, sore throat, neck pain, tinnitus and ear discharge.   Eyes: Negative for blurred vision, double vision, photophobia, pain, discharge and redness.  Respiratory: Negative for cough, hemoptysis, sputum production, shortness of breath, wheezing and stridor.   Cardiovascular: Negative for chest pain, palpitations, orthopnea, claudication and leg swelling.  Gastrointestinal: per HPI.  Genitourinary: Negative for dysuria, urgency, frequency, hematuria and flank pain.  Musculoskeletal: Negative for myalgias, back pain, joint pain and falls.  Skin: Negative for itching and rash.  Neurological: Negative for dizziness and weakness. Negative for tingling, tremors, sensory change, speech change, focal weakness, loss of consciousness and headaches.  Endo/Heme/Allergies: Negative for environmental allergies and polydipsia. Does not bruise/bleed easily.  Psychiatric/Behavioral: Negative for suicidal ideas. The patient is not nervous/anxious.      Past Medical History  Diagnosis Date  . Hypertension   . Diabetes mellitus   . Hyperlipidemia   . Fatigue   . Night sweats   . Wears dentures   . Poor appetite   . Retinopathy   . Arthritis   . Rash   . Bruises easily   . Colon polyp   . Hernia   . Rectal bleeding   . Hemorrhoid   . Family history of breast cancer   . Abdominal pain     spasms radiating around to the right side of abdomen   . Shortness of breath   .  Night sweats   . Hepatic encephalopathy   . Non-alcoholic fatty liver disease   . Liver cirrhosis     nonalcoholic  . GERD  (gastroesophageal reflux disease)   . Depression   . Anxiety   . COPD (chronic obstructive pulmonary disease)     pt denies this  . Anemia   . IBS (irritable bowel syndrome)   . RLS (restless legs syndrome)   . Macular degeneration   . Memory loss     hepatatic encephlopathy  . Degenerative arthritis   . Diabetes 1.5, managed as type 1   . Neuropathy, peripheral     feet legs   Past Surgical History  Procedure Laterality Date  . Cholecystectomy  11/28/10  . Liver biopsy  Sep 2012  . Hernia repair      belly button  . Esophagogastroduodenoscopy  07/16/2011    Procedure: ESOPHAGOGASTRODUODENOSCOPY (EGD);  Surgeon: Michelle Modena, Galvan;  Location: [facility];  Service: Endoscopy;  Laterality: N/A;  . Cesarean section     Social History:  reports that she quit smoking about 8 years ago. She has never used smokeless tobacco. She reports that she does not drink alcohol or use illicit drugs.  Allergies  Allergen Reactions  . Latex Rash  . Actos [Pioglitazone] Swelling and Other (See Comments)    edema Edema   . Byetta 10 Mcg Pen [Exenatide] Other (See Comments)    Stomach upset  . Crestor [Rosuvastatin] Other (See Comments)    Other reaction(s): Liver Disorder Elevated LFT's Increases LFT  . Lipitor [Atorvastatin] Other (See Comments)    Other reaction(s): Muscle Pain Muscle aches  . Other Other (See Comments)    Stomach upset  . Penicillin G Hives  . Penicillins Hives and Itching    All over the body.    Family History:  Family History  Problem Relation Age of Onset  . Cancer Mother   . Hypertension Mother   . Hyperlipidemia Father   . Iron deficiency Sister   . Heart disease Brother   . Hyperlipidemia Brother      Prior to Admission medications   Medication Sig Start Date End Date Taking? Authorizing Provider  aspirin EC 81 MG tablet Take 81 mg by mouth every morning.   Yes Historical Provider, Galvan  cholecalciferol (VITAMIN D) 400 UNITS TABS tablet Take 400  Units by mouth every morning.   Yes Historical Provider, Galvan  clobetasol (OLUX) 0.05 % topical foam Apply 1 application topically 2 (two) times daily.  02/15/13  Yes Historical Provider, Galvan  clobetasol cream (TEMOVATE) 0.05 % Apply 1 application topically 2 (two) times daily.   Yes Historical Provider, Galvan  colesevelam (WELCHOL) 625 MG tablet Take 1,875 mg by mouth 2 (two) times daily. Three pills taken per dosage. Total of six per day.   Yes Historical Provider, Galvan  diphenhydrAMINE (BENADRYL) 25 MG tablet Take 25 mg by mouth every 6 (six) hours as needed for itching (itching).   Yes Historical Provider, Galvan  DULoxetine (CYMBALTA) 60 MG capsule Take 60 mg by mouth every morning.    Yes Historical Provider, Galvan  Empagliflozin (JARDIANCE) 10 MG TABS Take 10 mg by mouth daily.   Yes Historical Provider, Galvan  esomeprazole (NEXIUM) 40 MG capsule Take 40 mg by mouth daily before breakfast.    Yes Historical Provider, Galvan  ezetimibe (ZETIA) 10 MG tablet Take 10 mg by mouth at bedtime.    Yes Historical Provider, Galvan  ferrous sulfate 325 (65 FE) MG  tablet Take 325 mg by mouth 2 (two) times daily.   Yes Historical Provider, Galvan  fluocinonide cream (LIDEX) 0.05 % Apply 1 application topically 2 (two) times daily.   Yes Historical Provider, Galvan  gabapentin (NEURONTIN) 300 MG capsule Take 300 mg by mouth at bedtime.    Yes Historical Provider, Galvan  insulin detemir (LEVEMIR) 100 UNIT/ML injection Inject 80 Units into the skin every morning.    Yes Historical Provider, Galvan  insulin lispro (HUMALOG) 100 UNIT/ML injection Inject 14 Units into the skin daily with supper.    Yes Historical Provider, Galvan  lactulose (CHRONULAC) 10 GM/15ML solution Take 20 g by mouth 3 (three) times daily as needed for mild constipation (constipation).    Yes Historical Provider, Galvan  meloxicam (MOBIC) 7.5 MG tablet Take 7.5 mg by mouth every morning.  02/19/13  Yes Historical Provider, Galvan  omeprazole (PRILOSEC) 20 MG capsule Take 20 mg by mouth daily.    Yes Historical Provider, Galvan  propranolol (INDERAL) 10 MG tablet Take 10 mg by mouth 2 (two) times daily.  02/20/13  Yes Historical Provider, Galvan  rifaximin (XIFAXAN) 550 MG TABS tablet Take 550 mg by mouth 2 (two) times daily.   Yes Historical Provider, Galvan  spironolactone (ALDACTONE) 50 MG tablet Take 50 mg by mouth every morning.   Yes Historical Provider, Galvan  zinc gluconate 50 MG tablet Take 50 mg by mouth daily.   Yes Historical Provider, Galvan   Physical Exam: Filed Vitals:   12/15/13 1410 12/15/13 1420 12/15/13 1430 12/15/13 1531  BP:   114/62 139/50  Pulse:   77 69  Temp:    98.4 F (36.9 C)  TempSrc:    Oral  Resp:    20  Weight:    93.4 kg (205 lb 14.6 oz)  SpO2: 92% 94%  93%    Physical Exam  Constitutional: Appears well-developed and well-nourished. No distress.  HENT: Normocephalic. No tonsillar erythema or exudates Eyes: Conjunctivae and EOM are normal. PERRLA, no scleral icterus.  Neck: Normal ROM. Neck supple. No JVD. No tracheal deviation. No thyromegaly.  CVS: RRR, S1/S2 +, no murmurs, no gallops, no carotid bruit.  Pulmonary: Effort and breath sounds normal, no stridor, rhonchi, wheezes, rales.  Abdominal: Soft. BS +,  distended, tenderness in mid abdomen, no rebound or guarding.  Musculoskeletal: Normal range of motion. No edema and no tenderness.  Lymphadenopathy: No lymphadenopathy noted, cervical, inguinal. Neuro: Alert. Normal reflexes, muscle tone coordination. No focal neurologic deficits. Skin: Skin is warm and dry. No rash noted. Not diaphoretic. No erythema. No pallor.  Psychiatric: Normal mood and affect. Behavior, judgment, thought content normal.   Labs on Admission:  Basic Metabolic Panel:  Recent Labs Lab 12/15/13 1039  NA 139  K 4.3  CL 105  CO2 25  GLUCOSE 129*  BUN 9  CREATININE 0.44*  CALCIUM 8.9   Liver Function Tests:  Recent Labs Lab 12/15/13 1039  AST 67*  ALT 37*  ALKPHOS 114  BILITOT 4.4*  PROT 8.1  ALBUMIN 2.4*     Recent Labs Lab 12/15/13 1039  LIPASE 47    Recent Labs Lab 12/15/13 1152  AMMONIA 30   CBC:  Recent Labs Lab 12/15/13 1039  WBC 9.1  NEUTROABS 5.5  HGB 14.5  HCT 41.9  MCV 101.5*  PLT 127*   Cardiac Enzymes: No results found for this basename: CKTOTAL, CKMB, CKMBINDEX, TROPONINI,  in the last 168 hours BNP: No components found with this basename: POCBNP,  CBG:  Recent Labs Lab 12/15/13 1239  GLUCAP 95    If 7PM-7AM, please contact night-coverage [email or web address] Password [email or web address] 12/15/2013, 5:19 PM

## 2013-12-16 ENCOUNTER — Inpatient Hospital Stay (HOSPITAL_COMMUNITY): Payer: Managed Care, Other (non HMO)

## 2013-12-16 DIAGNOSIS — K7460 Unspecified cirrhosis of liver: Secondary | ICD-10-CM

## 2013-12-16 DIAGNOSIS — K7581 Nonalcoholic steatohepatitis (NASH): Principal | ICD-10-CM

## 2013-12-16 LAB — COMPREHENSIVE METABOLIC PANEL
ALBUMIN: 2.2 g/dL — AB (ref 3.5–5.2)
ALT: 31 U/L (ref 0–35)
AST: 57 U/L — AB (ref 0–37)
Alkaline Phosphatase: 104 U/L (ref 39–117)
Anion gap: 7 (ref 5–15)
BUN: 12 mg/dL (ref 6–23)
CALCIUM: 8.5 mg/dL (ref 8.4–10.5)
CO2: 26 mEq/L (ref 19–32)
CREATININE: 0.61 mg/dL (ref 0.50–1.10)
Chloride: 103 mEq/L (ref 96–112)
GFR calc Af Amer: >90 mL/min (ref >90)
GFR calc non Af Amer: >90 mL/min (ref >90)
Glucose, Bld: 140 mg/dL — ABNORMAL HIGH (ref 70–99)
Potassium: 4.6 mEq/L (ref 3.7–5.3)
Sodium: 136 mEq/L — ABNORMAL LOW (ref 137–147)
Total Bilirubin: 4.2 mg/dL — ABNORMAL HIGH (ref 0.3–1.2)
Total Protein: 7.4 g/dL (ref 6.0–8.3)

## 2013-12-16 LAB — CBC
HEMATOCRIT: 40.3 % (ref 36.0–46.0)
Hemoglobin: 13.9 g/dL (ref 12.0–15.0)
MCH: 34.8 pg — ABNORMAL HIGH (ref 26.0–34.0)
MCHC: 34.5 g/dL (ref 30.0–36.0)
MCV: 100.8 fL — AB (ref 78.0–100.0)
Platelets: 121 10*3/uL — ABNORMAL LOW (ref 150–400)
RBC: 4 MIL/uL (ref 3.87–5.11)
RDW: 15 % (ref 11.5–15.5)
WBC: 8.5 10*3/uL (ref 4.0–10.5)

## 2013-12-16 LAB — GLUCOSE, CAPILLARY
GLUCOSE-CAPILLARY: 119 mg/dL — AB (ref 70–99)
Glucose-Capillary: 109 mg/dL — ABNORMAL HIGH (ref 70–99)
Glucose-Capillary: 95 mg/dL (ref 70–99)
Glucose-Capillary: 105 mg/dL — ABNORMAL HIGH (ref 70–99)
Glucose-Capillary: 73 mg/dL (ref 70–99)
Glucose-Capillary: 106 mg/dL — ABNORMAL HIGH (ref 70–99)

## 2013-12-16 LAB — HEMOGLOBIN A1C
Hgb A1c MFr Bld: 6.9 % — ABNORMAL HIGH (ref <5.7)
MEAN PLASMA GLUCOSE: 151 mg/dL — AB (ref <117)

## 2013-12-16 MED ORDER — BISACODYL 10 MG RE SUPP
10.0000 mg | Freq: Once | RECTAL | Status: AC
  Administered 2013-12-16: 10 mg via RECTAL
  Filled 2013-12-16: qty 1

## 2013-12-16 MED ORDER — HYDROMORPHONE HCL 1 MG/ML IJ SOLN
0.2500 mg | Freq: Once | INTRAMUSCULAR | Status: AC
  Administered 2013-12-16: 0.25 mg via INTRAVENOUS
  Filled 2013-12-16: qty 1

## 2013-12-16 MED ORDER — LEVOFLOXACIN 750 MG PO TABS
750.0000 mg | ORAL_TABLET | Freq: Every day | ORAL | Status: DC
  Administered 2013-12-16 – 2013-12-19 (×4): 750 mg via ORAL
  Filled 2013-12-16 (×5): qty 1

## 2013-12-16 MED ORDER — ENSURE COMPLETE PO LIQD
237.0000 mL | Freq: Two times a day (BID) | ORAL | Status: DC
  Administered 2013-12-17 – 2013-12-19 (×5): 237 mL via ORAL

## 2013-12-16 MED ORDER — LACTULOSE 10 GM/15ML PO SOLN
20.0000 g | Freq: Two times a day (BID) | ORAL | Status: DC | PRN
  Filled 2013-12-16: qty 30

## 2013-12-16 NOTE — Procedures (Signed)
US guided therapeutic paracentesis performed yielding 1.9 liters yellow fluid. No immediate complications.  

## 2013-12-16 NOTE — Progress Notes (Signed)
[facility] PROGRESS NOTE  [name] ZOX:[id number] DOB: [birth year] DOA: 12/15/2013 PCP: Michelle Hillier, MD  Assessment/Plan: 1. Cirrhosis -Has history of NASH -She is on transplant list at [facility] -Undergoing repeat paracentesis today with removal of 1.9L of fluid.  -Continue Spironolactone -GI consulted  2. Abdominal Pain -Abdominal XR showing nonobstructive gas pattern.  -Seems improved after undergoing paracentesis although reports not having a bowel movement in the past 3 days which make constipation a possibility. Will provide lactulose and dulcolax suppository.  -She is afebrile, nontoxic, labs showing normal white count.   3. Chronic Anemia -Hg stable at 13.9  4.  Insulin Dependent Diabetes Mellitus -Blood sugars stable -Continue Insulin detemir with sliding scale coverage.   Code Status: Full Code Family Communication: Spoke with family present at bedside Disposition Plan: Continue supportive care, will likely go home upon discharge   Consultants:  GI  Procedures:  U/S guided paracentesis performed on 12/16/2013  HPI/Subjective: Patient with a past medical history of nonalcoholic steatohepatitis, undergoing recent paracentesis on 12/13/2013 where 2 L liters of ascitic fluid were removed. She presented with complaints of abdominal distention with associated pain and discomfort. She underwent u/s guided paracentesis on 12/16/2013 where 1.9 L of ascitic fluid were removed. Post procedure reported relief of her symptoms.    Objective: Filed Vitals:   12/16/13 1118  BP: 134/50  Pulse: 65  Temp:   Resp: 18   No intake or output data in the 24 hours ending 12/16/13 1517 Filed Weights   12/15/13 1531 12/16/13 0608  Weight: 93.4 kg (205 lb 14.6 oz) 94.5 kg (208 lb 5.4 oz)    Exam:   General:  Soft, nontender, nondistended  Cardiovascular: Regular rate and rhythm, normal S1S2  Respiratory: Normal inspiratory effort  Abdomen:  Distended, positive fluid wave, nontender  Musculoskeletal: No extremity edema  Data Reviewed: Basic Metabolic Panel:  Recent Labs Lab 12/15/13 1039 12/15/13 1827 12/16/13 0420  NA 139 134* 136*  K 4.3 4.4 4.6  CL 105 101 103  CO2 25 26 26   GLUCOSE 129* 82 140*  BUN 9 11 12   CREATININE 0.44* 0.54 0.61  CALCIUM 8.9 8.8 8.5  MG  --  2.3  --   PHOS  --  3.5  --    Liver Function Tests:  Recent Labs Lab 12/15/13 1039 12/15/13 1827 12/16/13 0420  AST 67* 60* 57*  ALT 37* 31 31  ALKPHOS 114 105 104  BILITOT 4.4* 4.5* 4.2*  PROT 8.1 7.7 7.4  ALBUMIN 2.4* 2.4* 2.2*    Recent Labs Lab 12/15/13 1039  LIPASE 47    Recent Labs Lab 12/15/13 1152  AMMONIA 30   CBC:  Recent Labs Lab 12/15/13 1039 12/15/13 1827 12/16/13 0420  WBC 9.1 8.7 8.5  NEUTROABS 5.5 5.0  --   HGB 14.5 14.3 13.9  HCT 41.9 41.5 40.3  MCV 101.5* 101.2* 100.8*  PLT 127* 126* 121*   Cardiac Enzymes: No results found for this basename: CKTOTAL, CKMB, CKMBINDEX, TROPONINI,  in the last 168 hours BNP (last 3 results) No results found for this basename: PROBNP,  in the last 8760 hours CBG:  Recent Labs Lab 12/15/13 1737 12/15/13 2113 12/16/13 0748 12/16/13 1145 12/16/13 1231  GLUCAP 98 157* 109* 95 105*    No results found for this or any previous visit (from the past 240 hour(s)).   Studies: Dg Abd 2 Views  12/16/2013   CLINICAL DATA:  Abdominal distention, pain, nausea, vomiting and constipation. Initial  encounter appear  EXAM: ABDOMEN - 2 VIEW  COMPARISON:  Ultrasound-guided paracentesis -12/16/2013  FINDINGS: Nonobstructive bowel gas pattern. No pneumoperitoneum, pneumatosis or portal venous gas.  Limited visualization of lower thorax demonstrates small to moderate size left-sided effusion with associated left basilar heterogeneous/ consolidative opacities.  Post cholecystectomy. No definite abnormal intra-abdominal calcifications.  No acute osseus abnormalities.  IMPRESSION: 1.  Nonobstructive bowel-gas pattern 2. Note is made of a small to moderate-sized left-sided pleural effusion with associated left basilar heterogeneous/consolidative opacities - atelectasis versus infiltrate. Further evaluation with PA and lateral chest radiograph could be performed as clinically indicated. These results will be called to the ordering clinician or representative by the Radiologist Assistant, and communication documented in the PACS or zVision Dashboard.   Electronically Signed   By: Simonne ComeJohn  Watts M.D.   On: 12/16/2013 14:00    Scheduled Meds: . aspirin EC  81 mg Oral q morning - 10a  . cholecalciferol  400 Units Oral q morning - 10a  . clobetasol cream  1 application Topical BID  . colesevelam  1,875 mg Oral BID  . DULoxetine  60 mg Oral q morning - 10a  . Empagliflozin  10 mg Oral Daily  . ezetimibe  10 mg Oral QHS  . feeding supplement (ENSURE COMPLETE)  237 mL Oral BID BM  . ferrous sulfate  325 mg Oral BID WC  . fluocinonide cream   Topical BID  . gabapentin  300 mg Oral QHS  . insulin aspart  0-15 Units Subcutaneous TID WC  . insulin aspart  0-5 Units Subcutaneous QHS  . insulin aspart  14 Units Subcutaneous Q supper  . insulin detemir  80 Units Subcutaneous q morning - 10a  . meloxicam  7.5 mg Oral q morning - 10a  . pantoprazole  40 mg Oral Daily  . propranolol  10 mg Oral BID  . rifaximin  550 mg Oral BID  . spironolactone  50 mg Oral q morning - 10a   Continuous Infusions: . sodium chloride 10 mL/hr at 12/15/13 1811    Principal Problem:   Abdominal distension Active Problems:   NASH (nonalcoholic steatohepatitis)   Ascites   Dyslipidemia   Diabetes   Anemia of chronic disease   Depression    Time spent: 35 min    [name], [name]  [facility] Pager [phone]. If 7PM-7AM, please contact night-coverage at [email or web address], password [email or web address] 12/16/2013, 3:17 PM  LOS: 1 day

## 2013-12-16 NOTE — Progress Notes (Signed)
INITIAL NUTRITION ASSESSMENT  DOCUMENTATION CODES Per approved criteria  -Obesity Unspecified   INTERVENTION: - Ensure Complete BID - Assisted pt with ordering meals - RD to continue to monitor   NUTRITION DIAGNOSIS: Altered GI function related to nausea, vomiting, diarrhea as evidenced by pt report.   Goal: 1. Resolution of nausea/vomiting/diarrhea 2. Pt to consume >90% of meals/supplements  Monitor:  Weights, labs, intake, nausea/vomiting/diarrhea  Reason for Assessment: Malnutrition screening tool   58 y.o. female  Admitting Dx: Abdominal distension  ASSESSMENT: Pt with past medical history of NASH, frequent paracenteses (last 12/13/13 with 2 L fluid drained), diabetes, anemia, dyslipidemia who presented to [facility] ED 12/15/2013 with abdominal pain ever since having paracentesis 12/13/13. Patient reported her swelling is still about the same and she has mid abdominal pain, 8/10 in intensity. Patient also reported non bloody diarrhea, vomiting occasionally but not consistently.   - Pt discussed during multidisciplinary rounds - Met with pt and daughter who report pt has been not eating well x 1 month due to having diarrhea or vomiting after eating and chronic nausea - Daily intake consists of salads with either chicken, ham, or pork - Pt reports stable weight  - Daughter reports pt has been drinking a protein shake similar to Boost twice/day - Nutrition focused physical exam performed which was WNL  AST and total bilirubin elevated    Height: Ht Readings from Last 1 Encounters:  12/16/13 $RemoveB'5\' 5"'szzJoscS$  (1.651 m)    Weight: Wt Readings from Last 1 Encounters:  12/16/13 208 lb 5.4 oz (94.5 kg)    Ideal Body Weight: 125 lbs  % Ideal Body Weight: 166%  Wt Readings from Last 10 Encounters:  12/16/13 208 lb 5.4 oz (94.5 kg)  11/16/13 197 lb (89.359 kg)  04/21/13 214 lb (97.07 kg)  03/22/13 212 lb (96.163 kg)  09/14/12 198 lb (89.812 kg)  04/27/12 200 lb (90.719 kg)  07/16/11  198 lb (89.812 kg)  07/16/11 198 lb (89.812 kg)  12/16/10 198 lb 2 oz (89.869 kg)  11/06/10 203 lb (92.08 kg)    Usual Body Weight: 200 lbs per pt  % Usual Body Weight: 104%  BMI:  Body mass index is 34.67 kg/(m^2). Class I obesity   Estimated Nutritional Needs: Kcal: 1500-1700 Protein: 70-90g Fluid: 1.5-1.7L/day   Skin: +1 generalized edema   Diet Order: Carb Control  EDUCATION NEEDS: -No education needs identified at this time  No intake or output data in the 24 hours ending 12/16/13 1249  Last BM: 10/1   Labs:   Recent Labs Lab 12/15/13 1039 12/15/13 1827 12/16/13 0420  NA 139 134* 136*  K 4.3 4.4 4.6  CL 105 101 103  CO2 $Re'25 26 26  'tIY$ BUN $R'9 11 12  'rB$ CREATININE 0.44* 0.54 0.61  CALCIUM 8.9 8.8 8.5  MG  --  2.3  --   PHOS  --  3.5  --   GLUCOSE 129* 82 140*    CBG (last 3)   Recent Labs  12/16/13 0748 12/16/13 1145 12/16/13 1231  GLUCAP 109* 95 105*    Scheduled Meds: . aspirin EC  81 mg Oral q morning - 10a  . cholecalciferol  400 Units Oral q morning - 10a  . clobetasol cream  1 application Topical BID  . colesevelam  1,875 mg Oral BID  . DULoxetine  60 mg Oral q morning - 10a  . Empagliflozin  10 mg Oral Daily  . ezetimibe  10 mg Oral QHS  . ferrous sulfate  325 mg Oral BID WC  . fluocinonide cream   Topical BID  . gabapentin  300 mg Oral QHS  . insulin aspart  0-15 Units Subcutaneous TID WC  . insulin aspart  0-5 Units Subcutaneous QHS  . insulin aspart  14 Units Subcutaneous Q supper  . insulin detemir  80 Units Subcutaneous q morning - 10a  . meloxicam  7.5 mg Oral q morning - 10a  . pantoprazole  40 mg Oral Daily  . propranolol  10 mg Oral BID  . rifaximin  550 mg Oral BID  . spironolactone  50 mg Oral q morning - 10a    Continuous Infusions: . sodium chloride 10 mL/hr at 12/15/13 1811    Past Medical History  Diagnosis Date  . Hypertension   . Diabetes mellitus   . Hyperlipidemia   . Fatigue   . Night sweats   . Wears  dentures   . Poor appetite   . Retinopathy   . Arthritis   . Rash   . Bruises easily   . Colon polyp   . Hernia   . Rectal bleeding   . Hemorrhoid   . Family history of breast cancer   . Abdominal pain     spasms radiating around to the right side of abdomen   . Shortness of breath   . Night sweats   . Hepatic encephalopathy   . Non-alcoholic fatty liver disease   . Liver cirrhosis     nonalcoholic  . GERD (gastroesophageal reflux disease)   . Depression   . Anxiety   . COPD (chronic obstructive pulmonary disease)     pt denies this  . Anemia   . IBS (irritable bowel syndrome)   . RLS (restless legs syndrome)   . Macular degeneration   . Memory loss     hepatatic encephlopathy  . Degenerative arthritis   . Diabetes 1.5, managed as type 1   . Neuropathy, peripheral     feet legs    Past Surgical History  Procedure Laterality Date  . Cholecystectomy  11/28/10  . Liver biopsy  Sep 2012  . Hernia repair      belly button  . Esophagogastroduodenoscopy  07/16/2011    Procedure: ESOPHAGOGASTRODUODENOSCOPY (EGD);  Surgeon: Arta Silence, MD;  Location: [facility];  Service: Endoscopy;  Laterality: N/A;  . Cesarean section      Carlis Stable MS, RD, LDN [phone] Pager [phone] Weekend/After Hours Pager

## 2013-12-16 NOTE — Progress Notes (Signed)
Patient ID: Michelle FennelVicky S Galvan, female   DOB: 17-May-1955, 58 y.o.   MRN: [id number]  Abd Xray reviewed earlier today and CXR ordered and reviewed. Question infiltrate vs atelectasis in lower left lung. Will ask hospitalist to review and decide whether antibiotics needed.

## 2013-12-16 NOTE — Progress Notes (Signed)
S:  Patient well-known to me.  She has history of cirrhosis (felt likely to be NAFLD-related), which is decompensated (based on hepatic encephalopathy and refractory ascites).  She presents for worsening abdominal distention and nausea and vomiting.  Has had a couple small-volume paracenteses over the past week, with total of about 4L of fluid removed.  Patient has had some mild improvement of symptoms, transiently, after each paracentesis, with rather rapid recurrence of symptoms.  She tells me she has a history of bowel obstruction a few years ago, and also has history of significant constipation.  O: GEN:  Swollen facies, alert without encephalopathy SKIN/HEENT:  Jaundiced, scattered telangiectasias and ecchymoses; sclera icteric ABD:  Distended, mild diffuse tenderness, tympany > dullness on percussion  Labs: CMP     Component Value Date/Time   NA 136* 12/16/2013 0420   K 4.6 12/16/2013 0420   CL 103 12/16/2013 0420   CO2 26 12/16/2013 0420   GLUCOSE 140* 12/16/2013 0420   BUN 12 12/16/2013 0420   CREATININE 0.61 12/16/2013 0420   CALCIUM 8.5 12/16/2013 0420   PROT 7.4 12/16/2013 0420   ALBUMIN 2.2* 12/16/2013 0420   AST 57* 12/16/2013 0420   ALT 31 12/16/2013 0420   ALKPHOS 104 12/16/2013 0420   BILITOT 4.2* 12/16/2013 0420   GFRNONAA >90 12/16/2013 0420   GFRAA >90 12/16/2013 0420   CBC    Component Value Date/Time   WBC 8.5 12/16/2013 0420   RBC 4.00 12/16/2013 0420   HGB 13.9 12/16/2013 0420   HCT 40.3 12/16/2013 0420   PLT 121* 12/16/2013 0420   MCV 100.8* 12/16/2013 0420   MCH 34.8* 12/16/2013 0420   MCHC 34.5 12/16/2013 0420   RDW 15.0 12/16/2013 0420   LYMPHSABS 2.5 12/15/2013 1827   MONOABS 1.1* 12/15/2013 1827   EOSABS 0.1 12/15/2013 1827   BASOSABS 0.0 12/15/2013 1827   INR 2.1  Assessment:  1.  Abdominal distention, nausea, vomiting.  Transient improvement with paracentesis.  I doubt how symptoms could so quickly recur if ascites were the main issue.  Wonder if she has partial  small bowel obstruction or component of constipation. 2.  Cirrhosis, decompensated, most recent MELD 20, [facility] at [facility], per daughter).  Plan:  1.  Judicious analgesics. 2.  Abdominal xray. 3.  If no evidence of bowel obstruction, consider dulcolax suppository and slow miralax administration. 4.  Sips clears for now, will titrate pending xray results and pending her clinical course. 5.  [facility] will follow.

## 2013-12-16 NOTE — Care Management Note (Signed)
    Page 1 of 1   12/16/2013     2:21:10 PM CARE MANAGEMENT NOTE 12/16/2013  Patient:  Derenda FennelBURGESS,Rodnisha S   Account Number:  [id number]  Date Initiated:  12/16/2013  Documentation initiated by:  [facility] Campus[facility]HABIR,Somnang Mahan  Subjective/Objective Assessment:   58 Y/O F ADMITTED W/ABD DISTENSION.     Action/Plan:   FROM HOME.   Anticipated DC Date:  12/19/2013   Anticipated DC Plan:  HOME/SELF CARE      DC Planning Services  CM consult      Choice offered to / List presented to:             Status of service:  In process, will continue to follow Medicare Important Message given?   (If response is "NO", the following Medicare IM given date fields will be blank) Date Medicare IM given:   Medicare IM given by:   Date Additional Medicare IM given:   Additional Medicare IM given by:    Discharge Disposition:    Per UR Regulation:  Reviewed for med. necessity/level of care/duration of stay  If discussed at Long Length of Stay Meetings, dates discussed:    Comments:  12/16/13 Daziah Hesler RN,BSN NCM [phone] S/P PARACENTESIS.MONITOR FOR PROGRESS & D/C NEEDS.

## 2013-12-17 ENCOUNTER — Inpatient Hospital Stay (HOSPITAL_COMMUNITY): Payer: Managed Care, Other (non HMO)

## 2013-12-17 DIAGNOSIS — J90 Pleural effusion, not elsewhere classified: Secondary | ICD-10-CM

## 2013-12-17 LAB — GLUCOSE, CAPILLARY
GLUCOSE-CAPILLARY: 77 mg/dL (ref 70–99)
GLUCOSE-CAPILLARY: 104 mg/dL — AB (ref 70–99)
Glucose-Capillary: 45 mg/dL — ABNORMAL LOW (ref 70–99)
Glucose-Capillary: 125 mg/dL — ABNORMAL HIGH (ref 70–99)
Glucose-Capillary: 79 mg/dL (ref 70–99)
Glucose-Capillary: 154 mg/dL — ABNORMAL HIGH (ref 70–99)
Glucose-Capillary: 92 mg/dL (ref 70–99)

## 2013-12-17 MED ORDER — POLYETHYLENE GLYCOL 3350 17 G PO PACK
17.0000 g | PACK | Freq: Two times a day (BID) | ORAL | Status: DC
  Filled 2013-12-17: qty 1

## 2013-12-17 MED ORDER — LACTULOSE 10 GM/15ML PO SOLN
20.0000 g | Freq: Two times a day (BID) | ORAL | Status: DC
  Administered 2013-12-17: 20 g via ORAL
  Filled 2013-12-17 (×2): qty 30

## 2013-12-17 MED ORDER — LACTULOSE 10 GM/15ML PO SOLN
30.0000 g | Freq: Three times a day (TID) | ORAL | Status: DC
  Administered 2013-12-17 – 2013-12-19 (×5): 30 g via ORAL
  Filled 2013-12-17 (×7): qty 45

## 2013-12-17 NOTE — Progress Notes (Signed)
Hypoglycemic Event  CBG: 45  Treatment: OJ ice pop  Symptoms:  Dry mouth  Follow-up CBG: Time:0240 CBG Result:77  Possible Reasons for Event: diet change  Comments/MD notified:    Evorn GongJackson, Vianey Caniglia Mae  Remember to initiate Hypoglycemia Order Set & complete

## 2013-12-17 NOTE — Progress Notes (Addendum)
[facility] PROGRESS NOTE  [name] ZOX:[id number] DOB: June 30, 1955 DOA: 12/15/2013 PCP: [name], MD  Assessment/Plan: 1. Cirrhosis -Has history of NASH -She is on transplant list at [facility] -Undergoing repeat paracentesis on 12/17/2013 with removal of 1.9L of fluid.  -Continue Spironolactone -GI consulted  2. Abdominal Pain -Abdominal XR showing nonobstructive gas pattern.  -Seems improved after undergoing paracentesis although reports not having a bowel movement in the past 3 days which make constipation a possibility. -Still has not had a bowel movement, will schedule lactulose BID.  -She is afebrile, nontoxic, labs showing normal white count.   3. Chronic Anemia -Hg stable at 13.9  4.  Insulin Dependent Diabetes Mellitus -Blood sugars stable -Continue Insulin detemir with sliding scale coverage.   5. Pleural Effusions -Chest x-ray showing increasing bundle pleural fluid on the left, radiology reporting that left base remains dense and may vascular pneumonia, or could also be related to atelectasis -Started empiric antibiotic coverage with Levaquin 750 mg by mouth daily, she remains stable, afebrile, in no acute respiratory distress.  -Given enlarging effusions may need thoracentesis  Code Status: Full Code Family Communication: Spoke with family present at bedside Disposition Plan: Continue supportive care, will likely go home upon discharge   Consultants:  GI  Procedures:  U/S guided paracentesis performed on 12/16/2013  HPI/Subjective: Patient with a past medical history of nonalcoholic steatohepatitis, undergoing recent paracentesis on 12/13/2013 where 2 L liters of ascitic fluid were removed. She presented with complaints of abdominal distention with associated pain and discomfort. She underwent u/s guided paracentesis on 12/16/2013 where 1.9 L of ascitic fluid were removed. Post procedure reported relief of her symptoms.  Patient reports  feeling better, improved abdominal pain  Objective: Filed Vitals:   12/17/13 0402  BP: 111/57  Pulse: 63  Temp: 97.8 F (36.6 C)  Resp: 20    Intake/Output Summary (Last 24 hours) at 12/17/13 1301 Last data filed at 12/17/13 0848  Gross per 24 hour  Intake    600 ml  Output      0 ml  Net    600 ml   Filed Weights   12/15/13 1531 12/16/13 0608 12/17/13 0402  Weight: 93.4 kg (205 lb 14.6 oz) 94.5 kg (208 lb 5.4 oz) 91.672 kg (202 lb 1.6 oz)    Exam:   General:  Soft, nontender, nondistended  Cardiovascular: Regular rate and rhythm, normal S1S2  Respiratory: Normal inspiratory effort  Abdomen: Abdomen soft, nontender  Musculoskeletal: No extremity edema  Data Reviewed: Basic Metabolic Panel:  Recent Labs Lab 12/15/13 1039 12/15/13 1827 12/16/13 0420  NA 139 134* 136*  K 4.3 4.4 4.6  CL 105 101 103  CO2 25 26 26   GLUCOSE 129* 82 140*  BUN 9 11 12   CREATININE 0.44* 0.54 0.61  CALCIUM 8.9 8.8 8.5  MG  --  2.3  --   PHOS  --  3.5  --    Liver Function Tests:  Recent Labs Lab 12/15/13 1039 12/15/13 1827 12/16/13 0420  AST 67* 60* 57*  ALT 37* 31 31  ALKPHOS 114 105 104  BILITOT 4.4* 4.5* 4.2*  PROT 8.1 7.7 7.4  ALBUMIN 2.4* 2.4* 2.2*    Recent Labs Lab 12/15/13 1039  LIPASE 47    Recent Labs Lab 12/15/13 1152  AMMONIA 30   CBC:  Recent Labs Lab 12/15/13 1039 12/15/13 1827 12/16/13 0420  WBC 9.1 8.7 8.5  NEUTROABS 5.5 5.0  --   HGB 14.5 14.3 13.9  HCT 41.9 41.5 40.3  MCV 101.5* 101.2* 100.8*  PLT 127* 126* 121*   Cardiac Enzymes: No results found for this basename: CKTOTAL, CKMB, CKMBINDEX, TROPONINI,  in the last 168 hours BNP (last 3 results) No results found for this basename: PROBNP,  in the last 8760 hours CBG:  Recent Labs Lab 12/17/13 0224 12/17/13 0251 12/17/13 0408 12/17/13 0800 12/17/13 1208  GLUCAP 45* 77 125* 79 154*    No results found for this or any previous visit (from the past 240 hour(s)).    Studies: Dg Chest 2 View  12/17/2013   CLINICAL DATA:  Pneumonia and chest tightness and shortness of breath ; history of COPD, diabetes, and hepatic encephalopathy  EXAM: CHEST  2 VIEW  COMPARISON:  PA and lateral chest x-ray of December 16, 2013  FINDINGS: There is increased volume loss on the left consistent with accumulation of additional pleural fluid. The aerated portion of the left lung exhibits mildly increased density. The right lung is clear. The cardiac silhouette where visualized is normal. The pulmonary vascularity is not engorged. The trachea is midline. The bony thorax exhibits no acute abnormality.  IMPRESSION: Increasing volume of pleural fluid on the left. The left base remains dense and may obscure pneumonia or other pathology.   Electronically Signed   By: David  [place]   On: 12/17/2013 10:58   Dg Chest 2 View  12/16/2013   CLINICAL DATA:  Shortness of breath. History of hepatic encephalopathy, hypertension, diabetes and COPD  EXAM: CHEST  2 VIEW  COMPARISON:  12/06/2010; 09/07/2008; chest CT - 12/06/2010  FINDINGS: Interval development of a small layering left-sided effusion with associated left mid and lower lung heterogeneous/ consolidative opacities. There is partial obscuration of the left heart border. Otherwise, grossly unchanged enlarged cardiac silhouette and mediastinal contours. Atherosclerotic plaque within the thoracic aorta. Mild pulmonary venous congestion without frank evidence of edema. Grossly unchanged right basilar opacities favored to represent atelectasis. Unchanged bones. Post cholecystectomy.  IMPRESSION: 1. Interval development of a small layering left-sided effusion with associated left mid and lower lung opacities, atelectasis versus infiltrate. A follow-up chest radiograph in 4 to 6 weeks after treatment is recommended to ensure resolution. 2. Pulmonary venous congestion without frank evidence of edema.   Electronically Signed   By: Simonne Come M.D.   On:  12/16/2013 16:44   US Paracentesis  12/16/2013   CLINICAL DATA:  Cirrhosis, recurrent ascites. Request is made for therapeutic paracentesis.  EXAM: ULTRASOUND GUIDED THERAPEUTIC PARACENTESIS  COMPARISON:  PRIOR PARACENTESIS ON 12/13/2013  PROCEDURE: An ultrasound guided paracentesis was thoroughly discussed with the patient and questions answered. The benefits, risks, alternatives and complications were also discussed. The patient understands and wishes to proceed with the procedure. Written consent was obtained.  Ultrasound was performed to localize and mark an adequate pocket of fluid in the right mid to lower quadrant of the abdomen. The area was then prepped and draped in the normal sterile fashion. 1% Lidocaine was used for local anesthesia. Under ultrasound guidance a 19 gauge Yueh catheter was introduced. Paracentesis was performed. The catheter was removed and a dressing applied.  Complications: None.  FINDINGS: A total of approximately 1.9 liters of yellow fluid was removed.  IMPRESSION: Successful ultrasound guided therapeutic paracentesis yielding 1.9 liters of ascites.  Read by: Jeananne Rama, PA-C   Electronically Signed   By: Oley Balm M.D.   On: 12/16/2013 13:50   Dg Abd 2 Views  12/16/2013   CLINICAL DATA:  Abdominal  distention, pain, nausea, vomiting and constipation. Initial encounter appear  EXAM: ABDOMEN - 2 VIEW  COMPARISON:  Ultrasound-guided paracentesis -12/16/2013  FINDINGS: Nonobstructive bowel gas pattern. No pneumoperitoneum, pneumatosis or portal venous gas.  Limited visualization of lower thorax demonstrates small to moderate size left-sided effusion with associated left basilar heterogeneous/ consolidative opacities.  Post cholecystectomy. No definite abnormal intra-abdominal calcifications.  No acute osseus abnormalities.  IMPRESSION: 1. Nonobstructive bowel-gas pattern 2. Note is made of a small to moderate-sized left-sided pleural effusion with associated left basilar  heterogeneous/consolidative opacities - atelectasis versus infiltrate. Further evaluation with PA and lateral chest radiograph could be performed as clinically indicated. These results will be called to the ordering clinician or representative by the Radiologist Assistant, and communication documented in the PACS or zVision Dashboard.   Electronically Signed   By: Simonne ComeJohn  Watts M.D.   On: 12/16/2013 14:00    Scheduled Meds: . aspirin EC  81 mg Oral q morning - 10a  . cholecalciferol  400 Units Oral q morning - 10a  . clobetasol cream  1 application Topical BID  . colesevelam  1,875 mg Oral BID  . DULoxetine  60 mg Oral q morning - 10a  . Empagliflozin  10 mg Oral Daily  . ezetimibe  10 mg Oral QHS  . feeding supplement (ENSURE COMPLETE)  237 mL Oral BID BM  . ferrous sulfate  325 mg Oral BID WC  . fluocinonide cream   Topical BID  . gabapentin  300 mg Oral QHS  . insulin aspart  0-15 Units Subcutaneous TID WC  . insulin aspart  0-5 Units Subcutaneous QHS  . insulin aspart  14 Units Subcutaneous Q supper  . insulin detemir  80 Units Subcutaneous q morning - 10a  . lactulose  20 g Oral BID  . levofloxacin  750 mg Oral Daily  . meloxicam  7.5 mg Oral q morning - 10a  . pantoprazole  40 mg Oral Daily  . propranolol  10 mg Oral BID  . rifaximin  550 mg Oral BID  . spironolactone  50 mg Oral q morning - 10a   Continuous Infusions: . sodium chloride 10 mL/hr at 12/15/13 1811    Principal Problem:   Abdominal distension Active Problems:   NASH (nonalcoholic steatohepatitis)   Ascites   Dyslipidemia   Diabetes   Anemia of chronic disease   Depression    Time spent: 25 min    Jeralyn BennettZAMORA, Tykwon Fera  [facility] Pager [phone]. If 7PM-7AM, please contact night-coverage at [email or web address], password [email or web address] 12/17/2013, 1:01 PM  LOS: 2 days

## 2013-12-17 NOTE — Progress Notes (Signed)

## 2013-12-17 NOTE — Progress Notes (Signed)
Patient ID: Michelle FennelVicky S Galvan, female   DOB: 1955/04/02, 58 y.o.   MRN: [id number] [facility]  Michelle FennelVicky S Galvan 58 y.o. 1955/04/02   Subjective: Sleeping comfortably. Husband at bedside. Able to arouse pt, denies abdominal pain, vomiting. Nauseous earlier that has resolved. No BMs. +Flatus. Hungry and wants to eat. Tolerating clears.  Objective: Vital signs in last 24 hours: Filed Vitals:   12/17/13 1500  BP: 116/53  Pulse: 67  Temp: 98.1 F (36.7 C)  Resp: 18    Physical Exam: Gen: no acute distress Abd: soft, nondistended, nontender, +BS  Lab Results:  Recent Labs  12/15/13 1039 12/15/13 1827 12/16/13 0420  NA 139 134* 136*  K 4.3 4.4 4.6  CL 105 101 103  CO2 25 26 26   GLUCOSE 129* 82 140*  BUN 9 11 12   CREATININE 0.44* 0.54 0.61  CALCIUM 8.9 8.8 8.5  MG  --  2.3  --   PHOS  --  3.5  --     Recent Labs  12/15/13 1827 12/16/13 0420  AST 60* 57*  ALT 31 31  ALKPHOS 105 104  BILITOT 4.5* 4.2*  PROT 7.7 7.4  ALBUMIN 2.4* 2.2*    Recent Labs  12/15/13 1039 12/15/13 1827 12/16/13 0420  WBC 9.1 8.7 8.5  NEUTROABS 5.5 5.0  --   HGB 14.5 14.3 13.9  HCT 41.9 41.5 40.3  MCV 101.5* 101.2* 100.8*  PLT 127* 126* 121*    Recent Labs  12/15/13 1151 12/15/13 1827  LABPROT 23.8* 23.7*  INR 2.10* 2.09*   1.9 L peritoneal fluid removed yesterday (2 L removed earlier in the week).   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 Not symptomatic. Currently on Spironolactone 50 mg/day. May need to increase dose and add Lasix if fluid buildup continues. Change to soft diet and advance to low sodium diet. Lactulose dose increased until BMs occur and titrate for 2-3 soft stools per day. Continue supportive care.   Salina Stanfield C. 12/17/2013, 4:39 PM

## 2013-12-18 LAB — CBC
HCT: 43.1 % (ref 36.0–46.0)
HEMOGLOBIN: 14.6 g/dL (ref 12.0–15.0)
MCH: 34.8 pg — AB (ref 26.0–34.0)
MCHC: 33.9 g/dL (ref 30.0–36.0)
MCV: 102.6 fL — ABNORMAL HIGH (ref 78.0–100.0)
Platelets: 123 10*3/uL — ABNORMAL LOW (ref 150–400)
RBC: 4.2 MIL/uL (ref 3.87–5.11)
RDW: 15.2 % (ref 11.5–15.5)
WBC: 6.9 10*3/uL (ref 4.0–10.5)

## 2013-12-18 LAB — COMPREHENSIVE METABOLIC PANEL
ALBUMIN: 2.2 g/dL — AB (ref 3.5–5.2)
ALT: 33 U/L (ref 0–35)
AST: 60 U/L — ABNORMAL HIGH (ref 0–37)
Alkaline Phosphatase: 108 U/L (ref 39–117)
Anion gap: 9 (ref 5–15)
BUN: 8 mg/dL (ref 6–23)
CO2: 26 mEq/L (ref 19–32)
Calcium: 8.4 mg/dL (ref 8.4–10.5)
Chloride: 102 mEq/L (ref 96–112)
Creatinine, Ser: 0.5 mg/dL (ref 0.50–1.10)
GFR calc Af Amer: >90 mL/min (ref >90)
GFR calc non Af Amer: >90 mL/min (ref >90)
Glucose, Bld: 121 mg/dL — ABNORMAL HIGH (ref 70–99)
Potassium: 4.4 mEq/L (ref 3.7–5.3)
SODIUM: 137 meq/L (ref 137–147)
TOTAL PROTEIN: 7.8 g/dL (ref 6.0–8.3)
Total Bilirubin: 3.3 mg/dL — ABNORMAL HIGH (ref 0.3–1.2)

## 2013-12-18 LAB — GLUCOSE, CAPILLARY
GLUCOSE-CAPILLARY: 112 mg/dL — AB (ref 70–99)
GLUCOSE-CAPILLARY: 197 mg/dL — AB (ref 70–99)
Glucose-Capillary: 146 mg/dL — ABNORMAL HIGH (ref 70–99)
Glucose-Capillary: 94 mg/dL (ref 70–99)

## 2013-12-18 LAB — PROTIME-INR
INR: 2.08 — AB (ref 0.00–1.49)
Prothrombin Time: 23.6 seconds — ABNORMAL HIGH (ref 11.6–15.2)

## 2013-12-18 LAB — PROTEIN, TOTAL: Total Protein: 7.8 g/dL (ref 6.0–8.3)

## 2013-12-18 MED ORDER — FUROSEMIDE 20 MG PO TABS
20.0000 mg | ORAL_TABLET | Freq: Every day | ORAL | Status: DC
  Administered 2013-12-18 – 2013-12-19 (×2): 20 mg via ORAL
  Filled 2013-12-18 (×2): qty 1

## 2013-12-18 NOTE — Progress Notes (Signed)
Patient ID: [name] Campau, female   DOB: February 16, 1956, 58 y.o.   MRN: [id number] [name]  [name] 58 y.o. February 16, 1956   Subjective: Reports rough night because of unable to sleep, frequent urination, and felt short of breath. No BMs. Passing flatus.  Objective: Vital signs in last 24 hours: Filed Vitals:   12/18/13 0527  BP: 117/46  Pulse: 66  Temp: 98.3 F (36.8 C)  Resp: 18  92-98% on 2 L [place] O2  Physical Exam: Gen: alert, no acute distress CV: RRR Chest: Clear anteriorly Abd: more distended, nontender, +BS  Lab Results:  Recent Labs  12/15/13 1039 12/15/13 1827 12/16/13 0420  NA 139 134* 136*  K 4.3 4.4 4.6  CL 105 101 103  CO2 25 26 26   GLUCOSE 129* 82 140*  BUN 9 11 12   CREATININE 0.44* 0.54 0.61  CALCIUM 8.9 8.8 8.5  MG  --  2.3  --   PHOS  --  3.5  --     Recent Labs  12/15/13 1827 12/16/13 0420  AST 60* 57*  ALT 31 31  ALKPHOS 105 104  BILITOT 4.5* 4.2*  PROT 7.7 7.4  ALBUMIN 2.4* 2.2*    Recent Labs  12/15/13 1039 12/15/13 1827 12/16/13 0420  WBC 9.1 8.7 8.5  NEUTROABS 5.5 5.0  --   HGB 14.5 14.3 13.9  HCT 41.9 41.5 40.3  MCV 101.5* 101.2* 100.8*  PLT 127* 126* 121*    Recent Labs  12/15/13 1151 12/15/13 1827  LABPROT 23.8* 23.7*  INR 2.10* 2.09*      Assessment/Plan: Decompensated cirrhosis with refractory ascites and worsening pleural effusion question hepatic hydrothorax who is now symptomatic. Agree with thoracentesis ordered by Dr. Vanessa BarbaraZamora. Continue Lactulose, diuretics, supportive care. Low sodium diet. Recheck electrolytes, CBC, coags today. Dr. Kristopher Gleeutlaw/Magod to see tomorrow.   Michelle Portell C. 12/18/2013, 10:36 AM

## 2013-12-18 NOTE — Plan of Care (Signed)
Problem: Phase I Progression Outcomes Goal: OOB as tolerated unless otherwise ordered Outcome: Progressing Ambulates to the bathroom     

## 2013-12-18 NOTE — Progress Notes (Signed)
[facility] PROGRESS NOTE  [name] ZOX:[id number] DOB: 1955/04/21 DOA: 12/15/2013 PCP: [name], MD  Assessment/Plan: 1. Cirrhosis -Has history of NASH -She is on [facility] list at [facility] -Undergoing repeat paracentesis on 12/17/2013 with removal of 1.9L of fluid.  -Continue Spironolactone and 50 mg PO q daily, have added lasix 20 mg PO q daily -GI consulted  2. Abdominal Pain -Abdominal XR showing nonobstructive gas pattern.  -Seems improved after undergoing paracentesis although reports not having a bowel movement in the past 3 days which make constipation a possibility.  -Still has not had a bowel movement, lactulose increased to TID dosing -She is afebrile, nontoxic, labs showing normal white count.   3. Chronic Anemia -Hg stable at 14.6  4.  Insulin Dependent Diabetes Mellitus -Blood sugars stable -Continue Insulin detemir with sliding scale coverage.   5. Pleural Effusions -Chest x-ray showing increasing pleural fluid on the left, radiology reporting that left base remains dense and may represent pneumonia, or could also be related to atelectasis -Started empiric antibiotic coverage with Levaquin 750 mg by mouth daily, she remains stable, afebrile, in no acute respiratory distress.  -Patient symptomatic, have ordered an u/s guided thoracentesis with fluid analysis. Spoke with IR may be done today in not in am.   Code Status: Full Code Family Communication: Spoke with family present at bedside Disposition Plan: Plan for thoracentesis, will likely go home upon discharge   Consultants:  GI  Procedures:  U/S guided paracentesis performed on 12/16/2013  U/S Thoracentesis Pending  HPI/Subjective: Patient with a past medical history of nonalcoholic steatohepatitis, undergoing recent paracentesis on 12/13/2013 where 2 L liters of ascitic fluid were removed. She presented with complaints of abdominal distention with associated pain and  discomfort. She underwent u/s guided paracentesis on 12/16/2013 where 1.9 L of ascitic fluid were removed. Post procedure reported relief of her symptoms.  Patient reports feeling better, improved abdominal pain  Objective: Filed Vitals:   12/18/13 1131  BP: 120/50  Pulse: 68  Temp:   Resp:     Intake/Output Summary (Last 24 hours) at 12/18/13 1308 Last data filed at 12/18/13 1100  Gross per 24 hour  Intake    897 ml  Output      0 ml  Net    897 ml   Filed Weights   12/16/13 0608 12/17/13 0402 12/18/13 0527  Weight: 94.5 kg (208 lb 5.4 oz) 91.672 kg (202 lb 1.6 oz) 91.5 kg (201 lb 11.5 oz)    Exam:   General:  Soft, nontender, nondistended  Cardiovascular: Regular rate and rhythm, normal S1S2  Respiratory: Normal inspiratory effort, decreased breath sounds at bases, worse on left  Abdomen: Abdomen soft, nontender, appears more distended today  Musculoskeletal: No extremity edema   Data Reviewed: Basic Metabolic Panel:  Recent Labs Lab 12/15/13 1039 12/15/13 1827 12/16/13 0420 12/18/13 1100  NA 139 134* 136* 137  K 4.3 4.4 4.6 4.4  CL 105 101 103 102  CO2 25 26 26 26   GLUCOSE 129* 82 140* 121*  BUN 9 11 12 8   CREATININE 0.44* 0.54 0.61 0.50  CALCIUM 8.9 8.8 8.5 8.4  MG  --  2.3  --   --   PHOS  --  3.5  --   --    Liver Function Tests:  Recent Labs Lab 12/15/13 1039 12/15/13 1827 12/16/13 0420 12/18/13 1002 12/18/13 1100  AST 67* 60* 57*  --  60*  ALT 37* 31 31  --  33  ALKPHOS 114 105 104  --  108  BILITOT 4.4* 4.5* 4.2*  --  3.3*  PROT 8.1 7.7 7.4 7.8 7.8  ALBUMIN 2.4* 2.4* 2.2*  --  2.2*    Recent Labs Lab 12/15/13 1039  LIPASE 47    Recent Labs Lab 12/15/13 1152  AMMONIA 30   CBC:  Recent Labs Lab 12/15/13 1039 12/15/13 1827 12/16/13 0420 12/18/13 1100  WBC 9.1 8.7 8.5 6.9  NEUTROABS 5.5 5.0  --   --   HGB 14.5 14.3 13.9 14.6  HCT 41.9 41.5 40.3 43.1  MCV 101.5* 101.2* 100.8* 102.6*  PLT 127* 126* 121* 123*    Cardiac Enzymes: No results found for this basename: CKTOTAL, CKMB, CKMBINDEX, TROPONINI,  in the last 168 hours BNP (last 3 results) No results found for this basename: PROBNP,  in the last 8760 hours CBG:  Recent Labs Lab 12/17/13 1208 12/17/13 1649 12/17/13 2105 12/18/13 0723 12/18/13 1210  GLUCAP 154* 92 104* 112* 197*    No results found for this or any previous visit (from the past 240 hour(s)).   Studies: Dg Chest 2 View  12/17/2013   CLINICAL DATA:  Pneumonia and chest tightness and shortness of breath ; history of COPD, diabetes, and hepatic encephalopathy  EXAM: CHEST  2 VIEW  COMPARISON:  PA and lateral chest x-ray of December 16, 2013  FINDINGS: There is increased volume loss on the left consistent with accumulation of additional pleural fluid. The aerated portion of the left lung exhibits mildly increased density. The right lung is clear. The cardiac silhouette where visualized is normal. The pulmonary vascularity is not engorged. The trachea is midline. The bony thorax exhibits no acute abnormality.  IMPRESSION: Increasing volume of pleural fluid on the left. The left base remains dense and may obscure pneumonia or other pathology.   Electronically Signed   By: David  [place]   On: 12/17/2013 10:58   Dg Chest 2 View  12/16/2013   CLINICAL DATA:  Shortness of breath. History of hepatic encephalopathy, hypertension, diabetes and COPD  EXAM: CHEST  2 VIEW  COMPARISON:  12/06/2010; 09/07/2008; chest CT - 12/06/2010  FINDINGS: Interval development of a small layering left-sided effusion with associated left mid and lower lung heterogeneous/ consolidative opacities. There is partial obscuration of the left heart border. Otherwise, grossly unchanged enlarged cardiac silhouette and mediastinal contours. Atherosclerotic plaque within the thoracic aorta. Mild pulmonary venous congestion without frank evidence of edema. Grossly unchanged right basilar opacities favored to represent  atelectasis. Unchanged bones. Post cholecystectomy.  IMPRESSION: 1. Interval development of a small layering left-sided effusion with associated left mid and lower lung opacities, atelectasis versus infiltrate. A follow-up chest radiograph in 4 to 6 weeks after treatment is recommended to ensure resolution. 2. Pulmonary venous congestion without frank evidence of edema.   Electronically Signed   By: Simonne Come M.D.   On: 12/16/2013 16:44   Dg Abd 2 Views  12/16/2013   CLINICAL DATA:  Abdominal distention, pain, nausea, vomiting and constipation. Initial encounter appear  EXAM: ABDOMEN - 2 VIEW  COMPARISON:  Ultrasound-guided paracentesis -12/16/2013  FINDINGS: Nonobstructive bowel gas pattern. No pneumoperitoneum, pneumatosis or portal venous gas.  Limited visualization of lower thorax demonstrates small to moderate size left-sided effusion with associated left basilar heterogeneous/ consolidative opacities.  Post cholecystectomy. No definite abnormal intra-abdominal calcifications.  No acute osseus abnormalities.  IMPRESSION: 1. Nonobstructive bowel-gas pattern 2. Note is made of a small to moderate-sized left-sided pleural effusion with  associated left basilar heterogeneous/consolidative opacities - atelectasis versus infiltrate. Further evaluation with PA and lateral chest radiograph could be performed as clinically indicated. These results will be called to the ordering clinician or representative by the Radiologist Assistant, and communication documented in the PACS or zVision Dashboard.   Electronically Signed   By: Simonne ComeJohn  Watts M.D.   On: 12/16/2013 14:00    Scheduled Meds: . aspirin EC  81 mg Oral q morning - 10a  . cholecalciferol  400 Units Oral q morning - 10a  . clobetasol cream  1 application Topical BID  . colesevelam  1,875 mg Oral BID  . DULoxetine  60 mg Oral q morning - 10a  . Empagliflozin  10 mg Oral Daily  . ezetimibe  10 mg Oral QHS  . feeding supplement (ENSURE COMPLETE)  237 mL  Oral BID BM  . ferrous sulfate  325 mg Oral BID WC  . fluocinonide cream   Topical BID  . gabapentin  300 mg Oral QHS  . insulin aspart  0-15 Units Subcutaneous TID WC  . insulin aspart  0-5 Units Subcutaneous QHS  . insulin aspart  14 Units Subcutaneous Q supper  . insulin detemir  80 Units Subcutaneous q morning - 10a  . lactulose  30 g Oral TID  . levofloxacin  750 mg Oral Daily  . meloxicam  7.5 mg Oral q morning - 10a  . pantoprazole  40 mg Oral Daily  . propranolol  10 mg Oral BID  . rifaximin  550 mg Oral BID  . spironolactone  50 mg Oral q morning - 10a   Continuous Infusions: . sodium chloride 10 mL/hr at 12/15/13 1811    Principal Problem:   Abdominal distension Active Problems:   NASH (nonalcoholic steatohepatitis)   Ascites   Dyslipidemia   Diabetes   Anemia of chronic disease   Depression    Time spent: 30 min    Jeralyn BennettZAMORA, Michelle Swaim  [facility] Pager [phone]. If 7PM-7AM, please contact night-coverage at [email or web address], password [email or web address] 12/18/2013, 1:08 PM  LOS: 3 days

## 2013-12-18 NOTE — Progress Notes (Signed)
Family at bedside. 

## 2013-12-19 ENCOUNTER — Inpatient Hospital Stay (HOSPITAL_COMMUNITY): Payer: Managed Care, Other (non HMO)

## 2013-12-19 ENCOUNTER — Other Ambulatory Visit (HOSPITAL_COMMUNITY): Payer: Self-pay | Admitting: Gastroenterology

## 2013-12-19 LAB — CBC
HEMATOCRIT: 40.7 % (ref 36.0–46.0)
Hemoglobin: 13.8 g/dL (ref 12.0–15.0)
MCH: 35.2 pg — AB (ref 26.0–34.0)
MCHC: 33.9 g/dL (ref 30.0–36.0)
MCV: 103.8 fL — ABNORMAL HIGH (ref 78.0–100.0)
Platelets: 123 10*3/uL — ABNORMAL LOW (ref 150–400)
RBC: 3.92 MIL/uL (ref 3.87–5.11)
RDW: 15.1 % (ref 11.5–15.5)
WBC: 7 10*3/uL (ref 4.0–10.5)

## 2013-12-19 LAB — GLUCOSE, SEROUS FLUID: Glucose, Fluid: 140 mg/dL

## 2013-12-19 LAB — BASIC METABOLIC PANEL
Anion gap: 8 (ref 5–15)
BUN: 8 mg/dL (ref 6–23)
CALCIUM: 8 mg/dL — AB (ref 8.4–10.5)
CO2: 24 meq/L (ref 19–32)
CREATININE: 0.47 mg/dL — AB (ref 0.50–1.10)
Chloride: 103 mEq/L (ref 96–112)
GFR calc Af Amer: >90 mL/min (ref >90)
GFR calc non Af Amer: >90 mL/min (ref >90)
Glucose, Bld: 112 mg/dL — ABNORMAL HIGH (ref 70–99)
Potassium: 4.3 mEq/L (ref 3.7–5.3)
Sodium: 135 mEq/L — ABNORMAL LOW (ref 137–147)

## 2013-12-19 LAB — PH, BODY FLUID: pH, Fluid: 8

## 2013-12-19 LAB — ANTI-NUCLEAR AB-TITER (ANA TITER): ANA TITER 1: NEGATIVE (ref <1:40)

## 2013-12-19 LAB — BODY FLUID CELL COUNT WITH DIFFERENTIAL
Eos, Fluid: 0 %
Lymphs, Fluid: 37 %
Monocyte-Macrophage-Serous Fluid: 55 % (ref 50–90)
Neutrophil Count, Fluid: 8 % (ref 0–25)
Total Nucleated Cell Count, Fluid: 844 cu mm (ref 0–1000)

## 2013-12-19 LAB — GLUCOSE, CAPILLARY
GLUCOSE-CAPILLARY: 117 mg/dL — AB (ref 70–99)
GLUCOSE-CAPILLARY: 156 mg/dL — AB (ref 70–99)

## 2013-12-19 LAB — LACTATE DEHYDROGENASE, PLEURAL OR PERITONEAL FLUID: LD FL: 58 U/L — AB (ref 3–23)

## 2013-12-19 LAB — ALBUMIN, FLUID (OTHER): ALBUMIN FL: 0.5 g/dL

## 2013-12-19 LAB — ANA: ANA: POSITIVE — AB

## 2013-12-19 LAB — ANTI-DNA ANTIBODY, DOUBLE-STRANDED: ds DNA Ab: <1 IU/mL

## 2013-12-19 MED ORDER — LEVOFLOXACIN 750 MG PO TABS: 750.0000 mg | ORAL_TABLET | Freq: Every day | ORAL | Status: DC

## 2013-12-19 MED ORDER — FUROSEMIDE 20 MG PO TABS: 20.0000 mg | ORAL_TABLET | Freq: Every day | ORAL | Status: DC

## 2013-12-19 NOTE — Procedures (Signed)
Successful US guided left thoracentesis. Yielded 1.2 liters of yellow/amber fluid. Pt tolerated procedure well. No immediate complications.  Specimen was sent for labs. CXR ordered.  Pattricia BossMORGAN, Ronnisha Felber D PA-C 12/19/2013 10:23 AM

## 2013-12-19 NOTE — Discharge Summary (Signed)
Physician Discharge Summary  [name] ZOX:[id number] DOB: Sep 22, 1955 DOA: 12/15/2013  PCP: [name], MD  Admit date: 12/15/2013 Discharge date: 12/19/2013  Time spent: 35 minutes  Recommendations for Outpatient Follow-up:  1. Please follow up on CMP and CBC in 1 week   Discharge Diagnoses:  Principal Problem:   Abdominal distension Active Problems:   NASH (nonalcoholic steatohepatitis)   Ascites   Dyslipidemia   Diabetes   Anemia of chronic disease   Depression   Discharge Condition: Stable/Improved  Diet recommendation: Low Sodium Diet  Filed Weights   12/18/13 0527 12/19/13 0455 12/19/13 0947  Weight: 91.5 kg (201 lb 11.5 oz) 91.8 kg (202 lb 6.1 oz) 91.899 kg (202 lb 9.6 oz)    History of present illness:  58 year old female with past medical history of NASH, frequent paracenteses (last 12/13/13 with 2 L fluid drained), diabetes, anemia, dyslipidemia who presented to [facility] ED 12/15/2013 with abdominal pain ever since having paracentesis 12/13/13. Patient reported her swelling is still about the same and she has mid abdominal pain, 8/10 in intensity. Patient also reported non bloody diarrhea, vomiting occasionally but not consistently. No reports of chest pain, cough, fevers, chills. No shortness of breath. No reports of lightheadedness or loss of consciousness. No mental status changes.  In ED, BP was 107/48, HR 78, RR 15-20, T max 98.4 F and oxygen saturation 89% on room air. Blood work revealed platelets 127, INR 2.10 otherwise unremarkable. Patient admitted for further evaluation and need for yet additional paracentesis.   Hospital Course:  Patient with a past medical history of nonalcoholic steatohepatitis, undergoing recent paracentesis on 12/13/2013 where 2 L liters of ascitic fluid were removed. She presented with complaints of abdominal distention with associated pain and discomfort.  She underwent u/s guided paracentesis on 12/16/2013 where 1.9 L of ascitic  fluid were removed. Post procedure reported relief of her symptoms. She complained of worsening shortness of breath for which CXR that was done on 12/17/2013 showed increasing volume of pleural fluid on the left. On 12/19/2013 she underwent u/s guided thoracentesis with removal of 1.2 liters of fluid.  1. Cirrhosis -Has history of NASH  -She is on [facility] list [facility]  -Undergoing repeat paracentesis on 12/17/2013 with removal of 1.9L of fluid and thoracentesis with removal of 1.2 liters of fluid -Continue Spironolactone and 50 mg PO q daily, have added lasix 20 mg PO q daily  -Will follow up with GI in the office  2. Constipation  -Abdominal XR showing nonobstructive gas pattern.  -Had bowel movement with lactulose  3. Chronic Anemia  -Hg stable at 14.6  4. Insulin Dependent Diabetes Mellitus  -Blood sugars stable  -Continue Insulin detemir with sliding scale coverage.  5. Pleural Effusions  -Chest x-ray showing increasing pleural fluid on the left, radiology reporting that left base remains dense and may represent pneumonia, or could also be related to atelectasis  -Started empiric antibiotic coverage with Levaquin 750 mg by mouth daily, she remains stable, afebrile, in no acute respiratory distress.  -S/P U/S guided thoracentesis with removal of 1.2 liters of fluid   Procedures:  U/S guided Paracentesis performed on 12/16/2013 by IR  U/S guided thoracentesis performed on 12/19/2013 by IR  Consultations:  GI  IR  Discharge Exam: Filed Vitals:   12/19/13 1020  BP: 104/29  Pulse:   Temp:   Resp:     General: Patient reports feeling better after thoracentesis, asking to go home Cardiovascular: Regular rate and rhythm, normal  S1S2  Respiratory: Normal inspiratory effort, good air movement no wheezes rhonchi or rales Abdomen: Abdomen soft, nontender positive fluid wave Musculoskeletal: No extremity edema    Discharge Instructions You were cared for by a  hospitalist during your hospital stay. If you have any questions about your discharge medications or the care you received while you were in the hospital after you are discharged, you can call the unit and asked to speak with the hospitalist on call if the hospitalist that took care of you is not available. Once you are discharged, your primary care physician will handle any further medical issues. Please note that NO REFILLS for any discharge medications will be authorized once you are discharged, as it is imperative that you return to your primary care physician (or establish a relationship with a primary care physician if you do not have one) for your aftercare needs so that they can reassess your need for medications and monitor your lab values.  Discharge Instructions   Call MD for:  difficulty breathing, headache or visual disturbances    Complete by:  As directed      Call MD for:  extreme fatigue    Complete by:  As directed      Call MD for:  hives    Complete by:  As directed      Call MD for:  persistant dizziness or light-headedness    Complete by:  As directed      Call MD for:  persistant nausea and vomiting    Complete by:  As directed      Call MD for:  redness, tenderness, or signs of infection (pain, swelling, redness, odor or green/yellow discharge around incision site)    Complete by:  As directed      Call MD for:  severe uncontrolled pain    Complete by:  As directed      Call MD for:  temperature >100.4    Complete by:  As directed      Diet - low sodium heart healthy    Complete by:  As directed      Increase activity slowly    Complete by:  As directed           Current Discharge Medication List    START taking these medications   Details  furosemide (LASIX) 20 MG tablet Take 1 tablet (20 mg total) by mouth daily. Qty: 30 tablet, Refills: 1    levofloxacin (LEVAQUIN) 750 MG tablet Take 1 tablet (750 mg total) by mouth daily. Qty: 2 tablet, Refills: 0       CONTINUE these medications which have NOT CHANGED   Details  aspirin EC 81 MG tablet Take 81 mg by mouth every morning.    cholecalciferol (VITAMIN D) 400 UNITS TABS tablet Take 400 Units by mouth every morning.    clobetasol (OLUX) 0.05 % topical foam Apply 1 application topically 2 (two) times daily.     clobetasol cream (TEMOVATE) 0.05 % Apply 1 application topically 2 (two) times daily.    colesevelam (WELCHOL) 625 MG tablet Take 1,875 mg by mouth 2 (two) times daily. Three pills taken per dosage. Total of six per day.    diphenhydrAMINE (BENADRYL) 25 MG tablet Take 25 mg by mouth every 6 (six) hours as needed for itching (itching).    DULoxetine (CYMBALTA) 60 MG capsule Take 60 mg by mouth every morning.     Empagliflozin (JARDIANCE) 10 MG TABS Take 10 mg by mouth daily.  esomeprazole (NEXIUM) 40 MG capsule Take 40 mg by mouth daily before breakfast.     ezetimibe (ZETIA) 10 MG tablet Take 10 mg by mouth at bedtime.     ferrous sulfate 325 (65 FE) MG tablet Take 325 mg by mouth 2 (two) times daily.    fluocinonide cream (LIDEX) 0.05 % Apply 1 application topically 2 (two) times daily.    gabapentin (NEURONTIN) 300 MG capsule Take 300 mg by mouth at bedtime.     insulin detemir (LEVEMIR) 100 UNIT/ML injection Inject 80 Units into the skin every morning.     insulin lispro (HUMALOG) 100 UNIT/ML injection Inject 14 Units into the skin daily with supper.     lactulose (CHRONULAC) 10 GM/15ML solution Take 20 g by mouth 3 (three) times daily as needed for mild constipation (constipation).     meloxicam (MOBIC) 7.5 MG tablet Take 7.5 mg by mouth every morning.     omeprazole (PRILOSEC) 20 MG capsule Take 20 mg by mouth daily.    propranolol (INDERAL) 10 MG tablet Take 10 mg by mouth 2 (two) times daily.     rifaximin (XIFAXAN) 550 MG TABS tablet Take 550 mg by mouth 2 (two) times daily.    spironolactone (ALDACTONE) 50 MG tablet Take 50 mg by mouth every morning.    zinc  gluconate 50 MG tablet Take 50 mg by mouth daily.       Allergies  Allergen Reactions  . Latex Rash  . Actos [Pioglitazone] Swelling and Other (See Comments)    edema Edema   . Byetta 10 Mcg Pen [Exenatide] Other (See Comments)    Stomach upset  . Crestor [Rosuvastatin] Other (See Comments)    Other reaction(s): Liver Disorder Elevated LFT's Increases LFT  . Lipitor [Atorvastatin] Other (See Comments)    Other reaction(s): Muscle Pain Muscle aches  . Other Other (See Comments)    Stomach upset  . Penicillin G Hives  . Penicillins Hives and Itching    All over the body.   Follow-up Information   Follow up with [name], MD In 2 weeks.   Specialty:  Family Medicine   Contact information:   [address] [place] [address] [phone]       Follow up with Freddy Jaksch, MD In 1 week.   Specialty:  Gastroenterology   Contact information:   [address], [address] [place] [address] [phone]        The results of significant diagnostics from this hospitalization (including imaging, microbiology, ancillary and laboratory) are listed below for reference.    Significant Diagnostic Studies: Dg Chest 1 View  12/19/2013   CLINICAL DATA:  Status post left-sided thoracentesis for effusion  EXAM: CHEST - 1 VIEW  COMPARISON:  December 17, 2013  FINDINGS: Effusion is smaller on the left post thoracentesis. Moderate effusion remains on the left with left base consolidation/atelectasis. Right lung is clear. Heart is upper normal in size with pulmonary vascularity within normal limits. No adenopathy. No bone lesions.  IMPRESSION: Left effusion smaller post thoracentesis. Moderate effusion remains with left base atelectasis/ consolidation. Right lung clear. No apparent pneumothorax.   Electronically Signed   By: Bretta Bang M.D.   On: 12/19/2013 10:44   Dg Chest 2 View  12/17/2013   CLINICAL DATA:  Pneumonia and chest tightness and shortness of  breath ; history of COPD, diabetes, and hepatic encephalopathy  EXAM: CHEST  2 VIEW  COMPARISON:  PA and lateral chest x-ray of  December 16, 2013  FINDINGS: There is increased volume loss on the left consistent with accumulation of additional pleural fluid. The aerated portion of the left lung exhibits mildly increased density. The right lung is clear. The cardiac silhouette where visualized is normal. The pulmonary vascularity is not engorged. The trachea is midline. The bony thorax exhibits no acute abnormality.  IMPRESSION: Increasing volume of pleural fluid on the left. The left base remains dense and may obscure pneumonia or other pathology.   Electronically Signed   By: David  SwazilandJordan   On: 12/17/2013 10:58   Dg Chest 2 View  12/16/2013   CLINICAL DATA:  Shortness of breath. History of hepatic encephalopathy, hypertension, diabetes and COPD  EXAM: CHEST  2 VIEW  COMPARISON:  12/06/2010; 09/07/2008; chest CT - 12/06/2010  FINDINGS: Interval development of a small layering left-sided effusion with associated left mid and lower lung heterogeneous/ consolidative opacities. There is partial obscuration of the left heart border. Otherwise, grossly unchanged enlarged cardiac silhouette and mediastinal contours. Atherosclerotic plaque within the thoracic aorta. Mild pulmonary venous congestion without frank evidence of edema. Grossly unchanged right basilar opacities favored to represent atelectasis. Unchanged bones. Post cholecystectomy.  IMPRESSION: 1. Interval development of a small layering left-sided effusion with associated left mid and lower lung opacities, atelectasis versus infiltrate. A follow-up chest radiograph in 4 to 6 weeks after treatment is recommended to ensure resolution. 2. Pulmonary venous congestion without frank evidence of edema.   Electronically Signed   By: Simonne ComeJohn  Watts M.D.   On: 12/16/2013 16:44   [place] Paracentesis  12/16/2013   CLINICAL DATA:  Cirrhosis, recurrent ascites. Request is made  for therapeutic paracentesis.  EXAM: ULTRASOUND GUIDED THERAPEUTIC PARACENTESIS  COMPARISON:  PRIOR PARACENTESIS ON 12/13/2013  PROCEDURE: An ultrasound guided paracentesis was thoroughly discussed with the patient and questions answered. The benefits, risks, alternatives and complications were also discussed. The patient understands and wishes to proceed with the procedure. Written consent was obtained.  Ultrasound was performed to localize and mark an adequate pocket of fluid in the right mid to lower quadrant of the abdomen. The area was then prepped and draped in the normal sterile fashion. 1% Lidocaine was used for local anesthesia. Under ultrasound guidance a 19 gauge Yueh catheter was introduced. Paracentesis was performed. The catheter was removed and a dressing applied.  Complications: None.  FINDINGS: A total of approximately 1.9 liters of yellow fluid was removed.  IMPRESSION: Successful ultrasound guided therapeutic paracentesis yielding 1.9 liters of ascites.  Read by: Jeananne RamaKevin Allred, PA-C   Electronically Signed   By: Oley Balmaniel  Hassell M.D.   On: 12/16/2013 13:50   [place] Paracentesis  12/13/2013   CLINICAL DATA:  Cirrhosis, recurrent ascites. Request is made for therapeutic paracentesis.  EXAM: ULTRASOUND GUIDED THERAPEUTIC PARACENTESIS  COMPARISON:  PRIOR PARACENTESIS ON 04/21/2013  PROCEDURE: An ultrasound guided paracentesis was thoroughly discussed with the patient and questions answered. The benefits, risks, alternatives and complications were also discussed. The patient understands and wishes to proceed with the procedure. Written consent was obtained.  Ultrasound was performed to localize and mark an adequate pocket of fluid in the left lower quadrant of the abdomen. The area was then prepped and draped in the normal sterile fashion. 1% Lidocaine was used for local anesthesia. Under ultrasound guidance a 19 gauge Yueh catheter was introduced. Paracentesis was performed. The catheter was removed and  a dressing applied.  Complications: None.  FINDINGS: A total of approximately 2 liters of yellow fluid was removed.  IMPRESSION: Successful ultrasound guided therapeutic paracentesis yielding 2 liters of ascites.  Read by: Jeananne Rama, PA-C   Electronically Signed   By: Gilmer Mor O.D.   On: 12/13/2013 15:12   Dg Abd 2 Views  12/16/2013   CLINICAL DATA:  Abdominal distention, pain, nausea, vomiting and constipation. Initial encounter appear  EXAM: ABDOMEN - 2 VIEW  COMPARISON:  Ultrasound-guided paracentesis -12/16/2013  FINDINGS: Nonobstructive bowel gas pattern. No pneumoperitoneum, pneumatosis or portal venous gas.  Limited visualization of lower thorax demonstrates small to moderate size left-sided effusion with associated left basilar heterogeneous/ consolidative opacities.  Post cholecystectomy. No definite abnormal intra-abdominal calcifications.  No acute osseus abnormalities.  IMPRESSION: 1. Nonobstructive bowel-gas pattern 2. Note is made of a small to moderate-sized left-sided pleural effusion with associated left basilar heterogeneous/consolidative opacities - atelectasis versus infiltrate. Further evaluation with PA and lateral chest radiograph could be performed as clinically indicated. These results will be called to the ordering clinician or representative by the Radiologist Assistant, and communication documented in the PACS or zVision Dashboard.   Electronically Signed   By: Simonne Come M.D.   On: 12/16/2013 14:00   US Thoracentesis Asp Pleural Space W/img Guide  12/19/2013   CLINICAL DATA:  Cirrhosis, shortness of breath, left pleural effusion, request for thoracentesis.  EXAM: ULTRASOUND GUIDED DIAGNOSTIC AND THERAPEUTIC THORACENTESIS  COMPARISON:  None.  PROCEDURE: An ultrasound guided thoracentesis was thoroughly discussed with the patient and questions answered. The benefits, risks, alternatives and complications were also discussed. The patient understands and wishes to proceed with  the procedure. Written consent was obtained.  Ultrasound was performed to localize and mark an adequate pocket of fluid in the left chest. The area was then prepped and draped in the normal sterile fashion. 1% Lidocaine was used for local anesthesia. Under ultrasound guidance a 19 gauge Yueh catheter was introduced. Thoracentesis was performed. The catheter was removed and a dressing applied.  Complications:  None immediate.  FINDINGS: A total of approximately 1.2 liters of yellow/amber fluid was removed. A fluid sample was sent for laboratory analysis.  IMPRESSION: Successful ultrasound guided left thoracentesis yielding 1.2 liters of pleural fluid.  Read By:  Pattricia Boss PA-C   Electronically Signed   By: Simonne Come M.D.   On: 12/19/2013 10:52    Microbiology: No results found for this or any previous visit (from the past 240 hour(s)).   Labs: Basic Metabolic Panel:  Recent Labs Lab 12/15/13 1039 12/15/13 1827 12/16/13 0420 12/18/13 1100 12/19/13 0410  NA 139 134* 136* 137 135*  K 4.3 4.4 4.6 4.4 4.3  CL 105 101 103 102 103  CO2 25 26 26 26 24   GLUCOSE 129* 82 140* 121* 112*  BUN 9 11 12 8 8   CREATININE 0.44* 0.54 0.61 0.50 0.47*  CALCIUM 8.9 8.8 8.5 8.4 8.0*  MG  --  2.3  --   --   --   PHOS  --  3.5  --   --   --    Liver Function Tests:  Recent Labs Lab 12/15/13 1039 12/15/13 1827 12/16/13 0420 12/18/13 1002 12/18/13 1100  AST 67* 60* 57*  --  60*  ALT 37* 31 31  --  33  ALKPHOS 114 105 104  --  108  BILITOT 4.4* 4.5* 4.2*  --  3.3*  PROT 8.1 7.7 7.4 7.8 7.8  ALBUMIN 2.4* 2.4* 2.2*  --  2.2*    Recent Labs Lab 12/15/13 1039  LIPASE 47  Recent Labs Lab 12/15/13 1152  AMMONIA 30   CBC:  Recent Labs Lab 12/15/13 1039 12/15/13 1827 12/16/13 0420 12/18/13 1100 12/19/13 0410  WBC 9.1 8.7 8.5 6.9 7.0  NEUTROABS 5.5 5.0  --   --   --   HGB 14.5 14.3 13.9 14.6 13.8  HCT 41.9 41.5 40.3 43.1 40.7  MCV 101.5* 101.2* 100.8* 102.6* 103.8*  PLT 127*  126* 121* 123* 123*   Cardiac Enzymes: No results found for this basename: CKTOTAL, CKMB, CKMBINDEX, TROPONINI,  in the last 168 hours BNP: BNP (last 3 results) No results found for this basename: PROBNP,  in the last 8760 hours CBG:  Recent Labs Lab 12/18/13 1210 12/18/13 1627 12/18/13 2111 12/19/13 0741 12/19/13 1131  GLUCAP 197* 146* 94 117* 156*       Signed:  Nickia Boesen  [facility] 12/19/2013, 12:40 PM

## 2013-12-19 NOTE — Progress Notes (Signed)
Subjective: Breathing improved. Having regular bowel movements  Objective: Vital signs in last 24 hours: Temp:  [97.8 F (36.6 C)-98.1 F (36.7 C)] 98 F (36.7 C) (10/05 0455) Pulse Rate:  [66-71] 71 (10/05 0455) Resp:  [16-20] 16 (10/05 0455) BP: (104-126)/(29-53) 104/29 mmHg (10/05 1020) SpO2:  [92 %-97 %] 97 % (10/05 1020) Weight:  [91.8 kg (202 lb 6.1 oz)-91.899 kg (202 lb 9.6 oz)] 91.899 kg (202 lb 9.6 oz) (10/05 0947) Weight change: 0.3 kg (10.6 oz) Last BM Date: 12/18/13  PE: GEN:  NAD, older-appearing than stated age SKIN:  Jaundiced, scattered telangiectasias and ecchymoses ABD:  Moderate ascites, soft, non-tender  Lab Results: CBC    Component Value Date/Time   WBC 7.0 12/19/2013 0410   RBC 3.92 12/19/2013 0410   HGB 13.8 12/19/2013 0410   HCT 40.7 12/19/2013 0410   PLT 123* 12/19/2013 0410   MCV 103.8* 12/19/2013 0410   MCH 35.2* 12/19/2013 0410   MCHC 33.9 12/19/2013 0410   RDW 15.1 12/19/2013 0410   LYMPHSABS 2.5 12/15/2013 1827   MONOABS 1.1* 12/15/2013 1827   EOSABS 0.1 12/15/2013 1827   BASOSABS 0.0 12/15/2013 1827   CMP     Component Value Date/Time   NA 135* 12/19/2013 0410   K 4.3 12/19/2013 0410   CL 103 12/19/2013 0410   CO2 24 12/19/2013 0410   GLUCOSE 112* 12/19/2013 0410   BUN 8 12/19/2013 0410   CREATININE 0.47* 12/19/2013 0410   CALCIUM 8.0* 12/19/2013 0410   PROT 7.8 12/18/2013 1100   ALBUMIN 2.2* 12/18/2013 1100   AST 60* 12/18/2013 1100   ALT 33 12/18/2013 1100   ALKPHOS 108 12/18/2013 1100   BILITOT 3.3* 12/18/2013 1100   GFRNONAA >90 12/19/2013 0410   GFRAA >90 12/19/2013 0410   Assessment:  1.  Cirrhosis, complicated by ascites and, now, hepatic hydrothorax.  Plan:  1.  Patient to be discharged today. 2.  Would place on 1500 mL/day fluid restriction and low sodium diet (< 2 g/day). 3.  Restarting diuretics, I'll see her next week and will likely continue to adjust. 4.  [facility] has been informed of her admission; I will see her back in  office next Monday and [facility]. 5.  Will sign-off; please call with questions; thank you for the consult.   Michelle JakschUTLAW,Lujain Kraszewski M 12/19/2013, 1:27 PM

## 2013-12-20 LAB — PATHOLOGIST SMEAR REVIEW

## 2013-12-21 ENCOUNTER — Ambulatory Visit (INDEPENDENT_AMBULATORY_CARE_PROVIDER_SITE_OTHER): Payer: Managed Care, Other (non HMO) | Admitting: Podiatry

## 2013-12-21 ENCOUNTER — Encounter: Payer: Self-pay | Admitting: Podiatry

## 2013-12-21 VITALS — BP 118/54 | HR 63 | Resp 16 | Ht 65.0 in | Wt 200.0 lb

## 2013-12-21 DIAGNOSIS — M79673 Pain in unspecified foot: Secondary | ICD-10-CM

## 2013-12-21 DIAGNOSIS — B351 Tinea unguium: Secondary | ICD-10-CM

## 2013-12-21 NOTE — Progress Notes (Signed)
   Subjective:    Patient ID: [name], female    DOB: June 04, 1955, 58 y.o.   MRN: [id number]  HPI Comments: "I need my feet checked"  Patient states that she is diabetic and needs her feet examined. She has trouble with hard, thick skin plantar bilateral. She also needs the nails trimmed.   Diabetes Hypoglycemia symptoms include dizziness and nervousness/anxiousness. Associated symptoms include chest pain, fatigue, polyphagia and weakness.      Review of Systems  Constitutional: Positive for fever, chills, diaphoresis, activity change, appetite change, fatigue and unexpected weight change.  HENT: Positive for tinnitus.   Eyes: Positive for itching and visual disturbance.  Respiratory: Positive for apnea, chest tightness and shortness of breath.   Cardiovascular: Positive for chest pain, palpitations and leg swelling.  Gastrointestinal: Positive for nausea, vomiting, abdominal pain, diarrhea, constipation, blood in stool and abdominal distention.  Endocrine: Positive for polyphagia.  Musculoskeletal: Positive for arthralgias and myalgias.  Skin: Positive for wound.  Neurological: Positive for dizziness, weakness and light-headedness.  Hematological: Bruises/bleeds easily.  Psychiatric/Behavioral: The patient is nervous/anxious.   All other systems reviewed and are negative.      Objective:   Physical Exam        Assessment & Plan:

## 2013-12-21 NOTE — Patient Instructions (Signed)
Diabetes and Foot Care Diabetes may cause you to have problems because of poor blood supply (circulation) to your feet and legs. This may cause the skin on your feet to become thinner, break easier, and heal more slowly. Your skin may become dry, and the skin may peel and crack. You may also have nerve damage in your legs and feet causing decreased feeling in them. You may not notice minor injuries to your feet that could lead to infections or more serious problems. Taking care of your feet is one of the most important things you can do for yourself.  HOME CARE INSTRUCTIONS  Wear shoes at all times, even in the house. Do not go barefoot. Bare feet are easily injured.  Check your feet daily for blisters, cuts, and redness. If you cannot see the bottom of your feet, use a mirror or ask someone for help.  Wash your feet with warm water (do not use hot water) and mild soap. Then pat your feet and the areas between your toes until they are completely dry. Do not soak your feet as this can dry your skin.  Apply a moisturizing lotion or petroleum jelly (that does not contain alcohol and is unscented) to the skin on your feet and to dry, brittle toenails. Do not apply lotion between your toes.  Trim your toenails straight across. Do not dig under them or around the cuticle. File the edges of your nails with an emery board or nail file.  Do not cut corns or calluses or try to remove them with medicine.  Wear clean socks or stockings every day. Make sure they are not too tight. Do not wear knee-high stockings since they may decrease blood flow to your legs.  Wear shoes that fit properly and have enough cushioning. To break in new shoes, wear them for just a few hours a day. This prevents you from injuring your feet. Always look in your shoes before you put them on to be sure there are no objects inside.  Do not cross your legs. This may decrease the blood flow to your feet.  If you find a minor scrape,  cut, or break in the skin on your feet, keep it and the skin around it clean and dry. These areas may be cleansed with mild soap and water. Do not cleanse the area with peroxide, alcohol, or iodine.  When you remove an adhesive bandage, be sure not to damage the skin around it.  If you have a wound, look at it several times a day to make sure it is healing.  Do not use heating pads or hot water bottles. They may burn your skin. If you have lost feeling in your feet or legs, you may not know it is happening until it is too late.  Make sure your health care provider performs a complete foot exam at least annually or more often if you have foot problems. Report any cuts, sores, or bruises to your health care provider immediately. SEEK MEDICAL CARE IF:   You have an injury that is not healing.  You have cuts or breaks in the skin.  You have an ingrown nail.  You notice redness on your legs or feet.  You feel burning or tingling in your legs or feet.  You have pain or cramps in your legs and feet.  Your legs or feet are numb.  Your feet always feel cold. SEEK IMMEDIATE MEDICAL CARE IF:   There is increasing redness,   swelling, or pain in or around a wound.  There is a red line that goes up your leg.  Pus is coming from a wound.  You develop a fever or as directed by your health care provider.  You notice a bad smell coming from an ulcer or wound. Document Released: 02/29/2000 Document Revised: 11/03/2012 Document Reviewed: 08/10/2012 Exit[employer] Patient Information 2015 [employer]. This information is not intended to replace advice given to you by your health care provider. Make sure you discuss any questions you have with your health care provider.  

## 2013-12-22 ENCOUNTER — Encounter (HOSPITAL_COMMUNITY): Payer: Self-pay | Admitting: Emergency Medicine

## 2013-12-22 ENCOUNTER — Inpatient Hospital Stay (HOSPITAL_COMMUNITY): Payer: Managed Care, Other (non HMO)

## 2013-12-22 ENCOUNTER — Emergency Department (HOSPITAL_COMMUNITY): Payer: Managed Care, Other (non HMO)

## 2013-12-22 ENCOUNTER — Inpatient Hospital Stay (HOSPITAL_COMMUNITY)
Admission: EM | Admit: 2013-12-22 | Discharge: 2013-12-31 | DRG: 421 | Disposition: A | Discharge status: Home / Self Care | Payer: Managed Care, Other (non HMO) | Attending: Family Medicine | Admitting: Family Medicine

## 2013-12-22 DIAGNOSIS — J948 Other specified pleural conditions: Secondary | ICD-10-CM | POA: Diagnosis present

## 2013-12-22 DIAGNOSIS — E785 Hyperlipidemia, unspecified: Secondary | ICD-10-CM | POA: Diagnosis present

## 2013-12-22 DIAGNOSIS — R0602 Shortness of breath: Secondary | ICD-10-CM | POA: Diagnosis present

## 2013-12-22 DIAGNOSIS — K7581 Nonalcoholic steatohepatitis (NASH): Principal | ICD-10-CM | POA: Diagnosis present

## 2013-12-22 DIAGNOSIS — J918 Pleural effusion in other conditions classified elsewhere: Secondary | ICD-10-CM | POA: Diagnosis present

## 2013-12-22 DIAGNOSIS — D689 Coagulation defect, unspecified: Secondary | ICD-10-CM | POA: Diagnosis present

## 2013-12-22 DIAGNOSIS — J90 Pleural effusion, not elsewhere classified: Secondary | ICD-10-CM

## 2013-12-22 DIAGNOSIS — Z8249 Family history of ischemic heart disease and other diseases of the circulatory system: Secondary | ICD-10-CM

## 2013-12-22 DIAGNOSIS — G2581 Restless legs syndrome: Secondary | ICD-10-CM | POA: Diagnosis present

## 2013-12-22 DIAGNOSIS — K811 Chronic cholecystitis: Secondary | ICD-10-CM

## 2013-12-22 DIAGNOSIS — Z7982 Long term (current) use of aspirin: Secondary | ICD-10-CM | POA: Diagnosis not present

## 2013-12-22 DIAGNOSIS — E10649 Type 1 diabetes mellitus with hypoglycemia without coma: Secondary | ICD-10-CM | POA: Diagnosis present

## 2013-12-22 DIAGNOSIS — Z7682 Awaiting organ transplant status: Secondary | ICD-10-CM | POA: Diagnosis not present

## 2013-12-22 DIAGNOSIS — D6959 Other secondary thrombocytopenia: Secondary | ICD-10-CM | POA: Diagnosis present

## 2013-12-22 DIAGNOSIS — R188 Other ascites: Secondary | ICD-10-CM | POA: Diagnosis present

## 2013-12-22 DIAGNOSIS — F329 Major depressive disorder, single episode, unspecified: Secondary | ICD-10-CM | POA: Diagnosis present

## 2013-12-22 DIAGNOSIS — D638 Anemia in other chronic diseases classified elsewhere: Secondary | ICD-10-CM | POA: Diagnosis present

## 2013-12-22 DIAGNOSIS — H3530 Unspecified macular degeneration: Secondary | ICD-10-CM | POA: Diagnosis present

## 2013-12-22 DIAGNOSIS — Z87891 Personal history of nicotine dependence: Secondary | ICD-10-CM

## 2013-12-22 DIAGNOSIS — E1069 Type 1 diabetes mellitus with other specified complication: Secondary | ICD-10-CM | POA: Diagnosis present

## 2013-12-22 DIAGNOSIS — J449 Chronic obstructive pulmonary disease, unspecified: Secondary | ICD-10-CM | POA: Diagnosis present

## 2013-12-22 DIAGNOSIS — R140 Abdominal distension (gaseous): Secondary | ICD-10-CM | POA: Diagnosis present

## 2013-12-22 DIAGNOSIS — I10 Essential (primary) hypertension: Secondary | ICD-10-CM | POA: Diagnosis present

## 2013-12-22 DIAGNOSIS — Z794 Long term (current) use of insulin: Secondary | ICD-10-CM

## 2013-12-22 DIAGNOSIS — K769 Liver disease, unspecified: Secondary | ICD-10-CM

## 2013-12-22 DIAGNOSIS — F32A Depression, unspecified: Secondary | ICD-10-CM | POA: Diagnosis present

## 2013-12-22 DIAGNOSIS — Z9889 Other specified postprocedural states: Secondary | ICD-10-CM

## 2013-12-22 LAB — CBC WITH DIFFERENTIAL/PLATELET
BASOS PCT: 0 % (ref 0–1)
Basophils Absolute: 0 10*3/uL (ref 0.0–0.1)
EOS ABS: 0.2 10*3/uL (ref 0.0–0.7)
Eosinophils Relative: 2 % (ref 0–5)
HCT: 42.6 % (ref 36.0–46.0)
Hemoglobin: 14.9 g/dL (ref 12.0–15.0)
Lymphocytes Relative: 30 % (ref 12–46)
Lymphs Abs: 2.6 10*3/uL (ref 0.7–4.0)
MCH: 35.7 pg — AB (ref 26.0–34.0)
MCHC: 35 g/dL (ref 30.0–36.0)
MCV: 102.2 fL — AB (ref 78.0–100.0)
Monocytes Absolute: 0.9 10*3/uL (ref 0.1–1.0)
Monocytes Relative: 11 % (ref 3–12)
NEUTROS PCT: 57 % (ref 43–77)
Neutro Abs: 5.1 10*3/uL (ref 1.7–7.7)
PLATELETS: 124 10*3/uL — AB (ref 150–400)
RBC: 4.17 MIL/uL (ref 3.87–5.11)
RDW: 15.1 % (ref 11.5–15.5)
WBC: 8.9 10*3/uL (ref 4.0–10.5)

## 2013-12-22 LAB — COMPREHENSIVE METABOLIC PANEL
ALT: 36 U/L — AB (ref 0–35)
ANION GAP: 10 (ref 5–15)
AST: 72 U/L — ABNORMAL HIGH (ref 0–37)
Albumin: 2.3 g/dL — ABNORMAL LOW (ref 3.5–5.2)
Alkaline Phosphatase: 109 U/L (ref 39–117)
BUN: 12 mg/dL (ref 6–23)
CALCIUM: 8.6 mg/dL (ref 8.4–10.5)
CO2: 23 mEq/L (ref 19–32)
Chloride: 102 mEq/L (ref 96–112)
Creatinine, Ser: 0.44 mg/dL — ABNORMAL LOW (ref 0.50–1.10)
GFR calc non Af Amer: >90 mL/min (ref >90)
GLUCOSE: 91 mg/dL (ref 70–99)
POTASSIUM: 4.8 meq/L (ref 3.7–5.3)
SODIUM: 135 meq/L — AB (ref 137–147)
TOTAL PROTEIN: 7.8 g/dL (ref 6.0–8.3)
Total Bilirubin: 3.5 mg/dL — ABNORMAL HIGH (ref 0.3–1.2)

## 2013-12-22 LAB — BODY FLUID CULTURE: CULTURE: NO GROWTH

## 2013-12-22 LAB — GLUCOSE, CAPILLARY
Glucose-Capillary: 52 mg/dL — ABNORMAL LOW (ref 70–99)
Glucose-Capillary: 89 mg/dL (ref 70–99)

## 2013-12-22 LAB — CBG MONITORING, ED: Glucose-Capillary: 65 mg/dL — ABNORMAL LOW (ref 70–99)

## 2013-12-22 MED ORDER — SODIUM CHLORIDE 0.9 % IJ SOLN
3.0000 mL | Freq: Two times a day (BID) | INTRAMUSCULAR | Status: DC
  Administered 2013-12-22 – 2013-12-31 (×17): 3 mL via INTRAVENOUS

## 2013-12-22 MED ORDER — RIFAXIMIN 550 MG PO TABS
550.0000 mg | ORAL_TABLET | Freq: Two times a day (BID) | ORAL | Status: DC
  Administered 2013-12-22 – 2013-12-31 (×18): 550 mg via ORAL
  Filled 2013-12-22 (×19): qty 1

## 2013-12-22 MED ORDER — SODIUM CHLORIDE 0.9 % IV SOLN: 250.0000 mL | INTRAVENOUS | Status: DC | PRN

## 2013-12-22 MED ORDER — ASPIRIN EC 81 MG PO TBEC
81.0000 mg | DELAYED_RELEASE_TABLET | Freq: Every morning | ORAL | Status: DC
  Administered 2013-12-23 – 2013-12-31 (×9): 81 mg via ORAL
  Filled 2013-12-22 (×9): qty 1

## 2013-12-22 MED ORDER — DIPHENHYDRAMINE HCL 25 MG PO CAPS: 25.0000 mg | ORAL_CAPSULE | Freq: Four times a day (QID) | ORAL | Status: DC | PRN

## 2013-12-22 MED ORDER — CHOLECALCIFEROL 10 MCG (400 UNIT) PO TABS
400.0000 [IU] | ORAL_TABLET | Freq: Every morning | ORAL | Status: DC
  Administered 2013-12-23 – 2013-12-31 (×9): 400 [IU] via ORAL
  Filled 2013-12-22 (×9): qty 1

## 2013-12-22 MED ORDER — DOCUSATE SODIUM 100 MG PO CAPS
100.0000 mg | ORAL_CAPSULE | Freq: Two times a day (BID) | ORAL | Status: DC
  Administered 2013-12-22 – 2013-12-31 (×18): 100 mg via ORAL
  Filled 2013-12-22 (×19): qty 1

## 2013-12-22 MED ORDER — SPIRONOLACTONE 50 MG PO TABS
50.0000 mg | ORAL_TABLET | Freq: Every morning | ORAL | Status: DC
  Administered 2013-12-23: 50 mg via ORAL
  Filled 2013-12-22: qty 1

## 2013-12-22 MED ORDER — LACTULOSE 10 GM/15ML PO SOLN
20.0000 g | Freq: Three times a day (TID) | ORAL | Status: DC | PRN
  Administered 2013-12-24 (×2): 20 g via ORAL
  Filled 2013-12-22: qty 30

## 2013-12-22 MED ORDER — PANTOPRAZOLE SODIUM 40 MG PO TBEC
40.0000 mg | DELAYED_RELEASE_TABLET | Freq: Every day | ORAL | Status: DC
  Administered 2013-12-23 – 2013-12-31 (×9): 40 mg via ORAL
  Filled 2013-12-22 (×9): qty 1

## 2013-12-22 MED ORDER — INSULIN DETEMIR 100 UNIT/ML ~~LOC~~ SOLN
80.0000 [IU] | Freq: Every morning | SUBCUTANEOUS | Status: DC
  Administered 2013-12-23: 80 [IU] via SUBCUTANEOUS
  Filled 2013-12-22: qty 0.8

## 2013-12-22 MED ORDER — SODIUM CHLORIDE 0.9 % IJ SOLN: 3.0000 mL | INTRAMUSCULAR | Status: DC | PRN

## 2013-12-22 MED ORDER — PROPRANOLOL HCL 10 MG PO TABS
10.0000 mg | ORAL_TABLET | Freq: Two times a day (BID) | ORAL | Status: DC
  Administered 2013-12-22 – 2013-12-30 (×15): 10 mg via ORAL
  Filled 2013-12-22 (×19): qty 1

## 2013-12-22 MED ORDER — DULOXETINE HCL 60 MG PO CPEP
60.0000 mg | ORAL_CAPSULE | Freq: Every morning | ORAL | Status: DC
  Administered 2013-12-23 – 2013-12-31 (×9): 60 mg via ORAL
  Filled 2013-12-22 (×9): qty 1

## 2013-12-22 MED ORDER — ONDANSETRON HCL 4 MG/2ML IJ SOLN
4.0000 mg | Freq: Four times a day (QID) | INTRAMUSCULAR | Status: DC | PRN
  Administered 2013-12-26: 4 mg via INTRAVENOUS
  Filled 2013-12-22: qty 2

## 2013-12-22 MED ORDER — ENOXAPARIN SODIUM 40 MG/0.4ML ~~LOC~~ SOLN
40.0000 mg | SUBCUTANEOUS | Status: DC
  Administered 2013-12-22: 40 mg via SUBCUTANEOUS
  Filled 2013-12-22 (×2): qty 0.4

## 2013-12-22 MED ORDER — GABAPENTIN 300 MG PO CAPS
300.0000 mg | ORAL_CAPSULE | Freq: Every day | ORAL | Status: DC
  Administered 2013-12-22 – 2013-12-30 (×9): 300 mg via ORAL
  Filled 2013-12-22 (×10): qty 1

## 2013-12-22 MED ORDER — ONDANSETRON HCL 4 MG PO TABS
4.0000 mg | ORAL_TABLET | Freq: Four times a day (QID) | ORAL | Status: DC | PRN
  Filled 2013-12-22: qty 1

## 2013-12-22 MED ORDER — MORPHINE SULFATE 2 MG/ML IJ SOLN
1.0000 mg | INTRAMUSCULAR | Status: DC | PRN
  Administered 2013-12-22 – 2013-12-26 (×4): 1 mg via INTRAVENOUS
  Filled 2013-12-22 (×5): qty 1

## 2013-12-22 MED ORDER — IOHEXOL 300 MG/ML  SOLN
80.0000 mL | Freq: Once | INTRAMUSCULAR | Status: AC | PRN
  Administered 2013-12-22: 80 mL via INTRAVENOUS

## 2013-12-22 MED ORDER — EZETIMIBE 10 MG PO TABS
10.0000 mg | ORAL_TABLET | Freq: Every day | ORAL | Status: DC
  Administered 2013-12-22 – 2013-12-30 (×9): 10 mg via ORAL
  Filled 2013-12-22 (×10): qty 1

## 2013-12-22 MED ORDER — FERROUS SULFATE 325 (65 FE) MG PO TABS
325.0000 mg | ORAL_TABLET | Freq: Two times a day (BID) | ORAL | Status: DC
  Administered 2013-12-22 – 2013-12-31 (×18): 325 mg via ORAL
  Filled 2013-12-22 (×19): qty 1

## 2013-12-22 MED ORDER — LEVOFLOXACIN 750 MG PO TABS
750.0000 mg | ORAL_TABLET | Freq: Every day | ORAL | Status: AC
  Administered 2013-12-23: 750 mg via ORAL
  Filled 2013-12-22: qty 1

## 2013-12-22 MED ORDER — FUROSEMIDE 20 MG PO TABS
20.0000 mg | ORAL_TABLET | Freq: Every day | ORAL | Status: DC
  Administered 2013-12-23: 20 mg via ORAL
  Filled 2013-12-22: qty 1

## 2013-12-22 NOTE — Progress Notes (Signed)
Hypoglycemic Event  CBG: 52 at 1743  Treatment: 15 GM carbohydrate snack Patient given 4oz. OJ and graham crackers  Symptoms: None  Follow-up CBG: Time:1807 CBG Result:89  Possible Reasons for Event: Inadequate meal intake  Comments/MD notified:    Rondel JumboDumas, Forest Redwine S  Remember to initiate Hypoglycemia Order Set & complete

## 2013-12-22 NOTE — ED Notes (Signed)
Per pt, states she was just D/C ed from hospital for ascites, on transplant list-distention started yesterday-increased SOB

## 2013-12-22 NOTE — ED Notes (Signed)
MD at bedside. 

## 2013-12-22 NOTE — ED Notes (Signed)
CT CHEST RESULTS PENDING

## 2013-12-22 NOTE — ED Notes (Signed)
MD at bedside. ADMITTING MD PRESENT TO EVALUATE PT 

## 2013-12-22 NOTE — H&P (Signed)
PCP:   [name], MD   Chief Complaint:  Shortness of breath  HPI: 58 year old female  who   has a past medical history of Hypertension; Diabetes mellitus; Hyperlipidemia; Fatigue; Night sweats; Wears dentures; Poor appetite; Retinopathy; Arthritis; Rash; Bruises easily; Colon polyp; Hernia; Rectal bleeding; Hemorrhoid; Family history of breast cancer; Abdominal pain; Shortness of breath; Night sweats; Hepatic encephalopathy; Non-alcoholic fatty liver disease; Liver cirrhosis; GERD (gastroesophageal reflux disease); Depression; Anxiety; COPD (chronic obstructive pulmonary disease); Anemia; IBS (irritable bowel syndrome); RLS (restless legs syndrome); Macular degeneration; Memory loss; Degenerative arthritis; Diabetes 1.5, managed as type 1; and Neuropathy, peripheral. Patient was recently discharged from the hospital on 12/19/2013 after patient underwent paracentesis as well as thoracentesis for ascites as well as left pleural effusion. Patient was thought to have pneumonia and was discharged on Levaquin, patient says that she was fine for 2 days and then today again she started having shortness of breath on exertion. She denies coughing, no fever, no nausea vomiting or diarrhea. Patient has a history of Elita Boone, and is currently on liver transplant list at [facility]. Chest x-ray in the ED shows recurrent left-sided pleural effusion,  (pleural fluid analysis from 12/19/1948 showed cloudy appearance, 840 WBC. At review showed reactive mesothelial cells, the pleural fluid culture showed no growth.)  Allergies:   Allergies  Allergen Reactions  . Latex Rash  . Actos [Pioglitazone] Swelling and Other (See Comments)    edema Edema   . Byetta 10 Mcg Pen [Exenatide] Other (See Comments)    Stomach upset  . Crestor [Rosuvastatin] Other (See Comments)    Other reaction(s): Liver Disorder Elevated LFT's Increases LFT  . Lipitor [Atorvastatin] Other (See Comments)    Other reaction(s): Muscle  Pain Muscle aches  . Other Other (See Comments)    Stomach upset  . Penicillin G Hives  . Penicillins Hives and Itching    All over the body.      Past Medical History  Diagnosis Date  . Hypertension   . Diabetes mellitus   . Hyperlipidemia   . Fatigue   . Night sweats   . Wears dentures   . Poor appetite   . Retinopathy   . Arthritis   . Rash   . Bruises easily   . Colon polyp   . Hernia   . Rectal bleeding   . Hemorrhoid   . Family history of breast cancer   . Abdominal pain     spasms radiating around to the right side of abdomen   . Shortness of breath   . Night sweats   . Hepatic encephalopathy   . Non-alcoholic fatty liver disease   . Liver cirrhosis     nonalcoholic  . GERD (gastroesophageal reflux disease)   . Depression   . Anxiety   . COPD (chronic obstructive pulmonary disease)     pt denies this  . Anemia   . IBS (irritable bowel syndrome)   . RLS (restless legs syndrome)   . Macular degeneration   . Memory loss     hepatatic encephlopathy  . Degenerative arthritis   . Diabetes 1.5, managed as type 1   . Neuropathy, peripheral     feet legs    Past Surgical History  Procedure Laterality Date  . Cholecystectomy  11/28/10  . Liver biopsy  Sep 2012  . Hernia repair      belly button  . Esophagogastroduodenoscopy  07/16/2011    Procedure: ESOPHAGOGASTRODUODENOSCOPY (EGD);  Surgeon: Willis Modena, MD;  Location: [facility];  Service: Endoscopy;  Laterality: N/A;  . Cesarean section      Prior to Admission medications   Medication Sig Start Date End Date Taking? Authorizing Provider  aspirin EC 81 MG tablet Take 81 mg by mouth every morning.   Yes Historical Provider, MD  cholecalciferol (VITAMIN D) 400 UNITS TABS tablet Take 400 Units by mouth every morning.   Yes Historical Provider, MD  clobetasol (OLUX) 0.05 % topical foam Apply 1 application topically 2 (two) times daily.  02/15/13  Yes Historical Provider, MD  clobetasol cream (TEMOVATE)  0.05 % Apply 1 application topically 2 (two) times daily.   Yes Historical Provider, MD  colesevelam (WELCHOL) 625 MG tablet Take 1,875 mg by mouth 2 (two) times daily. Three pills taken per dosage. Total of six per day.   Yes Historical Provider, MD  diphenhydrAMINE (BENADRYL) 25 MG tablet Take 25 mg by mouth every 6 (six) hours as needed for itching (itching).   Yes Historical Provider, MD  DULoxetine (CYMBALTA) 60 MG capsule Take 60 mg by mouth every morning.    Yes Historical Provider, MD  Empagliflozin (JARDIANCE) 10 MG TABS Take 10 mg by mouth daily.   Yes Historical Provider, MD  esomeprazole (NEXIUM) 40 MG capsule Take 40 mg by mouth daily before breakfast.    Yes Historical Provider, MD  ezetimibe (ZETIA) 10 MG tablet Take 10 mg by mouth at bedtime.    Yes Historical Provider, MD  ferrous sulfate 325 (65 FE) MG tablet Take 325 mg by mouth 2 (two) times daily.   Yes Historical Provider, MD  fluocinonide cream (LIDEX) 0.05 % Apply 1 application topically 2 (two) times daily.   Yes Historical Provider, MD  furosemide (LASIX) 20 MG tablet Take 1 tablet (20 mg total) by mouth daily. 12/19/13  Yes Jeralyn Bennett, MD  gabapentin (NEURONTIN) 300 MG capsule Take 300 mg by mouth at bedtime.    Yes Historical Provider, MD  insulin detemir (LEVEMIR) 100 UNIT/ML injection Inject 80 Units into the skin every morning.    Yes Historical Provider, MD  insulin lispro (HUMALOG) 100 UNIT/ML injection Inject 14 Units into the skin daily with supper.    Yes Historical Provider, MD  lactulose (CHRONULAC) 10 GM/15ML solution Take 20 g by mouth 3 (three) times daily as needed for mild constipation (constipation).    Yes Historical Provider, MD  levofloxacin (LEVAQUIN) 750 MG tablet Take 1 tablet (750 mg total) by mouth daily. 12/19/13  Yes Jeralyn Bennett, MD  meloxicam (MOBIC) 7.5 MG tablet Take 7.5 mg by mouth every morning.  02/19/13  Yes Historical Provider, MD  omeprazole (PRILOSEC) 20 MG capsule Take 20 mg by  mouth daily.   Yes Historical Provider, MD  propranolol (INDERAL) 10 MG tablet Take 10 mg by mouth 2 (two) times daily.  02/20/13  Yes Historical Provider, MD  rifaximin (XIFAXAN) 550 MG TABS tablet Take 550 mg by mouth 2 (two) times daily.   Yes Historical Provider, MD  spironolactone (ALDACTONE) 50 MG tablet Take 50 mg by mouth every morning.   Yes Historical Provider, MD  zinc gluconate 50 MG tablet Take 50 mg by mouth daily.   Yes Historical Provider, MD    Social History:  reports that she quit smoking about 8 years ago. She has never used smokeless tobacco. She reports that she does not drink alcohol or use illicit drugs.  Family History  Problem Relation Age of Onset  . Cancer Mother   . Hypertension  Mother   . Hyperlipidemia Father   . Iron deficiency Sister   . Heart disease Brother   . Hyperlipidemia Brother      All the positives are listed in BOLD  Review of Systems:  HEENT: Headache, blurred vision, runny nose, sore throat Neck: Hypothyroidism, hyperthyroidism,,lymphadenopathy Chest : Shortness of breath, history of COPD, Asthma Heart : Chest pain, history of coronary arterey disease GI:  Nausea, vomiting, diarrhea, constipation, GERD GU: Dysuria, urgency, frequency of urination, hematuria Neuro: Stroke, seizures, syncope Psych: Depression, anxiety, hallucinations   Physical Exam: Blood pressure 132/46, pulse 77, temperature 98.1 F (36.7 C), temperature source Oral, resp. rate 21, SpO2 94.00%. Constitutional:   Patient is a well-developed and well-nourished *female in no acute distress and cooperative with exam. Head: Normocephalic and atraumatic Mouth: Mucus membranes moist Eyes: PERRL, EOMI, conjunctivae normal Neck: Supple, No Thyromegaly Cardiovascular: RRR, S1 normal, S2 normal Pulmonary/Chest: Decreased breath sounds at left lower lung base Abdominal: Soft. Non-tender, distended, bowel sounds are normal, no masses, organomegaly, or guarding present.    Neurological: A&O x3, Strenght is normal and symmetric bilaterally, cranial nerve II-XII are grossly intact, no focal motor deficit, sensory intact to light touch bilaterally.  Extremities : Trace edema bilaterally of the lower extremities  Labs on Admission:  Basic Metabolic Panel:  Recent Labs Lab 12/15/13 1827 12/16/13 0420 12/18/13 1100 12/19/13 0410 12/22/13 1224  NA 134* 136* 137 135* 135*  K 4.4 4.6 4.4 4.3 4.8  CL 101 103 102 103 102  CO2 26 26 26 24 23   GLUCOSE 82 140* 121* 112* 91  BUN 11 12 8 8 12   CREATININE 0.54 0.61 0.50 0.47* 0.44*  CALCIUM 8.8 8.5 8.4 8.0* 8.6  MG 2.3  --   --   --   --   PHOS 3.5  --   --   --   --    Liver Function Tests:  Recent Labs Lab 12/15/13 1827 12/16/13 0420 12/18/13 1002 12/18/13 1100 12/22/13 1224  AST 60* 57*  --  60* 72*  ALT 31 31  --  33 36*  ALKPHOS 105 104  --  108 109  BILITOT 4.5* 4.2*  --  3.3* 3.5*  PROT 7.7 7.4 7.8 7.8 7.8  ALBUMIN 2.4* 2.2*  --  2.2* 2.3*   No results found for this basename: LIPASE, AMYLASE,  in the last 168 hours No results found for this basename: AMMONIA,  in the last 168 hours CBC:  Recent Labs Lab 12/15/13 1827 12/16/13 0420 12/18/13 1100 12/19/13 0410 12/22/13 1224  WBC 8.7 8.5 6.9 7.0 8.9  NEUTROABS 5.0  --   --   --  5.1  HGB 14.3 13.9 14.6 13.8 14.9  HCT 41.5 40.3 43.1 40.7 42.6  MCV 101.2* 100.8* 102.6* 103.8* 102.2*  PLT 126* 121* 123* 123* 124*   Cardiac Enzymes: No results found for this basename: CKTOTAL, CKMB, CKMBINDEX, TROPONINI,  in the last 168 hours  BNP (last 3 results) No results found for this basename: PROBNP,  in the last 8760 hours CBG:  Recent Labs Lab 12/18/13 1210 12/18/13 1627 12/18/13 2111 12/19/13 0741 12/19/13 1131  GLUCAP 197* 146* 94 117* 156*    Radiological Exams on Admission: Dg Chest 2 View  12/22/2013   CLINICAL DATA:  Shortness of breath. Prior thoracentesis. Cirrhosis. Follow-up exam.  EXAM: CHEST  2 VIEW  COMPARISON:   12/19/2013 .  FINDINGS: Mediastinum hilar structures normal. Stable left pleural effusion. Mild bibasilar atelectasis. No pneumothorax. Heart  size normal. No acute bony abnormality.  IMPRESSION: 1. Stable left pleural effusion.  Bibasilar atelectasis. 2. No acute abnormality.   Electronically Signed   By: Maisie Fushomas  Register   On: 12/22/2013 13:03       Assessment/Plan Principal Problem:   Recurrent left pleural effusion Active Problems:   NASH (nonalcoholic steatohepatitis)   Ascites   Abdominal distension   Depression   Pleural effusion associated with hepatic disorder  Recurrent left pleural effusion ? Cause, the pleural fluid analysis showed no growth of bacteria in the culture. Patient was diagnosed with pneumonia and was   discharged on Levaquin. I will obtain CT chest to rule out underlying lung disease.   ? Community-acquired pneumonia Patient was discharged on Levaquin, we'll continue with Levaquin 750 mg by mouth daily. Will await CT chest findings, if it shows infiltrate then we'll switch her to vancomycin and cefepime for   healthcare associated pneumonia.  Nonalcoholic steatotic hepatitis Will continue patient's home regimen.  Ascites Patient had 2 paracentesis done in last one week, at this time ascites appears to be moderate. Does not require paracentesis at this time.  DVT prophylaxis Lovenox  Code status: patient is full code  Family discussion: Admission, patients condition and plan of care including tests being ordered have been discussed with the patient and her husband at bedside* who indicate understanding and agree with the plan and Code Status.   Time Spent on Admission:  60 minutes  Miasha Emmons S [facility] Pager: [phone] 12/22/2013, 2:56 PM  If 7PM-7AM, please contact night-coverage  [email or web address]  Password [email or web address]

## 2013-12-22 NOTE — Progress Notes (Signed)
Subjective:     Patient ID: Michelle FennelVicky S Galvan, female   DOB: 1956/02/26, 58 y.o.   MRN: [id number]  Diabetes   patient presents with caregiver with nail disease 1-5 both feet that she cannot cut long-term diabetes with concerns and wants to have foot examination   Review of Systems  All other systems reviewed and are negative.      Objective:   Physical Exam  Nursing note and vitals reviewed. Constitutional: She is oriented to person, place, and time.  Cardiovascular: Intact distal pulses.   Musculoskeletal: Normal range of motion.  Neurological: She is oriented to person, place, and time.  Skin: Skin is warm and dry.   neurovascular status found to be intact with muscle strength reduced and range of motion subtalar midtarsal joint reduced. Patient does have moderate depression of the arch upon weightbearing and I did note reduction of sharp dull and vibratory both feet. 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 Debrided nailbeds 1-5 both feet with no iatrogenic bleeding noted

## 2013-12-22 NOTE — Progress Notes (Signed)
Reviewed the CT chest films, patient has moderate left pleural effusion. Will consult interventional radiology for ultrasound guided thoracentesis.

## 2013-12-22 NOTE — ED Notes (Signed)
Pt given orange juice.

## 2013-12-22 NOTE — ED Provider Notes (Signed)
CSN: [id number]     Arrival date & time 12/22/13  1139 History   First MD Initiated Contact with Patient 12/22/13 1154     Chief Complaint  Patient presents with  . Shortness of Breath     (Consider location/radiation/quality/duration/timing/severity/associated sxs/prior Treatment) Patient is a 58 y.o. female presenting with shortness of breath. The history is provided by the patient and the spouse.  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 4 days prior to arrival patient had fluid removed both from her abdomen and her left long. She went home 2 days prior to arrival and yesterday started to experience sob Relieved by:  Nothing Worsened by:  Activity (Laying down) Ineffective treatments:  None tried Associated symptoms: cough   Associated symptoms: no abdominal pain, no chest pain, no diaphoresis, no fever, no headaches, no hemoptysis, no vomiting and no wheezing   Associated symptoms comment:  Cough is nonproductive. No weight gain Risk factors comment:  Liver disease   Past Medical History  Diagnosis Date  . Hypertension   . Diabetes mellitus   . Hyperlipidemia   . Fatigue   . Night sweats   . Wears dentures   . Poor appetite   . Retinopathy   . Arthritis   . Rash   . Bruises easily   . Colon polyp   . Hernia   . Rectal bleeding   . Hemorrhoid   . Family history of breast cancer   . Abdominal pain     spasms radiating around to the right side of abdomen   . Shortness of breath   . Night sweats   . Hepatic encephalopathy   . Non-alcoholic fatty liver disease   . Liver cirrhosis     nonalcoholic  . GERD (gastroesophageal reflux disease)   . Depression   . Anxiety   . COPD (chronic obstructive pulmonary disease)     pt denies this  . Anemia   . IBS  (irritable bowel syndrome)   . RLS (restless legs syndrome)   . Macular degeneration   . Memory loss     hepatatic encephlopathy  . Degenerative arthritis   . Diabetes 1.5, managed as type 1   . Neuropathy, peripheral     feet legs   Past Surgical History  Procedure Laterality Date  . Cholecystectomy  11/28/10  . Liver biopsy  Sep 2012  . Hernia repair      belly button  . Esophagogastroduodenoscopy  07/16/2011    Procedure: ESOPHAGOGASTRODUODENOSCOPY (EGD);  Surgeon: Willis Modena, MD;  Location: [facility];  Service: Endoscopy;  Laterality: N/A;  . Cesarean section     Family History  Problem Relation Age of Onset  . Cancer Mother   . Hypertension Mother   . Hyperlipidemia Father   . Iron deficiency Sister   . Heart disease Brother   . Hyperlipidemia Brother    History  Substance Use Topics  . Smoking status: Former Smoker    Quit date: 08/21/2005  . Smokeless tobacco: Never Used  . Alcohol Use: No   OB History   Grav Para Term Preterm Abortions TAB SAB Ect Mult Living                 Review of Systems  Constitutional: Negative for fever, diaphoresis and unexpected weight change.  Respiratory: Positive  for cough and shortness of breath. Negative for hemoptysis and wheezing.   Cardiovascular: Negative for chest pain.  Gastrointestinal: Negative for vomiting and abdominal pain.  Neurological: Negative for headaches.  All other systems reviewed and are negative.     Allergies  Latex; Actos; Byetta 10 mcg pen; Crestor; Lipitor; Other; Penicillin g; and Penicillins  Home Medications   Prior to Admission medications   Medication Sig Start Date End Date Taking? Authorizing Provider  aspirin EC 81 MG tablet Take 81 mg by mouth every morning.    Historical Provider, MD  cholecalciferol (VITAMIN D) 400 UNITS TABS tablet Take 400 Units by mouth every morning.    Historical Provider, MD  clobetasol (OLUX) 0.05 % topical foam Apply 1 application topically 2 (two)  times daily.  02/15/13   Historical Provider, MD  clobetasol cream (TEMOVATE) 0.05 % Apply 1 application topically 2 (two) times daily.    Historical Provider, MD  colesevelam (WELCHOL) 625 MG tablet Take 1,875 mg by mouth 2 (two) times daily. Three pills taken per dosage. Total of six per day.    Historical Provider, MD  diphenhydrAMINE (BENADRYL) 25 MG tablet Take 25 mg by mouth every 6 (six) hours as needed for itching (itching).    Historical Provider, MD  DULoxetine (CYMBALTA) 60 MG capsule Take 60 mg by mouth every morning.     Historical Provider, MD  Empagliflozin (JARDIANCE) 10 MG TABS Take 10 mg by mouth daily.    Historical Provider, MD  esomeprazole (NEXIUM) 40 MG capsule Take 40 mg by mouth daily before breakfast.     Historical Provider, MD  ezetimibe (ZETIA) 10 MG tablet Take 10 mg by mouth at bedtime.     Historical Provider, MD  ferrous sulfate 325 (65 FE) MG tablet Take 325 mg by mouth 2 (two) times daily.    Historical Provider, MD  fluocinonide cream (LIDEX) 0.05 % Apply 1 application topically 2 (two) times daily.    Historical Provider, MD  furosemide (LASIX) 20 MG tablet Take 1 tablet (20 mg total) by mouth daily. 12/19/13   Jeralyn BennettEzequiel Zamora, MD  gabapentin (NEURONTIN) 300 MG capsule Take 300 mg by mouth at bedtime.     Historical Provider, MD  insulin detemir (LEVEMIR) 100 UNIT/ML injection Inject 80 Units into the skin every morning.     Historical Provider, MD  insulin lispro (HUMALOG) 100 UNIT/ML injection Inject 14 Units into the skin daily with supper.     Historical Provider, MD  lactulose (CHRONULAC) 10 GM/15ML solution Take 20 g by mouth 3 (three) times daily as needed for mild constipation (constipation).     Historical Provider, MD  levofloxacin (LEVAQUIN) 750 MG tablet Take 1 tablet (750 mg total) by mouth daily. 12/19/13   Jeralyn BennettEzequiel Zamora, MD  meloxicam (MOBIC) 7.5 MG tablet Take 7.5 mg by mouth every morning.  02/19/13   Historical Provider, MD  omeprazole (PRILOSEC)  20 MG capsule Take 20 mg by mouth daily.    Historical Provider, MD  propranolol (INDERAL) 10 MG tablet Take 10 mg by mouth 2 (two) times daily.  02/20/13   Historical Provider, MD  rifaximin (XIFAXAN) 550 MG TABS tablet Take 550 mg by mouth 2 (two) times daily.    Historical Provider, MD  spironolactone (ALDACTONE) 50 MG tablet Take 50 mg by mouth every morning.    Historical Provider, MD  zinc gluconate 50 MG tablet Take 50 mg by mouth daily.    Historical Provider, MD   BP 132/48  Pulse 76  Temp(Src) 98.7 F (37.1 C) (Oral)  Resp 23  SpO2 92% Physical Exam  Nursing note and vitals reviewed. Constitutional: She is oriented to person, place, and time. She appears well-developed and well-nourished. No distress.  HENT:  Head: Normocephalic and atraumatic.  Mouth/Throat: Oropharynx is clear and moist.  Eyes: Conjunctivae and EOM are normal. Pupils are equal, round, and reactive to light.  Neck: Normal range of motion. Neck supple.  Cardiovascular: Normal rate, regular rhythm and intact distal pulses.   No murmur heard. Pulmonary/Chest: Tachypnea noted. No respiratory distress. She has decreased breath sounds in the left upper field, the left middle field and the left lower field. She has no wheezes. She has no rales.  Abdominal: Soft. She exhibits distension, fluid wave and ascites. There is no tenderness. There is no rebound and no guarding.  Musculoskeletal: Normal range of motion. She exhibits no edema and no tenderness.  Neurological: She is alert and oriented to person, place, and time.  Skin: Skin is warm and dry. No rash noted. No erythema.  Spider angiomata  Psychiatric: She has a normal mood and affect. Her behavior is normal.    ED Course  Procedures (including critical care time) Labs Review Labs Reviewed  CBC WITH DIFFERENTIAL - Abnormal; Notable for the following:    MCV 102.2 (*)    MCH 35.7 (*)    Platelets 124 (*)    All other components within normal limits   COMPREHENSIVE METABOLIC PANEL - Abnormal; Notable for the following:    Sodium 135 (*)    Creatinine, Ser 0.44 (*)    Albumin 2.3 (*)    AST 72 (*)    ALT 36 (*)    Total Bilirubin 3.5 (*)    All other components within normal limits    Imaging Review Dg Chest 2 View  12/22/2013   CLINICAL DATA:  Shortness of breath. Prior thoracentesis. Cirrhosis. Follow-up exam.  EXAM: CHEST  2 VIEW  COMPARISON:  12/19/2013 .  FINDINGS: Mediastinum hilar structures normal. Stable left pleural effusion. Mild bibasilar atelectasis. No pneumothorax. Heart size normal. No acute bony abnormality.  IMPRESSION: 1. Stable left pleural effusion.  Bibasilar atelectasis. 2. No acute abnormality.   Electronically Signed   By: Maisie Fus  Register   On: 12/22/2013 13:03     EKG Interpretation None      MDM   Final diagnoses:  Pleural effusion associated with hepatic disorder   Patient with history of end-stage liver disease on the transplant list she was recently hospitalized for repetitive paracentesis and one thoracentesis. Patient's last thoracentesis and paracentesis for 4 days prior to arrival. She was discharged 2 days prior to arrival and return for worsening shortness of breath feeling that the fluid had reaccumulated. She's been on a strict sodium restricted diet and fluid restriction. She denies any weight gain and is been taking all her medications as prescribed. She denies any chest pain, abdominal pain, fever, vomiting. Low suspicion for SBP, pneumonia or other infectious etiology. Feel that patient's symptoms are results of reaccumulation of pleural effusion.  CBC, CMP, chest x-ray pending patient currently is in no acute distress and has an O2 sat of 92%.  1:47 PM Labs stable.  CXR shows reaccumulation of the left pleural effusion.  Pt admitted for further care.  Gwyneth Sprout, MD 12/22/13 1348

## 2013-12-23 ENCOUNTER — Observation Stay (HOSPITAL_COMMUNITY): Payer: Managed Care, Other (non HMO)

## 2013-12-23 ENCOUNTER — Inpatient Hospital Stay (HOSPITAL_COMMUNITY): Payer: Managed Care, Other (non HMO)

## 2013-12-23 DIAGNOSIS — J948 Other specified pleural conditions: Secondary | ICD-10-CM

## 2013-12-23 DIAGNOSIS — K769 Liver disease, unspecified: Secondary | ICD-10-CM

## 2013-12-23 LAB — GLUCOSE, CAPILLARY
GLUCOSE-CAPILLARY: 76 mg/dL (ref 70–99)
Glucose-Capillary: 100 mg/dL — ABNORMAL HIGH (ref 70–99)
Glucose-Capillary: 140 mg/dL — ABNORMAL HIGH (ref 70–99)

## 2013-12-23 LAB — CBC
HEMATOCRIT: 40.4 % (ref 36.0–46.0)
Hemoglobin: 13.8 g/dL (ref 12.0–15.0)
MCH: 34.4 pg — ABNORMAL HIGH (ref 26.0–34.0)
MCHC: 34.2 g/dL (ref 30.0–36.0)
MCV: 100.7 fL — AB (ref 78.0–100.0)
Platelets: 128 10*3/uL — ABNORMAL LOW (ref 150–400)
RBC: 4.01 MIL/uL (ref 3.87–5.11)
RDW: 15.4 % (ref 11.5–15.5)
WBC: 6.9 10*3/uL (ref 4.0–10.5)

## 2013-12-23 LAB — COMPREHENSIVE METABOLIC PANEL
ALK PHOS: 98 U/L (ref 39–117)
ALT: 32 U/L (ref 0–35)
AST: 61 U/L — ABNORMAL HIGH (ref 0–37)
Albumin: 2.1 g/dL — ABNORMAL LOW (ref 3.5–5.2)
Anion gap: 10 (ref 5–15)
BILIRUBIN TOTAL: 3.6 mg/dL — AB (ref 0.3–1.2)
BUN: 11 mg/dL (ref 6–23)
CO2: 23 mEq/L (ref 19–32)
Calcium: 8.2 mg/dL — ABNORMAL LOW (ref 8.4–10.5)
Chloride: 103 mEq/L (ref 96–112)
Creatinine, Ser: 0.52 mg/dL (ref 0.50–1.10)
GFR calc non Af Amer: >90 mL/min (ref >90)
Glucose, Bld: 117 mg/dL — ABNORMAL HIGH (ref 70–99)
POTASSIUM: 4.4 meq/L (ref 3.7–5.3)
Sodium: 136 mEq/L — ABNORMAL LOW (ref 137–147)
TOTAL PROTEIN: 7 g/dL (ref 6.0–8.3)

## 2013-12-23 LAB — PROTEIN, BODY FLUID: TOTAL PROTEIN, FLUID: 1 g/dL

## 2013-12-23 LAB — LACTATE DEHYDROGENASE, PLEURAL OR PERITONEAL FLUID: LD, Fluid: 59 U/L — ABNORMAL HIGH (ref 3–23)

## 2013-12-23 LAB — LACTATE DEHYDROGENASE: LDH: 356 U/L — AB (ref 94–250)

## 2013-12-23 LAB — PROTIME-INR
INR: 1.94 — ABNORMAL HIGH (ref 0.00–1.49)
Prothrombin Time: 22.4 seconds — ABNORMAL HIGH (ref 11.6–15.2)

## 2013-12-23 LAB — ALBUMIN: Albumin: 2 g/dL — ABNORMAL LOW (ref 3.5–5.2)

## 2013-12-23 MED ORDER — FUROSEMIDE 40 MG PO TABS
40.0000 mg | ORAL_TABLET | Freq: Every day | ORAL | Status: DC
  Administered 2013-12-24 – 2013-12-25 (×2): 40 mg via ORAL
  Filled 2013-12-23 (×3): qty 1

## 2013-12-23 MED ORDER — SPIRONOLACTONE 100 MG PO TABS
100.0000 mg | ORAL_TABLET | Freq: Every morning | ORAL | Status: DC
  Administered 2013-12-24 – 2013-12-26 (×3): 100 mg via ORAL
  Filled 2013-12-23 (×4): qty 1

## 2013-12-23 MED ORDER — INSULIN ASPART 100 UNIT/ML ~~LOC~~ SOLN
0.0000 [IU] | Freq: Three times a day (TID) | SUBCUTANEOUS | Status: DC
Start: 2013-12-23 — End: 2013-12-31
  Administered 2013-12-23: 1 [IU] via SUBCUTANEOUS
  Administered 2013-12-24: 3 [IU] via SUBCUTANEOUS
  Administered 2013-12-25: 1 [IU] via SUBCUTANEOUS
  Administered 2013-12-25: 3 [IU] via SUBCUTANEOUS
  Administered 2013-12-25 – 2013-12-26 (×4): 1 [IU] via SUBCUTANEOUS
  Administered 2013-12-27: 3 [IU] via SUBCUTANEOUS
  Administered 2013-12-28 – 2013-12-29 (×3): 2 [IU] via SUBCUTANEOUS
  Administered 2013-12-29: 1 [IU] via SUBCUTANEOUS
  Administered 2013-12-30: 3 [IU] via SUBCUTANEOUS
  Administered 2013-12-31: 1 [IU] via SUBCUTANEOUS

## 2013-12-23 MED ORDER — INSULIN DETEMIR 100 UNIT/ML ~~LOC~~ SOLN
60.0000 [IU] | Freq: Every morning | SUBCUTANEOUS | Status: DC
  Administered 2013-12-24: 60 [IU] via SUBCUTANEOUS
  Filled 2013-12-23 (×2): qty 0.6

## 2013-12-23 MED ORDER — FUROSEMIDE 10 MG/ML IJ SOLN
40.0000 mg | Freq: Once | INTRAMUSCULAR | Status: AC
  Administered 2013-12-23: 40 mg via INTRAVENOUS
  Filled 2013-12-23: qty 4

## 2013-12-23 NOTE — Progress Notes (Signed)
PROGRESS NOTE  ELIS RAWLINSON ZOX:[id number] DOB: 1955/05/20 DOA: 12/22/2013 PCP: [name], MD  HPI: Patient was recently discharged from the hospital on 12/19/2013 after patient underwent paracentesis as well as thoracentesis for ascites as well as left pleural effusion. Patient was thought to have pneumonia and was discharged on Levaquin, patient says that she was fine for 2 days and then today again she started having shortness of breath on exertion. She denies coughing, no fever, no nausea vomiting or diarrhea. Patient has a history of Elita Boone, and is currently on liver transplant list at [facility].  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 Dulce Sellar, consulted today, appreciate input - also they are entertaining idea for [facility] transfer  - plan for thoracentesis today - ascites large but not tense, will hold  ?CAP - she has been on Levofloxacin for the past week, no cough/fevers sputum production. - clinically resolved, no need for further antibiotics IDDM - patient with few episodes of hypoglycemia, on Levemir 80 U daily, will decrease to 60 U daily and add SSI, appreciate diabetes educator input - Last A1C obtained last week and showed good control at 6.9 Chronic anemia - due to underlying chronic liver disease, stable Coagulopathy - due to liver disease, INR pending this admission  Chronic thrombocytopenia  - due to underlying chronic liver disease, stable  Diet: low sodium Fluids: none  DVT Prophylaxis: SCD  Code Status: Full Family Communication: d/w husband and daughter bedside  Disposition Plan: home when  ready  Consultants:  GI (Dr. Dulce Sellar)  Procedures:  None    Antibiotics  Anti-infectives   Start     Dose/Rate Route Frequency Ordered Stop   12/23/13 1000  levofloxacin (LEVAQUIN) tablet 750 mg     750 mg Oral Daily 12/22/13 1758 12/23/13 1015   12/22/13 2200  rifaximin (XIFAXAN) tablet 550 mg     550 mg Oral 2 times daily 12/22/13 1758         Studies  Filed Vitals:   12/22/13 1711 12/22/13 1750 12/22/13 2137 12/23/13 0426  BP: 107/50 118/58 107/49 121/47  Pulse: 81 71 65 77  Temp: 98.1 F (36.7 C) 98.4 F (36.9 C) 97.9 F (36.6 C) 97.8 F (36.6 C)  TempSrc: Oral Oral Oral Oral  Resp: 19 18 16 16   Height:  5\' 5"  (1.651 m)    Weight:  90.719 kg (200 lb)  90.719 kg (200 lb)  SpO2: 94% 93% 99% 98%    Intake/Output Summary (Last 24 hours) at 12/23/13 1128 Last data filed at 12/23/13 0859  Gross per 24 hour  Intake    480 ml  Output      0 ml  Net    480 ml   Filed Weights   12/22/13 1750 12/23/13 0426  Weight: 90.719 kg (200 lb) 90.719 kg (200 lb)    Exam:  General:  NAD  Cardiovascular: RRR  Respiratory: shallow breathing, moves air well, decreased breath sounds at the bases  Abdomen: distended, with evident ascites  MSK: no edema  Neuro: non focal   Data Reviewed: Basic Metabolic Panel:  Recent Labs Lab 12/18/13 1100 12/19/13 0410 12/22/13 1224 12/23/13 0353  NA 137 135* 135* 136*  K 4.4 4.3 4.8 4.4  CL 102 103 102 103  CO2 26 24 23 23   GLUCOSE 121* 112* 91 117*  BUN 8 8 12 11   CREATININE 0.50 0.47* 0.44* 0.52  CALCIUM 8.4 8.0* 8.6 8.2*   Liver Function Tests:  Recent Labs Lab 12/18/13 1002 12/18/13 1100 12/22/13 1224 12/23/13 0353  AST  --  60* 72* 61*  ALT  --  33 36* 32  ALKPHOS  --  108 109 98  BILITOT  --  3.3* 3.5* 3.6*  PROT 7.8 7.8 7.8 7.0  ALBUMIN  --  2.2* 2.3* 2.1*   CBC:  Recent Labs Lab 12/18/13 1100 12/19/13 0410 12/22/13 1224 12/23/13 0353  WBC 6.9 7.0 8.9 6.9  NEUTROABS  --   --  5.1  --   HGB  14.6 13.8 14.9 13.8  HCT 43.1 40.7 42.6 40.4  MCV 102.6* 103.8* 102.2* 100.7*  PLT 123* 123* 124* 128*   CBG:  Recent Labs Lab 12/19/13 1131 12/22/13 1528 12/22/13 1743 12/22/13 1807 12/23/13 0539  GLUCAP 156* 65* 52* 89 100*    Recent Results (from the past 240 hour(s))  BODY FLUID CULTURE     Status: None   Collection Time    12/19/13 10:12 AM      Result Value Ref Range Status   Specimen Description PLEURAL   Final   Special Requests Immunocompromised   Final   Gram Stain     Final   Value: FEW WBC PRESENT, PREDOMINANTLY PMN     NO SQUAMOUS EPITHELIAL CELLS SEEN     NO ORGANISMS SEEN     Performed at [facility]   Culture     Final   Value: NO GROWTH 3 DAYS     Performed at [facility]   Report Status 12/22/2013 FINAL   Final     Studies: Dg Chest 2 View  12/22/2013   CLINICAL DATA:  Shortness of breath. Prior thoracentesis. Cirrhosis. Follow-up exam.  EXAM: CHEST  2 VIEW  COMPARISON:  12/19/2013 .  FINDINGS: Mediastinum hilar structures normal. Stable left pleural effusion. Mild bibasilar atelectasis. No pneumothorax. Heart size normal. No acute bony abnormality.  IMPRESSION: 1. Stable left pleural effusion.  Bibasilar atelectasis. 2. No acute abnormality.   Electronically Signed   By: Maisie Fushomas  Register   On: 12/22/2013 13:03   Ct Chest W Contrast  12/22/2013   CLINICAL DATA:  Shortness of breath, fatty liver, ascites, left pleural effusion  EXAM: CT CHEST WITH CONTRAST  TECHNIQUE: Multidetector CT imaging of the chest was performed during intravenous contrast administration.  CONTRAST:  80mL OMNIPAQUE IOHEXOL 300 MG/ML  SOLN  COMPARISON:  11/19/2010  FINDINGS: The central airways are patent. There is a moderate left pleural effusion with compressive atelectasis involving the entirety of the left lower lobe. There is no right pleural effusion. There is no pneumothorax. There are mild bilateral emphysematous changes.  There are no pathologically enlarged  axillary, hilar or mediastinal lymph nodes.  The heart size is normal. There is no pericardial effusion. The thoracic aorta is normal in caliber. There is coronary artery atherosclerosis in the LAD. There is thoracic aortic atherosclerosis.  Review of bone windows demonstrates no focal lytic or sclerotic lesions.  Limited non-contrast images of the upper abdomen were obtained. The adrenal glands appear normal. There is perihepatic ascites. There is a micronodular contour of the liver concerning for cirrhosis.  IMPRESSION: 1. Moderate left pleural effusion with  compressive atelectasis involving the entirety of the left lower lobe. 2. Partially visualized liver with findings concerning for cirrhosis. Moderate perihepatic ascites.   Electronically Signed   By: Elige KoHetal  Patel   On: 12/22/2013 16:37    Dg Chest 2 View  12/22/2013   CLINICAL DATA:  Shortness of breath. Prior thoracentesis. Cirrhosis. Follow-up exam.  EXAM: CHEST  2 VIEW  COMPARISON:  12/19/2013 .  FINDINGS: Mediastinum hilar structures normal. Stable left pleural effusion. Mild bibasilar atelectasis. No pneumothorax. Heart size normal. No acute bony abnormality.  IMPRESSION: 1. Stable left pleural effusion.  Bibasilar atelectasis. 2. No acute abnormality.   Electronically Signed   By: Maisie Fushomas  Register   On: 12/22/2013 13:03   Ct Chest W Contrast  12/22/2013   CLINICAL DATA:  Shortness of breath, fatty liver, ascites, left pleural effusion  EXAM: CT CHEST WITH CONTRAST  TECHNIQUE: Multidetector CT imaging of the chest was performed during intravenous contrast administration.  CONTRAST:  80mL OMNIPAQUE IOHEXOL 300 MG/ML  SOLN  COMPARISON:  11/19/2010  FINDINGS: The central airways are patent. There is a moderate left pleural effusion with compressive atelectasis involving the entirety of the left lower lobe. There is no right pleural effusion. There is no pneumothorax. There are mild bilateral emphysematous changes.  There are no pathologically  enlarged axillary, hilar or mediastinal lymph nodes.  The heart size is normal. There is no pericardial effusion. The thoracic aorta is normal in caliber. There is coronary artery atherosclerosis in the LAD. There is thoracic aortic atherosclerosis.  Review of bone windows demonstrates no focal lytic or sclerotic lesions.  Limited non-contrast images of the upper abdomen were obtained. The adrenal glands appear normal. There is perihepatic ascites. There is a micronodular contour of the liver concerning for cirrhosis.  IMPRESSION: 1. Moderate left pleural effusion with compressive atelectasis involving the entirety of the left lower lobe. 2. Partially visualized liver with findings concerning for cirrhosis. Moderate perihepatic ascites.   Electronically Signed   By: Elige KoHetal  Patel   On: 12/22/2013 16:37    Scheduled Meds: . aspirin EC  81 mg Oral q morning - 10a  . cholecalciferol  400 Units Oral q morning - 10a  . docusate sodium  100 mg Oral BID  . DULoxetine  60 mg Oral q morning - 10a  . enoxaparin (LOVENOX) injection  40 mg Subcutaneous Q24H  . ezetimibe  10 mg Oral QHS  . ferrous sulfate  325 mg Oral BID  . furosemide  20 mg Oral Daily  . gabapentin  300 mg Oral QHS  . insulin detemir  80 Units Subcutaneous q morning - 10a  . pantoprazole  40 mg Oral Daily  . propranolol  10 mg Oral BID  . rifaximin  550 mg Oral BID  . sodium chloride  3 mL Intravenous Q12H  . spironolactone  50 mg Oral q morning - 10a   Continuous Infusions:   Principal Problem:   Recurrent left pleural effusion Active Problems:   NASH (nonalcoholic steatohepatitis)   Ascites   Abdominal distension   Depression   Pleural effusion associated with hepatic disorder  Time spent: 25  Pamella Pertostin Gherghe, MD [facility] Pager [phone]. If 7 PM - 7 AM, please contact night-coverage at [email or web address], password [email or web address] 12/23/2013, 11:28 AM  LOS: 1 day

## 2013-12-23 NOTE — Progress Notes (Signed)
Inpatient Diabetes Program Recommendations  AACE/ADA: New Consensus Statement on Inpatient Glycemic Control (2013)  Target Ranges:  Prepandial:   less than 140 mg/dL      Peak postprandial:   less than 180 mg/dL (1-2 hours)      Critically ill patients:  140 - 180 mg/dL     Results for [name], [name] (MRN [id number]) as of 12/23/2013 07:26  Ref. Range 12/22/2013 15:28 12/22/2013 17:43 12/22/2013 18:07  Glucose-Capillary Latest Range: 70-99 mg/dL 65 (L) 52 (L) 89    Results for [name], [name] (MRN [id number]) as of 12/23/2013 07:26  Ref. Range 12/23/2013 05:39  Glucose-Capillary Latest Range: 70-99 mg/dL 725100 (H)     Admitted with recurrent pleural effusion.  Hypoglycemic on admission as well.  Home DM Meds: Levemir 80 units QAM       Humalog 14 units with supper       Jardiance 10 mg daily   Current Insulin Orders: Levemir 80 units QAM     MD- Since patient was Hypoglycemic on admission, please consider the following:  1. Decrease Levemir dose to 1/2 home dose and titrate upward as needed- Levemir 40 units QAM 2. Add Novolog Sensitive SSI tid ac + HS    Will follow Ambrose FinlandJeannine Johnston Christion Leonhard RN, MSN, CDE Diabetes Coordinator Inpatient Diabetes Program Team Pager: [phone] (8a-10p)

## 2013-12-23 NOTE — Care Management Note (Signed)
CARE MANAGEMENT NOTE 12/23/2013  Patient:  [name],[name]   Account Number:  [id number]  Date Initiated:  12/23/2013  Documentation initiated by:  Sandford CrazeLEMENTS,Vignesh Willert  Subjective/Objective Assessment:   58 yo admitted for SOB.  Hx of recurrent Pleural Effusions, NASH, [facility]     Action/Plan:   From home with spouse   Anticipated DC Date:  12/25/2013   Anticipated DC Plan:  HOME/SELF CARE      DC Planning Services  CM consult      Choice offered to / List presented to:             Status of service:  In process, will continue to follow Medicare Important Message given?   (If response is "NO", the following Medicare IM given date fields will be blank) Date Medicare IM given:   Medicare IM given by:   Date Additional Medicare IM given:   Additional Medicare IM given by:    Discharge Disposition:    Per UR Regulation:  Reviewed for med. necessity/level of care/duration of stay  If discussed at Long Length of Stay Meetings, dates discussed:    Comments:  12/23/13 Sandford CrazeNora Patrizia Paule RN,BSN,NCM Monitoring for DC needs.  CM will continue to follow.

## 2013-12-23 NOTE — Procedures (Signed)
Successful US guided left thoracentesis. Yielded 1.6 liters of serous/amber fluid. Pt tolerated procedure well. No immediate complications.  Specimen was sent for labs. CXR ordered.  Pattricia BossMORGAN, Wynne Jury D PA-C 12/23/2013 1:58 PM

## 2013-12-23 NOTE — Progress Notes (Signed)
Subjective: Readmitted for worsening shortness of breath, and worsening abdominal distention. Had thoracentesis 1.6 liters left pleural effusion.  Objective: Vital signs in last 24 hours: Temp:  [97.8 F (36.6 C)-98.5 F (36.9 C)] 98.5 F (36.9 C) (10/09 1431) Pulse Rate:  [65-81] 71 (10/09 1431) Resp:  [16-19] 16 (10/09 1431) BP: (94-121)/(28-58) 109/43 mmHg (10/09 1431) SpO2:  [93 %-99 %] 95 % (10/09 1431) Weight:  [90.719 kg (200 lb)] 90.719 kg (200 lb) (10/09 0426) Weight change:  Last BM Date:  (PTA)  PE: GEN:  Chronically ill-appearing but not overtly toxic-appearing, somnolent (sedated for thoracentesis) but arousable SKIN:  Scattered telangiectasias and ecchymoses ABD:  Soft, moderate distention, non tender Lungs:  Diminished aeration left base  Lab Results: CBC    Component Value Date/Time   WBC 6.9 12/23/2013 0353   RBC 4.01 12/23/2013 0353   HGB 13.8 12/23/2013 0353   HCT 40.4 12/23/2013 0353   PLT 128* 12/23/2013 0353   MCV 100.7* 12/23/2013 0353   MCH 34.4* 12/23/2013 0353   MCHC 34.2 12/23/2013 0353   RDW 15.4 12/23/2013 0353   LYMPHSABS 2.6 12/22/2013 1224   MONOABS 0.9 12/22/2013 1224   EOSABS 0.2 12/22/2013 1224   BASOSABS 0.0 12/22/2013 1224   CMP     Component Value Date/Time   NA 136* 12/23/2013 0353   K 4.4 12/23/2013 0353   CL 103 12/23/2013 0353   CO2 23 12/23/2013 0353   GLUCOSE 117* 12/23/2013 0353   BUN 11 12/23/2013 0353   CREATININE 0.52 12/23/2013 0353   CALCIUM 8.2* 12/23/2013 0353   PROT 7.0 12/23/2013 0353   ALBUMIN 2.0* 12/23/2013 1200   AST 61* 12/23/2013 0353   ALT 32 12/23/2013 0353   ALKPHOS 98 12/23/2013 0353   BILITOT 3.6* 12/23/2013 0353   GFRNONAA >90 12/23/2013 0353   GFRAA >90 12/23/2013 0353   INR 1.94  Assessment:  1.  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  MELD currently 19, unchanged from prior admission.  Plan:  1.  Increase diuretics (furosemide 20--> 40 mg po qd;  spironolactone 50 -->100 mg po qd) orally. 2.  Administer as-needed intravenous furosemide as well, watching renal function closely. 3.  I have been in communication with Dr. Sharlene MottsBerg, [facility]; if she shows any signs of deterioration, [facility], but, at the present time, we will continue the current medical management here.   Freddy JakschUTLAW,Elizabeht Suto M 12/23/2013, 4:04 PM

## 2013-12-24 LAB — CBC
HEMATOCRIT: 41.2 % (ref 36.0–46.0)
HEMOGLOBIN: 14.1 g/dL (ref 12.0–15.0)
MCH: 35.3 pg — AB (ref 26.0–34.0)
MCHC: 34.2 g/dL (ref 30.0–36.0)
MCV: 103 fL — AB (ref 78.0–100.0)
Platelets: 110 10*3/uL — ABNORMAL LOW (ref 150–400)
RBC: 4 MIL/uL (ref 3.87–5.11)
RDW: 15.3 % (ref 11.5–15.5)
WBC: 7.4 10*3/uL (ref 4.0–10.5)

## 2013-12-24 LAB — COMPREHENSIVE METABOLIC PANEL
ALT: 31 U/L (ref 0–35)
ANION GAP: 9 (ref 5–15)
AST: 58 U/L — AB (ref 0–37)
Albumin: 1.9 g/dL — ABNORMAL LOW (ref 3.5–5.2)
Alkaline Phosphatase: 105 U/L (ref 39–117)
BUN: 10 mg/dL (ref 6–23)
CO2: 27 mEq/L (ref 19–32)
Calcium: 8.2 mg/dL — ABNORMAL LOW (ref 8.4–10.5)
Chloride: 101 mEq/L (ref 96–112)
Creatinine, Ser: 0.53 mg/dL (ref 0.50–1.10)
GFR calc Af Amer: >90 mL/min (ref >90)
GFR calc non Af Amer: >90 mL/min (ref >90)
Glucose, Bld: 106 mg/dL — ABNORMAL HIGH (ref 70–99)
Potassium: 3.9 mEq/L (ref 3.7–5.3)
SODIUM: 137 meq/L (ref 137–147)
TOTAL PROTEIN: 7 g/dL (ref 6.0–8.3)
Total Bilirubin: 2.6 mg/dL — ABNORMAL HIGH (ref 0.3–1.2)

## 2013-12-24 LAB — PH, BODY FLUID: pH, Fluid: 8

## 2013-12-24 LAB — GLUCOSE, CAPILLARY
GLUCOSE-CAPILLARY: 209 mg/dL — AB (ref 70–99)
GLUCOSE-CAPILLARY: 92 mg/dL (ref 70–99)
Glucose-Capillary: 118 mg/dL — ABNORMAL HIGH (ref 70–99)
Glucose-Capillary: 194 mg/dL — ABNORMAL HIGH (ref 70–99)

## 2013-12-24 LAB — PHOSPHORUS: Phosphorus: 2.7 mg/dL (ref 2.3–4.6)

## 2013-12-24 LAB — MAGNESIUM: Magnesium: 2 mg/dL (ref 1.5–2.5)

## 2013-12-24 MED ORDER — FLEET ENEMA 7-19 GM/118ML RE ENEM
1.0000 | ENEMA | Freq: Every day | RECTAL | Status: DC | PRN
  Administered 2013-12-24 – 2013-12-28 (×2): 1 via RECTAL
  Filled 2013-12-24 (×2): qty 1

## 2013-12-24 MED ORDER — ZOLPIDEM TARTRATE 5 MG PO TABS
5.0000 mg | ORAL_TABLET | Freq: Every evening | ORAL | Status: DC | PRN
  Administered 2013-12-24 – 2013-12-29 (×5): 5 mg via ORAL
  Filled 2013-12-24 (×6): qty 1

## 2013-12-24 MED ORDER — FUROSEMIDE 10 MG/ML IJ SOLN
40.0000 mg | Freq: Once | INTRAMUSCULAR | Status: AC
  Administered 2013-12-24: 40 mg via INTRAVENOUS
  Filled 2013-12-24: qty 4

## 2013-12-24 NOTE — Progress Notes (Signed)
MD notified regarding trend in patient's BP with low diastolic readings. Per MD, RN to continue to administer diuretics and propranolol as ordered and monitor pt for symptomatic response to BP.

## 2013-12-24 NOTE — Progress Notes (Signed)
UR completed 

## 2013-12-24 NOTE — Progress Notes (Signed)
PROGRESS NOTE  [name] ZOX:[id number] DOB: 1955-07-29 DOA: 12/22/2013 PCP: [name], MD  HPI: Patient was recently discharged from the hospital on 12/19/2013 after patient underwent paracentesis as well as thoracentesis for ascites as well as left pleural effusion. Patient was thought to have pneumonia and was discharged on Levaquin, patient says that she was fine for 2 days and then today again she started having shortness of breath on exertion. She denies coughing, no fever, no nausea vomiting or diarrhea. Patient has a history of Elita Boone, and is currently on liver transplant list at [facility]  Subjective/ 24 H Interval events - breathing better at rest, s/p thoracentesis yesterday   Assessment/Plan: NASH cirrhosis with evidence of decompensation with refractory ascites with possible hydrothorax - patient with evidence of decompensation with worsening ascites  - ascites large, not tense - needing IV diuresis s/p IV Lasix on 10/9 and repeat today 10/10 - monitor I&O, daily weights - incentive spirometry - mobilize more today  - low sodium diet and 1500 cc fluid restriction ?CAP - she has been on Levofloxacin for the past week, no cough/fevers sputum production. - clinically resolved, no need for further antibiotics IDDM - patient with few episodes of hypoglycemia, on Levemir 80 U daily, will decrease to 60 U daily and add SSI, appreciate diabetes educator input - Last A1C obtained last week and showed good control at 6.9 Chronic anemia - due to underlying chronic liver disease, stable Coagulopathy - due to liver disease, INR stable Chronic thrombocytopenia  - due to underlying chronic liver disease, stable  Diet: low sodium Fluids: none  DVT Prophylaxis: SCD  Code Status: Full Family Communication: d/w husband and daughter bedside  Disposition Plan: home when ready, continues to need IV diuresis for now  Consultants:  GI (Dr. Dulce Sellar)  Procedures:  None     Antibiotics  Anti-infectives   Start     Dose/Rate Route Frequency Ordered Stop   12/23/13 1000  levofloxacin (LEVAQUIN) tablet 750 mg     750 mg Oral Daily 12/22/13 1758 12/23/13 1015   12/22/13 2200  rifaximin (XIFAXAN) tablet 550 mg     550 mg Oral 2 times daily 12/22/13 1758       Studies  Filed Vitals:   12/23/13 1431 12/23/13 2050 12/23/13 2112 12/24/13 0538  BP: 109/43 100/40 100/48 112/40  Pulse: 71 72  71  Temp: 98.5 F (36.9 C) 98.4 F (36.9 C)  97.9 F (36.6 C)  TempSrc: Oral Oral  Oral  Resp: 16 16  18   Height:      Weight:      SpO2: 95% 94%  95%    Intake/Output Summary (Last 24 hours) at 12/24/13 1134 Last data filed at 12/24/13 1003  Gross per 24 hour  Intake    243 ml  Output      0 ml  Net    243 ml   Filed Weights   12/22/13 1750 12/23/13 0426  Weight: 90.719 kg (200 lb) 90.719 kg (200 lb)   Exam:  General:  NAD  Cardiovascular: RRR  Respiratory: shallow breathing, moves air well, decreased breath sounds at the bases  Abdomen: distended, with evident ascites  MSK: no edema  Neuro: non focal   Data Reviewed: Basic Metabolic Panel:  Recent Labs Lab 12/18/13 1100 12/19/13 0410 12/22/13 1224 12/23/13 0353 12/24/13 0405  NA 137 135* 135* 136* 137  K 4.4 4.3 4.8 4.4 3.9  CL 102 103 102 103 101  CO2 26  24 23 23 27   GLUCOSE 121* 112* 91 117* 106*  BUN 8 8 12 11 10   CREATININE 0.50 0.47* 0.44* 0.52 0.53  CALCIUM 8.4 8.0* 8.6 8.2* 8.2*  MG  --   --   --   --  2.0  PHOS  --   --   --   --  2.7   Liver Function Tests:  Recent Labs Lab 12/18/13 1002 12/18/13 1100 12/22/13 1224 12/23/13 0353 12/23/13 1200 12/24/13 0405  AST  --  60* 72* 61*  --  58*  ALT  --  33 36* 32  --  31  ALKPHOS  --  108 109 98  --  105  BILITOT  --  3.3* 3.5* 3.6*  --  2.6*  PROT 7.8 7.8 7.8 7.0  --  7.0  ALBUMIN  --  2.2* 2.3* 2.1* 2.0* 1.9*   CBC:  Recent Labs Lab 12/18/13 1100 12/19/13 0410 12/22/13 1224 12/23/13 0353 12/24/13 0405   WBC 6.9 7.0 8.9 6.9 7.4  NEUTROABS  --   --  5.1  --   --   HGB 14.6 13.8 14.9 13.8 14.1  HCT 43.1 40.7 42.6 40.4 41.2  MCV 102.6* 103.8* 102.2* 100.7* 103.0*  PLT 123* 123* 124* 128* 110*   CBG:  Recent Labs Lab 12/22/13 1807 12/23/13 0539 12/23/13 1741 12/23/13 2141 12/24/13 0741  GLUCAP 89 100* 140* 76 118*    Recent Results (from the past 240 hour(s))  BODY FLUID CULTURE     Status: None   Collection Time    12/19/13 10:12 AM      Result Value Ref Range Status   Specimen Description PLEURAL   Final   Special Requests Immunocompromised   Final   Gram Stain     Final   Value: FEW WBC PRESENT, PREDOMINANTLY PMN     NO SQUAMOUS EPITHELIAL CELLS SEEN     NO ORGANISMS SEEN     Performed at [facility]   Culture     Final   Value: NO GROWTH 3 DAYS     Performed at [facility]   Report Status 12/22/2013 FINAL   Final  BODY FLUID CULTURE     Status: None   Collection Time    12/23/13  1:39 PM      Result Value Ref Range Status   Specimen Description PLEURAL   Final   Special Requests NONE   Final   Gram Stain     Final   Value: RARE WBC PRESENT, PREDOMINANTLY MONONUCLEAR     NO ORGANISMS SEEN     Performed at [facility]   Culture PENDING   Incomplete   Report Status PENDING   Incomplete    Studies: Dg Chest 1 View 12/23/2013 No evidence for pneumothorax status post left thoracentesis.  Small left pleural effusion persists with left base collapse/ consolidation.  [place] Thoracentesis Asp Pleural Space W/img Guide 12/23/2013 Successful ultrasound-guided left sided thoracentesis yielding 1.6 liters liters of pleural fluid.   Scheduled Meds: . aspirin EC  81 mg Oral q morning - 10a  . cholecalciferol  400 Units Oral q morning - 10a  . docusate sodium  100 mg Oral BID  . DULoxetine  60 mg Oral q morning - 10a  . ezetimibe  10 mg Oral QHS  . ferrous sulfate  325 mg Oral BID  . furosemide  40 mg Intravenous Once  . furosemide  40 mg Oral Daily   .  gabapentin  300 mg Oral QHS  . insulin aspart  0-9 Units Subcutaneous TID WC  . insulin detemir  60 Units Subcutaneous q morning - 10a  . pantoprazole  40 mg Oral Daily  . propranolol  10 mg Oral BID  . rifaximin  550 mg Oral BID  . sodium chloride  3 mL Intravenous Q12H  . spironolactone  100 mg Oral q morning - 10a   Continuous Infusions:   Principal Problem:   Recurrent left pleural effusion Active Problems:   NASH (nonalcoholic steatohepatitis)   Ascites   Abdominal distension   Depression   Pleural effusion associated with hepatic disorder  Time spent: 25  Pamella Pertostin Amna Welker, MD [facility] Pager [phone]. If 7 PM - 7 AM, please contact night-coverage at [email or web address], password [email or web address] 12/24/2013, 11:34 AM  LOS: 2 days

## 2013-12-24 NOTE — Progress Notes (Signed)
Subjective: Breathing improved. Feels better after thoracentesis. Urinating more with the increased dose of diuretics.  Objective: Vital signs in last 24 hours: Temp:  [97.9 F (36.6 C)-98.5 F (36.9 C)] 97.9 F (36.6 C) (10/10 0538) Pulse Rate:  [71-72] 71 (10/10 0538) Resp:  [16-18] 18 (10/10 0538) BP: (94-112)/(28-48) 112/40 mmHg (10/10 0538) SpO2:  [93 %-96 %] 95 % (10/10 0538) Weight change:  Last BM Date: 12/21/13  PE: GEN:  Chronically ill-appearing, alert, not encephalopathic Lungs:  Improved aeration left lung, right lung clear ABD:  Distended, moderate ascites, non tender, no peritonitis NEURO:  Alert, no encephalopathy  Lab Results: CBC    Component Value Date/Time   WBC 7.4 12/24/2013 0405   RBC 4.00 12/24/2013 0405   HGB 14.1 12/24/2013 0405   HCT 41.2 12/24/2013 0405   PLT 110* 12/24/2013 0405   MCV 103.0* 12/24/2013 0405   MCH 35.3* 12/24/2013 0405   MCHC 34.2 12/24/2013 0405   RDW 15.3 12/24/2013 0405   LYMPHSABS 2.6 12/22/2013 1224   MONOABS 0.9 12/22/2013 1224   EOSABS 0.2 12/22/2013 1224   BASOSABS 0.0 12/22/2013 1224   CMP     Component Value Date/Time   NA 137 12/24/2013 0405   K 3.9 12/24/2013 0405   CL 101 12/24/2013 0405   CO2 27 12/24/2013 0405   GLUCOSE 106* 12/24/2013 0405   BUN 10 12/24/2013 0405   CREATININE 0.53 12/24/2013 0405   CALCIUM 8.2* 12/24/2013 0405   PROT 7.0 12/24/2013 0405   ALBUMIN 1.9* 12/24/2013 0405   AST 58* 12/24/2013 0405   ALT 31 12/24/2013 0405   ALKPHOS 105 12/24/2013 0405   BILITOT 2.6* 12/24/2013 0405   GFRNONAA >90 12/24/2013 0405   GFRAA >90 12/24/2013 0405   Assessment:  1.  Decompensated cirrhosis from NASH, principal manifestation of ascites and hepatic hydrothorax.  Plan:  1.  1500 cc daily fluid restriction. 2.  Low sodium (< 2 grams daily) diet. 3.  Administer another x 1 dose of furosemide 40 mg IV. 4.  Continue furosemide 40 mg po qd and spironolactone 100 mg po qd. 5.  Anticipate  patient might be able to be discharged in a couple days, but told her and her family that I wouldn't envision her being able to leave before Monday. 6.  Patient having trouble sleeping; ok to give low dose Ambien (5 mg po qhs prn).   [name],[name] M 12/24/2013, 9:36 AM

## 2013-12-25 LAB — GLUCOSE, CAPILLARY
GLUCOSE-CAPILLARY: 123 mg/dL — AB (ref 70–99)
GLUCOSE-CAPILLARY: 144 mg/dL — AB (ref 70–99)
Glucose-Capillary: 249 mg/dL — ABNORMAL HIGH (ref 70–99)
Glucose-Capillary: 73 mg/dL (ref 70–99)

## 2013-12-25 LAB — COMPREHENSIVE METABOLIC PANEL
ALT: 38 U/L — AB (ref 0–35)
AST: 94 U/L — AB (ref 0–37)
Albumin: 2.2 g/dL — ABNORMAL LOW (ref 3.5–5.2)
Alkaline Phosphatase: 98 U/L (ref 39–117)
Anion gap: 9 (ref 5–15)
BUN: 10 mg/dL (ref 6–23)
CO2: 26 mEq/L (ref 19–32)
CREATININE: 0.51 mg/dL (ref 0.50–1.10)
Calcium: 8.3 mg/dL — ABNORMAL LOW (ref 8.4–10.5)
Chloride: 98 mEq/L (ref 96–112)
GFR calc non Af Amer: >90 mL/min (ref >90)
Glucose, Bld: 62 mg/dL — ABNORMAL LOW (ref 70–99)
Potassium: 4.2 mEq/L (ref 3.7–5.3)
Sodium: 133 mEq/L — ABNORMAL LOW (ref 137–147)
TOTAL PROTEIN: 7.8 g/dL (ref 6.0–8.3)
Total Bilirubin: 3.1 mg/dL — ABNORMAL HIGH (ref 0.3–1.2)

## 2013-12-25 LAB — CBC
HCT: 44.6 % (ref 36.0–46.0)
HEMOGLOBIN: 15.7 g/dL — AB (ref 12.0–15.0)
MCH: 35.2 pg — AB (ref 26.0–34.0)
MCHC: 35.2 g/dL (ref 30.0–36.0)
MCV: 100 fL (ref 78.0–100.0)
PLATELETS: 127 10*3/uL — AB (ref 150–400)
RBC: 4.46 MIL/uL (ref 3.87–5.11)
RDW: 15.2 % (ref 11.5–15.5)
WBC: 9.4 10*3/uL (ref 4.0–10.5)

## 2013-12-25 MED ORDER — INSULIN DETEMIR 100 UNIT/ML ~~LOC~~ SOLN
45.0000 [IU] | Freq: Every morning | SUBCUTANEOUS | Status: DC
  Administered 2013-12-25 – 2013-12-27 (×3): 45 [IU] via SUBCUTANEOUS
  Filled 2013-12-25 (×4): qty 0.45

## 2013-12-25 NOTE — Progress Notes (Signed)
[facility] Progress Note  Subjective: Alert and oriented somewhat tired, walk in the halls today with minimal dyspnea  Objective: Vital signs in last 24 hours: Temp:  [98 F (36.7 C)-98.3 F (36.8 C)] 98.1 F (36.7 C) (10/11 0445) Pulse Rate:  [69-85] 73 (10/11 0445) Resp:  [16] 16 (10/11 0445) BP: (108-124)/(43-71) 124/43 mmHg (10/11 0445) SpO2:  [94 %-96 %] 95 % (10/11 0445) Weight:  [88.7 kg (195 lb 8.8 oz)] 88.7 kg (195 lb 8.8 oz) (10/11 0645) Weight change:    PE: Decreased sounds on the right Abdomen symmetrically distended semi-taut  Lab Results: Results for orders placed during the hospital encounter of 12/22/13 (from the past 24 hour(s))  GLUCOSE, CAPILLARY     Status: None   Collection Time    12/24/13  5:35 PM      Result Value Ref Range   Glucose-Capillary 92  70 - 99 mg/dL  GLUCOSE, CAPILLARY     Status: Abnormal   Collection Time    12/24/13  9:26 PM      Result Value Ref Range   Glucose-Capillary 194 (*) 70 - 99 mg/dL   Comment 1 Notify RN    COMPREHENSIVE METABOLIC PANEL     Status: Abnormal   Collection Time    12/25/13  4:50 AM      Result Value Ref Range   Sodium 133 (*) 137 - 147 mEq/L   Potassium 4.2  3.7 - 5.3 mEq/L   Chloride 98  96 - 112 mEq/L   CO2 26  19 - 32 mEq/L   Glucose, Bld 62 (*) 70 - 99 mg/dL   BUN 10  6 - 23 mg/dL   Creatinine, Ser 1.610.51  0.50 - 1.10 mg/dL   Calcium 8.3 (*) 8.4 - 10.5 mg/dL   Total Protein 7.8  6.0 - 8.3 g/dL   Albumin 2.2 (*) 3.5 - 5.2 g/dL   AST 94 (*) 0 - 37 U/L   ALT 38 (*) 0 - 35 U/L   Alkaline Phosphatase 98  39 - 117 U/L   Total Bilirubin 3.1 (*) 0.3 - 1.2 mg/dL   GFR calc non Af Amer >90  >90 mL/min   GFR calc Af Amer >90  >90 mL/min   Anion gap 9  5 - 15  CBC     Status: Abnormal   Collection Time    12/25/13  4:50 AM      Result Value Ref Range   WBC 9.4  4.0 - 10.5 K/uL   RBC 4.46  3.87 - 5.11 MIL/uL   Hemoglobin 15.7 (*) 12.0 - 15.0 g/dL   HCT 09.644.6  04.536.0 - 40.946.0 %   MCV 100.0   78.0 - 100.0 fL   MCH 35.2 (*) 26.0 - 34.0 pg   MCHC 35.2  30.0 - 36.0 g/dL   RDW 81.115.2  91.411.5 - 78.215.5 %   Platelets 127 (*) 150 - 400 K/uL  GLUCOSE, CAPILLARY     Status: Abnormal   Collection Time    12/25/13  7:27 AM      Result Value Ref Range   Glucose-Capillary 123 (*) 70 - 99 mg/dL  GLUCOSE, CAPILLARY     Status: Abnormal   Collection Time    12/25/13 11:53 AM      Result Value Ref Range   Glucose-Capillary 249 (*) 70 - 99 mg/dL    Studies/Results: Dg Chest 1 View  12/23/2013   CLINICAL DATA:  Subsequent encounter for pleural effusion. Status  post thoracentesis.  EXAM: CHEST - 1 VIEW  COMPARISON:  12/20/2013.  FINDINGS: Right lung is clear. Interval decrease and left pleural effusion which is now only small in volume. There is left base collapse/ consolidation. The cardio pericardial silhouette is enlarged. No evidence for pneumothorax. Imaged bony structures of the thorax are intact.  IMPRESSION: No evidence for pneumothorax status post left thoracentesis.  Small left pleural effusion persists with left base collapse/ consolidation.   Electronically Signed   By: Kennith CenterEric  Mansell M.D.   On: 12/23/2013 14:38   [place] Thoracentesis Asp Pleural Space W/img Guide  12/23/2013   INDICATION: Symptomatic left sided pleural effusion, patient with history of cirrhosis and recurrent left pleural effusion.  EXAM: US THORACENTESIS ASP PLEURAL SPACE W/IMG GUIDE  COMPARISON:  12/19/13 1.2 liters  MEDICATIONS: None  COMPLICATIONS: None immediate  TECHNIQUE: Informed written consent was obtained from the patient after a discussion of the risks, benefits and alternatives to treatment. A timeout was performed prior to the initiation of the procedure.  Initial ultrasound scanning demonstrates a left pleural effusion. The lower chest was prepped and draped in the usual sterile fashion. 1% lidocaine was used for local anesthesia.  Under direct ultrasound guidance, a 19 gauge, 7-cm, Yueh catheter was introduced. An  ultrasound image was saved for documentation purposes. The thoracentesis was performed. The catheter was removed and a dressing was applied. The patient tolerated the procedure well without immediate post procedural complication. The patient was escorted to have an upright chest radiograph.  FINDINGS: A total of approximately 1.6 liters of serous/amber fluid was removed. Requested samples were sent to the laboratory.  IMPRESSION: Successful ultrasound-guided left sided thoracentesis yielding 1.6 liters liters of pleural fluid.  Read By:  Pattricia BossKoreen Morgan PA-C   Electronically Signed   By: Irish LackGlenn  Yamagata M.D.   On: 12/23/2013 14:49      Assessment: Decompensated cirrhosis from nonalcoholic fatty liver disease with relatively refractory ascites and hydrothorax  Plan: 1. Continue increased doses of Lasix and spironolactone, 40 and 100 mg respectively 2. Sodium and fluid restriction 3. Followup chest x-ray 4. When necessary IV Lasix 5. Hopefully home if hydrothorax, ascites stable on increased dose of diuretics.    [place] Seaborn C 12/25/2013, 1:14 PM

## 2013-12-25 NOTE — Progress Notes (Signed)
PROGRESS NOTE  Michelle FennelVicky S Galvan IHK:[id number] DOB: 1955-05-12 DOA: 12/22/2013 PCP: [name],[name], MD  HPI: Patient was recently discharged from the hospital on 12/19/2013 after patient underwent paracentesis as well as thoracentesis for ascites as well as left pleural effusion. Patient was thought to have pneumonia and was discharged on Levaquin, patient says that she was fine for 2 days and then today again she started having shortness of breath on exertion. She denies coughing, no fever, no nausea vomiting or diarrhea. Patient has a history of [name], and is [facility]  Subjective/ 24 H Interval events - breathing stable, diuresed well, she is down to 195 lbs this morning from 200 few days ago  Assessment/Plan: NASH cirrhosis with evidence of decompensation with refractory ascites with possible hydrothorax - patient with evidence of decompensation with worsening ascites  - ascites large, not tense - needing IV diuresis s/p IV Lasix on 10/9 and 10/10, use oral today, renal function stable, potassium within normal limits - monitor I&O, daily weights - incentive spirometry - mobilize - low sodium diet and 1500 cc fluid restriction ?CAP - she has been on Levofloxacin for the past week, no cough/fevers sputum production. - clinically resolved, no need for further antibiotics IDDM - patient with few episodes of hypoglycemia, on Levemir 80 U daily, decreased to 60 U daily and again decrease to 45 today  - add SSI, appreciate diabetes educator input - Last A1C obtained last week and showed good control at 6.9 Chronic anemia - due to underlying chronic liver disease, stable Coagulopathy - due to liver disease, INR stable Chronic thrombocytopenia  - due to underlying chronic liver disease, stable  Diet: low sodium Fluids: none  DVT Prophylaxis: SCD  Code Status: Full Family Communication: d/w husband and daughter bedside  Disposition Plan: home  when ready  Consultants:  GI (Dr. Dulce Sellarutlaw)  Procedures:  None    Antibiotics  Anti-infectives   Start     Dose/Rate Route Frequency Ordered Stop   12/23/13 1000  levofloxacin (LEVAQUIN) tablet 750 mg     750 mg Oral Daily 12/22/13 1758 12/23/13 1015   12/22/13 2200  rifaximin (XIFAXAN) tablet 550 mg     550 mg Oral 2 times daily 12/22/13 1758       Studies  Filed Vitals:   12/24/13 1330 12/24/13 2012 12/25/13 0445 12/25/13 0645  BP: 108/56 116/71 124/43   Pulse: 85 69 73   Temp: 98.3 F (36.8 C) 98 F (36.7 C) 98.1 F (36.7 C)   TempSrc: Oral Oral Oral   Resp: 16 16 16    Height:      Weight:    88.7 kg (195 lb 8.8 oz)  SpO2: 94% 96% 95%     Intake/Output Summary (Last 24 hours) at 12/25/13 0745 Last data filed at 12/25/13 0700  Gross per 24 hour  Intake    865 ml  Output   1075 ml  Net   -210 ml   Filed Weights   12/22/13 1750 12/23/13 0426 12/25/13 0645  Weight: 90.719 kg (200 lb) 90.719 kg (200 lb) 88.7 kg (195 lb 8.8 oz)   Exam:  General:  NAD  Cardiovascular: RRR  Respiratory: slightly decreased breath sounds on left, no wheezing otherwise clear  Abdomen: distended, with evident ascites  MSK: no edema  Neuro: non focal   Data Reviewed: Basic Metabolic Panel:  Recent Labs Lab 12/19/13 0410 12/22/13 1224 12/23/13 0353 12/24/13 0405 12/25/13 0450  NA 135*  135* 136* 137 133*  K 4.3 4.8 4.4 3.9 4.2  CL 103 102 103 101 98  CO2 24 23 23 27 26   GLUCOSE 112* 91 117* 106* 62*  BUN 8 12 11 10 10   CREATININE 0.47* 0.44* 0.52 0.53 0.51  CALCIUM 8.0* 8.6 8.2* 8.2* 8.3*  MG  --   --   --  2.0  --   PHOS  --   --   --  2.7  --    Liver Function Tests:  Recent Labs Lab 12/18/13 1100 12/22/13 1224 12/23/13 0353 12/23/13 1200 12/24/13 0405 12/25/13 0450  AST 60* 72* 61*  --  58* 94*  ALT 33 36* 32  --  31 38*  ALKPHOS 108 109 98  --  105 98  BILITOT 3.3* 3.5* 3.6*  --  2.6* 3.1*  PROT 7.8 7.8 7.0  --  7.0 7.8  ALBUMIN 2.2* 2.3* 2.1*  2.0* 1.9* 2.2*   CBC:  Recent Labs Lab 12/19/13 0410 12/22/13 1224 12/23/13 0353 12/24/13 0405 12/25/13 0450  WBC 7.0 8.9 6.9 7.4 9.4  NEUTROABS  --  5.1  --   --   --   HGB 13.8 14.9 13.8 14.1 15.7*  HCT 40.7 42.6 40.4 41.2 44.6  MCV 103.8* 102.2* 100.7* 103.0* 100.0  PLT 123* 124* 128* 110* 127*   CBG:  Recent Labs Lab 12/23/13 2141 12/24/13 0741 12/24/13 1209 12/24/13 1735 12/24/13 2126  GLUCAP 76 118* 209* 92 194*    Recent Results (from the past 240 hour(s))  BODY FLUID CULTURE     Status: None   Collection Time    12/19/13 10:12 AM      Result Value Ref Range Status   Specimen Description PLEURAL   Final   Special Requests Immunocompromised   Final   Gram Stain     Final   Value: FEW WBC PRESENT, PREDOMINANTLY PMN     NO SQUAMOUS EPITHELIAL CELLS SEEN     NO ORGANISMS SEEN     Performed at [facility]   Culture     Final   Value: NO GROWTH 3 DAYS     Performed at [facility]   Report Status 12/22/2013 FINAL   Final  BODY FLUID CULTURE     Status: None   Collection Time    12/23/13  1:39 PM      Result Value Ref Range Status   Specimen Description PLEURAL   Final   Special Requests NONE   Final   Gram Stain     Final   Value: RARE WBC PRESENT, PREDOMINANTLY MONONUCLEAR     NO ORGANISMS SEEN     Performed at [facility]   Culture     Final   Value: NO GROWTH 1 DAY     Performed at [facility]   Report Status PENDING   Incomplete    Studies: Dg Chest 1 View 12/23/2013 No evidence for pneumothorax status post left thoracentesis.  Small left pleural effusion persists with left base collapse/ consolidation.  [place] Thoracentesis Asp Pleural Space W/img Guide 12/23/2013 Successful ultrasound-guided left sided thoracentesis yielding 1.6 liters liters of pleural fluid.   Scheduled Meds: . aspirin EC  81 mg Oral q morning - 10a  . cholecalciferol  400 Units Oral q morning - 10a  . docusate sodium  100 mg Oral BID  .  DULoxetine  60 mg Oral q morning - 10a  . ezetimibe  10 mg Oral  QHS  . ferrous sulfate  325 mg Oral BID  . furosemide  40 mg Oral Daily  . gabapentin  300 mg Oral QHS  . insulin aspart  0-9 Units Subcutaneous TID WC  . insulin detemir  60 Units Subcutaneous q morning - 10a  . pantoprazole  40 mg Oral Daily  . propranolol  10 mg Oral BID  . rifaximin  550 mg Oral BID  . sodium chloride  3 mL Intravenous Q12H  . spironolactone  100 mg Oral q morning - 10a   Continuous Infusions:   Principal Problem:   Recurrent left pleural effusion Active Problems:   NASH (nonalcoholic steatohepatitis)   Ascites   Abdominal distension   Depression   Pleural effusion associated with hepatic disorder  Time spent: 15  Pamella Pert, MD [facility] Pager [phone]. If 7 PM - 7 AM, please contact night-coverage at [email or web address], password [facility] 12/25/2013, 7:45 AM  LOS: 3 days

## 2013-12-25 NOTE — Progress Notes (Signed)
PT Cancellation Note  Patient Details Name: Derenda FennelVicky S Soule MRN: [id number] DOB: 12/16/1955   Cancelled Treatment:    Reason Eval/Treat Not Completed: PT screened, no needs identified, will sign off (spoke to pt and RN, no needs, pt amb without difficulty)   [facility],[place] 12/25/2013, 1:11 PM

## 2013-12-26 ENCOUNTER — Inpatient Hospital Stay (HOSPITAL_COMMUNITY): Payer: Managed Care, Other (non HMO)

## 2013-12-26 LAB — COMPREHENSIVE METABOLIC PANEL
ALT: 36 U/L — AB (ref 0–35)
AST: 67 U/L — ABNORMAL HIGH (ref 0–37)
Albumin: 2.1 g/dL — ABNORMAL LOW (ref 3.5–5.2)
Alkaline Phosphatase: 107 U/L (ref 39–117)
Anion gap: 8 (ref 5–15)
BUN: 9 mg/dL (ref 6–23)
CALCIUM: 8.2 mg/dL — AB (ref 8.4–10.5)
CO2: 28 meq/L (ref 19–32)
Chloride: 97 mEq/L (ref 96–112)
Creatinine, Ser: 0.49 mg/dL — ABNORMAL LOW (ref 0.50–1.10)
GLUCOSE: 121 mg/dL — AB (ref 70–99)
Potassium: 3.6 mEq/L — ABNORMAL LOW (ref 3.7–5.3)
SODIUM: 133 meq/L — AB (ref 137–147)
Total Bilirubin: 3.8 mg/dL — ABNORMAL HIGH (ref 0.3–1.2)
Total Protein: 7.4 g/dL (ref 6.0–8.3)

## 2013-12-26 LAB — GLUCOSE, CAPILLARY
GLUCOSE-CAPILLARY: 100 mg/dL — AB (ref 70–99)
GLUCOSE-CAPILLARY: 142 mg/dL — AB (ref 70–99)
GLUCOSE-CAPILLARY: 137 mg/dL — AB (ref 70–99)
Glucose-Capillary: 132 mg/dL — ABNORMAL HIGH (ref 70–99)
Glucose-Capillary: 184 mg/dL — ABNORMAL HIGH (ref 70–99)

## 2013-12-26 LAB — CBC
HCT: 43.4 % (ref 36.0–46.0)
Hemoglobin: 15.3 g/dL — ABNORMAL HIGH (ref 12.0–15.0)
MCH: 35.2 pg — ABNORMAL HIGH (ref 26.0–34.0)
MCHC: 35.3 g/dL (ref 30.0–36.0)
MCV: 99.8 fL (ref 78.0–100.0)
PLATELETS: 132 10*3/uL — AB (ref 150–400)
RBC: 4.35 MIL/uL (ref 3.87–5.11)
RDW: 15.1 % (ref 11.5–15.5)
WBC: 9.3 10*3/uL (ref 4.0–10.5)

## 2013-12-26 MED ORDER — FUROSEMIDE 80 MG PO TABS
80.0000 mg | ORAL_TABLET | Freq: Every day | ORAL | Status: DC
  Administered 2013-12-26 – 2013-12-31 (×6): 80 mg via ORAL
  Filled 2013-12-26 (×6): qty 1

## 2013-12-26 MED ORDER — IBUPROFEN 200 MG PO TABS
400.0000 mg | ORAL_TABLET | Freq: Four times a day (QID) | ORAL | Status: DC | PRN
  Administered 2013-12-26: 400 mg via ORAL
  Filled 2013-12-26: qty 2

## 2013-12-26 NOTE — Procedures (Signed)
Successful US guided paracentesis from RLQ.  Yielded 3 liters of clear yellow fluid.  No immediate complications.  Pt tolerated well.   Specimen was not sent for labs.  Pattricia BossMORGAN, Elide Stalzer D PA-C 12/26/2013 11:23 AM

## 2013-12-26 NOTE — Progress Notes (Signed)
S:  Some SOB last night, but ok now.  Has been up and walking in the halls.  O: VSS.  Weight down 0.2 kg since yesterday, on recently-increased dose of diuretics.  No overt scleral icterus despite gradually rising bili of 3.8  Chest has hint of egophony and decreased breath sds at left base; right lung clear. Abd protruberant, with equivocal flank tympany suggesting absence of significant ascites. No peripheral edema.  Cognition and mood normal.    CXR shows progressive left pleural effusion.  Gradual rise in total bilirubin.  INR has not been checked.  BUN and Cr remain nl despite diuretic increase.  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  P:  1.  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  2.  Check INR so we can see if it's rising.  3.  Although pt "looks" good and in some ways feels ready to go home, I think we should continue monitoring her in-house until we feel we are at steady state, ideally with no hydrothorax and (if ascites still present) a gradual, progressive weight loss on the order of 0.5 kg per day, in response to diuretic therapy, without azotemia developing.  I think this will take several more days to sort out.  4.  Pt and husband instructed in general principles of liver disease, NASH, ascites, hydrothorax, need for Na restrn (which they state they are already doing), need to monitor daily weights at home, and hazards of diuretic therapy (renal failure).  5.  Will do daily CMET's to assess tolerance of kidneys to diuretic therapy.  6.  Will increase furosemide today.   Will possibly increase spironolactone in 2-3 days if pt not getting desired diuresis.  7.  Pt may need thoracentesis if we can't get rid of hydrothorax by controlling ascites  through paracentesis and diuretics.  Florencia Reasonsobert V. Abbee Cremeens, M.D. [phone]

## 2013-12-26 NOTE — Progress Notes (Signed)
PROGRESS NOTE  Michelle FennelVicky S Wank ZOX:[id number] DOB: 01-29-1956 DOA: 12/22/2013 PCP: [name],[name], MD  HPI: Patient was recently discharged from the hospital on 12/19/2013 after patient underwent paracentesis as well as thoracentesis for ascites as well as left pleural effusion. Patient was thought to have pneumonia and was discharged on Levaquin, patient says that she was fine for 2 days and then today again she started having shortness of breath on exertion. She denies coughing, no fever, no nausea vomiting or diarrhea. Patient has a history of [name], and [facility]  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 ?CAP - she has been on Levofloxacin for the past week, no cough/fevers sputum production. - clinically resolved, no need for further antibiotics IDDM - patient with few episodes of hypoglycemia, on Levemir 80 U daily, decreased to 60 U daily and again decrease to 45 on 10/11, finally her CBGs are better  - add SSI, appreciate diabetes educator input - Last A1C obtained last week and showed good control at 6.9 Chronic anemia - due to underlying chronic liver disease, stable Coagulopathy - due to liver disease, INR stable Chronic thrombocytopenia - due to underlying chronic liver disease, stable  Diet: low sodium Fluids: none  DVT  Prophylaxis: SCD  Code Status: Full Family Communication: d/w husband bedside  Disposition Plan: home when ready  Consultants:  GI (Dr. Dulce Sellarutlaw)  Procedures:  None    Antibiotics  Anti-infectives   Start     Dose/Rate Route Frequency Ordered Stop   12/23/13 1000  levofloxacin (LEVAQUIN) tablet 750 mg     750 mg Oral Daily 12/22/13 1758 12/23/13 1015   12/22/13 2200  rifaximin (XIFAXAN) tablet 550 mg     550 mg Oral 2 times daily 12/22/13 1758       Studies  Filed Vitals:   12/26/13 0436 12/26/13 1040 12/26/13 1105 12/26/13 1120  BP: 123/49 119/58 120/55 116/51  Pulse: 76     Temp: 97.8 F (36.6 C)     TempSrc: Oral     Resp: 16     Height:      Weight: 88.5 kg (195 lb 1.7 oz)     SpO2: 96%       Intake/Output Summary (Last 24 hours) at 12/26/13 1300 Last data filed at 12/26/13 0600  Gross per 24 hour  Intake    698 ml  Output    500 ml  Net    198 ml   Filed Weights   12/23/13 0426 12/25/13 0645 12/26/13 0436  Weight: 90.719 kg (200 lb) 88.7 kg (195 lb 8.8 oz) 88.5 kg (195 lb 1.7 oz)   Exam:  General:  NAD  Cardiovascular: RRR  Respiratory: slightly decreased breath sounds on left, no wheezing otherwise clear  Abdomen: distended, with evident ascites  MSK: no edema  Neuro: non focal   Data Reviewed: Basic Metabolic Panel:  Recent Labs Lab 12/22/13 1224 12/23/13 0353 12/24/13 0405 12/25/13 0450 12/26/13 0435  NA 135* 136* 137 133* 133*  K 4.8 4.4 3.9 4.2 3.6*  CL 102 103 101 98 97  CO2 23 23 27 26 28   GLUCOSE 91 117* 106* 62* 121*  BUN 12 11 10 10 9   CREATININE 0.44* 0.52 0.53 0.51 0.49*  CALCIUM 8.6 8.2* 8.2* 8.3* 8.2*  MG  --   --  2.0  --   --   PHOS  --   --  2.7  --   --    Liver Function Tests:  Recent Labs Lab 12/22/13 1224 12/23/13 0353 12/23/13 1200 12/24/13 0405 12/25/13 0450 12/26/13 0435  AST 72* 61*  --  58* 94* 67*  ALT 36* 32  --  31 38* 36*  ALKPHOS 109 98  --  105 98 107  BILITOT 3.5* 3.6*  --  2.6* 3.1*  3.8*  PROT 7.8 7.0  --  7.0 7.8 7.4  ALBUMIN 2.3* 2.1* 2.0* 1.9* 2.2* 2.1*   CBC:  Recent Labs Lab 12/22/13 1224 12/23/13 0353 12/24/13 0405 12/25/13 0450 12/26/13 0435  WBC 8.9 6.9 7.4 9.4 9.3  NEUTROABS 5.1  --   --   --   --   HGB 14.9 13.8 14.1 15.7* 15.3*  HCT 42.6 40.4 41.2 44.6 43.4  MCV 102.2* 100.7* 103.0* 100.0 99.8  PLT 124* 128* 110* 127* 132*   CBG:  Recent Labs Lab 12/25/13 1716 12/25/13 2136 12/26/13 0639 12/26/13 0808 12/26/13 1154  GLUCAP 144* 73 100* 132* 142*    Recent Results (from the past 240 hour(s))  BODY FLUID CULTURE     Status: None   Collection Time    12/19/13 10:12 AM      Result Value Ref Range Status   Specimen Description PLEURAL   Final   Special Requests Immunocompromised   Final   Gram Stain     Final   Value: FEW WBC PRESENT, PREDOMINANTLY PMN     NO SQUAMOUS EPITHELIAL CELLS SEEN     NO ORGANISMS SEEN     Performed at [facility]   Culture     Final   Value: NO GROWTH 3 DAYS     Performed at [facility]   Report Status 12/22/2013 FINAL   Final  BODY FLUID CULTURE     Status: None   Collection Time    12/23/13  1:39 PM      Result Value Ref Range Status   Specimen Description PLEURAL   Final   Special Requests NONE   Final   Gram Stain     Final   Value: RARE WBC PRESENT, PREDOMINANTLY MONONUCLEAR     NO ORGANISMS SEEN     Performed at [facility]   Culture     Final   Value: NO GROWTH 3 DAYS     Performed at [facility]   Report Status PENDING   Incomplete    Studies: Dg Chest 1 View 12/23/2013 No evidence for pneumothorax status post left thoracentesis.  Small left pleural effusion persists with left base collapse/ consolidation.  [place] Thoracentesis Asp Pleural Space W/img Guide 12/23/2013 Successful ultrasound-guided left sided thoracentesis yielding 1.6 liters liters of pleural fluid.   Scheduled Meds: . aspirin EC  81 mg Oral q morning - 10a  . cholecalciferol  400 Units Oral  q morning - 10a  . docusate sodium  100 mg Oral BID  .  DULoxetine  60 mg Oral q morning - 10a  . ezetimibe  10 mg Oral QHS  . ferrous sulfate  325 mg Oral BID  . furosemide  80 mg Oral Daily  . gabapentin  300 mg Oral QHS  . insulin aspart  0-9 Units Subcutaneous TID WC  . insulin detemir  45 Units Subcutaneous q morning - 10a  . pantoprazole  40 mg Oral Daily  . propranolol  10 mg Oral BID  . rifaximin  550 mg Oral BID  . sodium chloride  3 mL Intravenous Q12H  . spironolactone  100 mg Oral q morning - 10a   Continuous Infusions:   Principal Problem:   Recurrent left pleural effusion Active Problems:   NASH (nonalcoholic steatohepatitis)   Ascites   Abdominal distension   Depression   Pleural effusion associated with hepatic disorder  Time spent: 15  Pamella Pert, MD [facility] Pager [phone]. If 7 PM - 7 AM, please contact night-coverage at [email or web address], password [facility] 12/26/2013, 1:00 PM  LOS: 4 days

## 2013-12-27 LAB — GLUCOSE, CAPILLARY
GLUCOSE-CAPILLARY: 226 mg/dL — AB (ref 70–99)
Glucose-Capillary: 93 mg/dL (ref 70–99)
Glucose-Capillary: 112 mg/dL — ABNORMAL HIGH (ref 70–99)
Glucose-Capillary: 146 mg/dL — ABNORMAL HIGH (ref 70–99)

## 2013-12-27 LAB — COMPREHENSIVE METABOLIC PANEL
ALBUMIN: 1.9 g/dL — AB (ref 3.5–5.2)
ALT: 29 U/L (ref 0–35)
AST: 53 U/L — ABNORMAL HIGH (ref 0–37)
Alkaline Phosphatase: 108 U/L (ref 39–117)
Anion gap: 10 (ref 5–15)
BUN: 14 mg/dL (ref 6–23)
CO2: 26 meq/L (ref 19–32)
Calcium: 8 mg/dL — ABNORMAL LOW (ref 8.4–10.5)
Chloride: 96 mEq/L (ref 96–112)
Creatinine, Ser: 0.8 mg/dL (ref 0.50–1.10)
GFR calc Af Amer: >90 mL/min (ref >90)
GFR calc non Af Amer: 79 mL/min — ABNORMAL LOW (ref >90)
Glucose, Bld: 163 mg/dL — ABNORMAL HIGH (ref 70–99)
Potassium: 3.5 mEq/L — ABNORMAL LOW (ref 3.7–5.3)
SODIUM: 132 meq/L — AB (ref 137–147)
Total Bilirubin: 2.5 mg/dL — ABNORMAL HIGH (ref 0.3–1.2)
Total Protein: 6.8 g/dL (ref 6.0–8.3)

## 2013-12-27 LAB — CBC
HEMATOCRIT: 41.9 % (ref 36.0–46.0)
Hemoglobin: 14.3 g/dL (ref 12.0–15.0)
MCH: 34.9 pg — ABNORMAL HIGH (ref 26.0–34.0)
MCHC: 34.1 g/dL (ref 30.0–36.0)
MCV: 102.2 fL — ABNORMAL HIGH (ref 78.0–100.0)
Platelets: 113 10*3/uL — ABNORMAL LOW (ref 150–400)
RBC: 4.1 MIL/uL (ref 3.87–5.11)
RDW: 15.3 % (ref 11.5–15.5)
WBC: 7.6 10*3/uL (ref 4.0–10.5)

## 2013-12-27 LAB — PROTIME-INR
INR: 2.05 — ABNORMAL HIGH (ref 0.00–1.49)
Prothrombin Time: 23.3 seconds — ABNORMAL HIGH (ref 11.6–15.2)

## 2013-12-27 LAB — BODY FLUID CULTURE: Culture: NO GROWTH

## 2013-12-27 MED ORDER — SPIRONOLACTONE 100 MG PO TABS
100.0000 mg | ORAL_TABLET | Freq: Two times a day (BID) | ORAL | Status: DC
  Administered 2013-12-27 – 2013-12-31 (×9): 100 mg via ORAL
  Filled 2013-12-27 (×10): qty 1

## 2013-12-27 NOTE — Progress Notes (Signed)
PROGRESS NOTE  Michelle ARAQUE WUJ:[id number] DOB: 1955-08-07 DOA: 12/22/2013 PCP: [name], MD  HPI: Patient was recently discharged from the hospital on 12/19/2013 after patient underwent paracentesis as well as thoracentesis for ascites as well as left pleural effusion. Patient was thought to have pneumonia and was discharged on Levaquin, patient says that she was fine for 2 days and then today again she started having shortness of breath on exertion. She denies coughing, no fever, no nausea vomiting or diarrhea. Patient has a history of Elita Boone, and is currently on liver transplant list at [facility].  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 She was initially diuresed with IV Lasix then her regimen was transitioned to po and slowly up-titrated per GI to 80 mg Lasix and 100 mg of Spironolactone BID. Her weight this morning is 192 lbs down from 200 lbs on admission. Per GI recommendations, will monitor patient in the hospital for ~2 additional days to achieve a stable weight prior to discharge. For now continue fluid restriction and low sodium diet. Will repeat CXR tomorrow.  Prior diagnosis of CAP - she has been on Levofloxacin for the past week prior to this hospitalization, no cough/fevers sputum production. - clinically resolved, no need for further antibiotics IDDM - patient with few episodes of hypoglycemia, on Levemir 80 U daily, decreased to 60 U daily and again decrease to 45 on 10/11, finally her CBGs are better  - add SSI, appreciate diabetes educator input - Last A1C obtained last week and showed good control at 6.9 Chronic anemia - due to underlying chronic  liver disease, stable Coagulopathy - due to liver disease, INR stable Chronic thrombocytopenia - due to underlying chronic liver disease, stable  Diet: low sodium Fluids: none  DVT Prophylaxis: SCD  Code Status: Full Family Communication: d/w husband bedside  Disposition Plan: home when ready  Consultants:  GI (Dr. Dulce Sellar)  Procedures:  None    Antibiotics  Anti-infectives   Start     Dose/Rate Route Frequency Ordered Stop   12/23/13 1000  levofloxacin (LEVAQUIN) tablet 750 mg     750 mg Oral Daily 12/22/13 1758 12/23/13 1015   12/22/13 2200  rifaximin (XIFAXAN) tablet 550 mg     550 mg Oral 2 times daily 12/22/13 1758       Studies  Filed Vitals:   12/26/13 1120 12/26/13 1400 12/26/13 2211 12/27/13 0546  BP: 116/51 100/50 106/42 99/49  Pulse:  70 68 80  Temp:  98.7 F (37.1 C) 98.1 F (36.7 C) 98.1 F (36.7 C)  TempSrc:  Oral Oral Oral  Resp:  18 20 20   Height:      Weight:    87.2 kg (192 lb 3.9 oz)  SpO2:  94% 90% 96%    Intake/Output Summary (Last 24 hours) at 12/27/13 1224 Last data filed at 12/27/13 0900  Gross per 24 hour  Intake    547 ml  Output    450 ml  Net     97 ml   Filed Weights   12/25/13 0645 12/26/13 0436 12/27/13 0546  Weight: 88.7 kg (195 lb 8.8 oz) 88.5 kg (195 lb 1.7 oz) 87.2 kg (192 lb 3.9 oz)   Exam:  General:  NAD  Cardiovascular: RRR  Respiratory: slightly decreased breath sounds on left, no wheezing otherwise clear  Abdomen: soft, obese  MSK: no edema  Neuro: non focal   Data Reviewed: Basic Metabolic Panel:  Recent Labs Lab 12/23/13 0353 12/24/13 0405 12/25/13 0450 12/26/13 0435 12/27/13 0418  NA 136* 137 133* 133* 132*  K 4.4 3.9 4.2 3.6* 3.5*  CL 103 101 98 97 96  CO2 23 27 26 28 26   GLUCOSE 117* 106* 62* 121* 163*  BUN 11 10 10 9 14   CREATININE 0.52 0.53 0.51 0.49* 0.80  CALCIUM 8.2* 8.2* 8.3* 8.2* 8.0*  MG  --  2.0  --   --   --   PHOS  --  2.7  --   --   --    Liver Function  Tests:  Recent Labs Lab 12/23/13 0353 12/23/13 1200 12/24/13 0405 12/25/13 0450 12/26/13 0435 12/27/13 0418  AST 61*  --  58* 94* 67* 53*  ALT 32  --  31 38* 36* 29  ALKPHOS 98  --  105 98 107 108  BILITOT 3.6*  --  2.6* 3.1* 3.8* 2.5*  PROT 7.0  --  7.0 7.8 7.4 6.8  ALBUMIN 2.1* 2.0* 1.9* 2.2* 2.1* 1.9*   CBC:  Recent Labs Lab 12/22/13 1224 12/23/13 0353 12/24/13 0405 12/25/13 0450 12/26/13 0435 12/27/13 0418  WBC 8.9 6.9 7.4 9.4 9.3 7.6  NEUTROABS 5.1  --   --   --   --   --   HGB 14.9 13.8 14.1 15.7* 15.3* 14.3  HCT 42.6 40.4 41.2 44.6 43.4 41.9  MCV 102.2* 100.7* 103.0* 100.0 99.8 102.2*  PLT 124* 128* 110* 127* 132* 113*   CBG:  Recent Labs Lab 12/26/13 1154 12/26/13 1654 12/26/13 2214 12/27/13 0804 12/27/13 1147  GLUCAP 142* 137* 184* 93 226*    Recent Results (from the past 240 hour(s))  BODY FLUID CULTURE     Status: None   Collection Time    12/19/13 10:12 AM      Result Value Ref Range Status   Specimen Description PLEURAL   Final   Special Requests Immunocompromised   Final   Gram Stain     Final   Value: FEW WBC PRESENT, PREDOMINANTLY PMN     NO SQUAMOUS EPITHELIAL CELLS SEEN     NO ORGANISMS SEEN     Performed at [facility]   Culture     Final   Value: NO GROWTH 3 DAYS     Performed at [facility]   Report Status 12/22/2013 FINAL   Final  BODY FLUID CULTURE     Status: None   Collection Time    12/23/13  1:39 PM      Result Value Ref Range Status   Specimen Description PLEURAL   Final   Special Requests NONE   Final   Gram Stain     Final   Value: RARE WBC PRESENT, PREDOMINANTLY MONONUCLEAR     NO ORGANISMS SEEN     Performed at [facility]   Culture     Final   Value: NO GROWTH 4 DAYS     Performed at [facility]   Report Status 12/27/2013 FINAL   Final    Studies: Dg Chest 1 View 12/23/2013 No evidence for pneumothorax status post left thoracentesis.  Small left pleural effusion persists  with left base collapse/ consolidation.  [place] Thoracentesis Asp Pleural Space W/img Guide 12/23/2013  Successful ultrasound-guided left sided thoracentesis yielding 1.6 liters liters of pleural fluid.   Scheduled Meds: . aspirin EC  81 mg Oral q morning - 10a  . cholecalciferol  400 Units Oral q morning - 10a  . docusate sodium  100 mg Oral BID  . DULoxetine  60 mg Oral q morning - 10a  . ezetimibe  10 mg Oral QHS  . ferrous sulfate  325 mg Oral BID  . furosemide  80 mg Oral Daily  . gabapentin  300 mg Oral QHS  . insulin aspart  0-9 Units Subcutaneous TID WC  . insulin detemir  45 Units Subcutaneous q morning - 10a  . pantoprazole  40 mg Oral Daily  . propranolol  10 mg Oral BID  . rifaximin  550 mg Oral BID  . sodium chloride  3 mL Intravenous Q12H  . spironolactone  100 mg Oral BID   Continuous Infusions:   Principal Problem:   Recurrent left pleural effusion Active Problems:   NASH (nonalcoholic steatohepatitis)   Ascites   Abdominal distension   Depression   Pleural effusion associated with hepatic disorder  Time spent: 15  Pamella Pertostin Lourdez Mcgahan, MD [facility] Pager [phone]. If 7 PM - 7 AM, please contact night-coverage at [email or web address], password [email or web address] 12/27/2013, 12:24 PM  LOS: 5 days

## 2013-12-27 NOTE — Progress Notes (Signed)
Feels better today following the 3 L removed during yesterday's paracentesis. Specifically, less chest "tightness" and abdomen subjectively less distended and firm.  On exam, the abdomen remains obese and difficult to assess with respect to ascites. There is tympany in the right flank, dullness in the left, correlate with the fact that apparently there was a left-sided fluid collection that was unable to be removed during yesterday's paracentesis (per discussion with the patient). Still no peripheral edema.  Labs ok--bilirubin improving, BUN & Cr rising slightly while on diuretics, but still in nl range.  Weight, as recorded, is unchanged from yesterday, despite increased furosemide dose and sufficient window for spironolactone to kick in.  Impression: It still does not seem that the diuretic therapy is doing very much.  Plan:  1. Increase spironolactone dose 2. Recheck chest x-ray tomorrow 3. My goal for this patient, prior to discharge, would be to have her on a regimen of diuretic therapy that is effectively bringing about a net reduction in fluid, especially in the chest, without leading to clinically significant renal dysfunction. It does not appear that we are at that point yet. 4. We might have to consider a thoracentesis, but as long as there is significant fluid in the abdominal cavity, it will not likely bring about sustained benefit, so I would hold off on that for the time being, and if anything, consider a repeat paracentesis before doing a thoracentesis.  Florencia Reasonsobert V. Sheron Tallman, M.D. [phone]

## 2013-12-28 ENCOUNTER — Inpatient Hospital Stay (HOSPITAL_COMMUNITY): Payer: Managed Care, Other (non HMO)

## 2013-12-28 LAB — COMPREHENSIVE METABOLIC PANEL
ALK PHOS: 102 U/L (ref 39–117)
ALT: 32 U/L (ref 0–35)
AST: 58 U/L — ABNORMAL HIGH (ref 0–37)
Albumin: 2 g/dL — ABNORMAL LOW (ref 3.5–5.2)
Anion gap: 11 (ref 5–15)
BUN: 11 mg/dL (ref 6–23)
CO2: 27 meq/L (ref 19–32)
Calcium: 8.3 mg/dL — ABNORMAL LOW (ref 8.4–10.5)
Chloride: 101 mEq/L (ref 96–112)
Creatinine, Ser: 0.54 mg/dL (ref 0.50–1.10)
GLUCOSE: 61 mg/dL — AB (ref 70–99)
POTASSIUM: 3.8 meq/L (ref 3.7–5.3)
Sodium: 139 mEq/L (ref 137–147)
TOTAL PROTEIN: 7.1 g/dL (ref 6.0–8.3)
Total Bilirubin: 3.1 mg/dL — ABNORMAL HIGH (ref 0.3–1.2)

## 2013-12-28 LAB — GLUCOSE, CAPILLARY
GLUCOSE-CAPILLARY: 176 mg/dL — AB (ref 70–99)
GLUCOSE-CAPILLARY: 66 mg/dL — AB (ref 70–99)
GLUCOSE-CAPILLARY: 113 mg/dL — AB (ref 70–99)
Glucose-Capillary: 243 mg/dL — ABNORMAL HIGH (ref 70–99)

## 2013-12-28 MED ORDER — INSULIN DETEMIR 100 UNIT/ML ~~LOC~~ SOLN
40.0000 [IU] | Freq: Every morning | SUBCUTANEOUS | Status: DC
  Administered 2013-12-28: 40 [IU] via SUBCUTANEOUS
  Filled 2013-12-28 (×2): qty 0.4

## 2013-12-28 NOTE — Progress Notes (Signed)
Hypoglycemic Event  CBG: 66 at 1806  Treatment: 15 GM carbohydrate snack  Symptoms: None  Follow-up CBG: Time:1840 CBG Result:113  Possible Reasons for Event: Unknown  Comments/MD notified:None    Elease EtienneMiller, Yamari Ventola Wayne  Remember to initiate Hypoglycemia Order Set & complete

## 2013-12-28 NOTE — Progress Notes (Signed)
Feels about the same. No acute complaints. Mild difficulty breathing, nothing significant.  Weight stable from yesterday despite increased diuretic therapy.  On exam, her left pleural effusion seems to be more extensive than before, with decreased breath sounds and mild account for me about halfway up the left chest. The abdomen still has tympany in the right side and dullness in the left side, significantly protuberant, consistent with moderate residual ascites.  BUN, Cr normal, actually a bit lower than yesterday.  CXR pending.  Will increase furosemide to 120 mg each morning and wait another 2 days for increased dose of spironolactone to kick in.  Fairly extensive discussion with patient and husband at bedside, regarding strategy, principal is of diuretic therapy in the setting, need for sodium restriction. Bedside time approximately 20 minutes.  Florencia Reasonsobert V. Emunah Texidor, M.D. [phone]

## 2013-12-28 NOTE — Procedures (Signed)
Successful US guided left thoracentesis. Yielded 1.4 liters of bloody fluid. Pt tolerated procedure well. No immediate complications.  Specimen was not sent for labs. CXR ordered.  Pattricia BossMORGAN, Keyton Bhat D PA-C 12/28/2013 2:12 PM

## 2013-12-28 NOTE — Progress Notes (Signed)
[facility] PROGRESS NOTE  [name] MWU:[id number] DOB: Nov 02, 1955 DOA: 12/22/2013 PCP: [name],[name], MD  Assessment/Plan: Principal Problem:   Recurrent left pleural effusion - GI assisting and currently plans are to find an appropriate diuretic regimen to prevent Ascites and Effusions from getting worse - Discussed limiting sodium intake - Pt is s/p thoracentesis on 10/9 with removal of 1.2 Liters - Most likely due to underlying liver disease.  Active Problems:   NASH (nonalcoholic steatohepatitis) - GI on board - Pt on lactulose, rifaximin, spironolactone, and lasix    Ascites - s/p paracentesis on 10/12 with removal of 3 Liters - GI managing diuretic regimen    Depression - stable, pt on cymbalta  IDDM - Given hypoglycemia will decrease Lantus to 40 units daily - continue to monitor blood sugars  Code Status: full Family Communication:discussed with patient and significant other at bedside Disposition Plan: Per GI   Consultants:  GI  Procedures:  As listed above  Antibiotics:  none   HPI/Subjective: Pt has no new complaints. No acute issues reported overnight.  Objective: Filed Vitals:   12/28/13 0526  BP: 131/49  Pulse: 87  Temp: 98.2 F (36.8 C)  Resp: 20    Intake/Output Summary (Last 24 hours) at 12/28/13 1336 Last data filed at 12/28/13 1108  Gross per 24 hour  Intake    360 ml  Output   1050 ml  Net   -690 ml   Filed Weights   12/26/13 0436 12/27/13 0546 12/28/13 0526  Weight: 88.5 kg (195 lb 1.7 oz) 87.2 kg (192 lb 3.9 oz) 87.4 kg (192 lb 10.9 oz)    Exam:   General:  Pt in nad, alert and awake  Cardiovascular: rrr, no mrg  Respiratory: cta bl, no wheezes, decreased breath sounds at bases L > R  Abdomen: soft, distended, no rebound tenderness, NT  Musculoskeletal: no cyanosis or clubbing   Data Reviewed: Basic Metabolic Panel:  Recent Labs Lab 12/24/13 0405 12/25/13 0450 12/26/13 0435  12/27/13 0418 12/28/13 0350  NA 137 133* 133* 132* 139  K 3.9 4.2 3.6* 3.5* 3.8  CL 101 98 97 96 101  CO2 27 26 28 26 27   GLUCOSE 106* 62* 121* 163* 61*  BUN 10 10 9 14 11   CREATININE 0.53 0.51 0.49* 0.80 0.54  CALCIUM 8.2* 8.3* 8.2* 8.0* 8.3*  MG 2.0  --   --   --   --   PHOS 2.7  --   --   --   --    Liver Function Tests:  Recent Labs Lab 12/24/13 0405 12/25/13 0450 12/26/13 0435 12/27/13 0418 12/28/13 0350  AST 58* 94* 67* 53* 58*  ALT 31 38* 36* 29 32  ALKPHOS 105 98 107 108 102  BILITOT 2.6* 3.1* 3.8* 2.5* 3.1*  PROT 7.0 7.8 7.4 6.8 7.1  ALBUMIN 1.9* 2.2* 2.1* 1.9* 2.0*   No results found for this basename: LIPASE, AMYLASE,  in the last 168 hours No results found for this basename: AMMONIA,  in the last 168 hours CBC:  Recent Labs Lab 12/22/13 1224 12/23/13 0353 12/24/13 0405 12/25/13 0450 12/26/13 0435 12/27/13 0418  WBC 8.9 6.9 7.4 9.4 9.3 7.6  NEUTROABS 5.1  --   --   --   --   --   HGB 14.9 13.8 14.1 15.7* 15.3* 14.3  HCT 42.6 40.4 41.2 44.6 43.4 41.9  MCV 102.2* 100.7* 103.0* 100.0 99.8 102.2*  PLT 124* 128* 110* 127* 132* 113*  Cardiac Enzymes: No results found for this basename: CKTOTAL, CKMB, CKMBINDEX, TROPONINI,  in the last 168 hours BNP (last 3 results) No results found for this basename: PROBNP,  in the last 8760 hours CBG:  Recent Labs Lab 12/26/13 2214 12/27/13 0804 12/27/13 1147 12/27/13 1729 12/27/13 2213  GLUCAP 184* 93 226* 112* 146*    Recent Results (from the past 240 hour(s))  BODY FLUID CULTURE     Status: None   Collection Time    12/19/13 10:12 AM      Result Value Ref Range Status   Specimen Description PLEURAL   Final   Special Requests Immunocompromised   Final   Gram Stain     Final   Value: FEW WBC PRESENT, PREDOMINANTLY PMN     NO SQUAMOUS EPITHELIAL CELLS SEEN     NO ORGANISMS SEEN     [facility]formed at [facility]   Culture     Final   Value: NO GROWTH 3 DAYS     Performed at [facility]    Report Status 12/22/2013 FINAL   Final  BODY FLUID CULTURE     Status: None   Collection Time    12/23/13  1:39 PM      Result Value Ref Range Status   Specimen Description PLEURAL   Final   Special Requests NONE   Final   Gram Stain     Final   Value: RARE WBC PRESENT, PREDOMINANTLY MONONUCLEAR     NO ORGANISMS SEEN     Performed at [facility]   Culture     Final   Value: NO GROWTH 4 DAYS     Performed at [facility]   Report Status 12/27/2013 FINAL   Final     Studies: Dg Chest 2 View  12/28/2013   CLINICAL DATA:  Pleural effusion. Pleural effusion associated with hepatic disorder J94.8, K76.9 (ICD-10-CM)  EXAM: CHEST  2 VIEW  COMPARISON:  12/26/2013.  12/22/2013.  12/16/2013.  FINDINGS: Moderate LEFT pleural effusion is present with associated atelectasis. There is no RIGHT pleural effusion. The RIGHT lung is clear. The cardiopericardial silhouette appears similar to prior. Aortic arch atherosclerosis. Cholecystectomy clips are present in the right upper quadrant.  IMPRESSION: Unchanged moderate LEFT pleural effusion with associated atelectasis.   Electronically Signed   By: Andreas NewportGeoffrey  Lamke M.D.   On: 12/28/2013 11:49    Scheduled Meds: . aspirin EC  81 mg Oral q morning - 10a  . cholecalciferol  400 Units Oral q morning - 10a  . docusate sodium  100 mg Oral BID  . DULoxetine  60 mg Oral q morning - 10a  . ezetimibe  10 mg Oral QHS  . ferrous sulfate  325 mg Oral BID  . furosemide  80 mg Oral Daily  . gabapentin  300 mg Oral QHS  . insulin aspart  0-9 Units Subcutaneous TID WC  . insulin detemir  40 Units Subcutaneous q morning - 10a  . pantoprazole  40 mg Oral Daily  . propranolol  10 mg Oral BID  . rifaximin  550 mg Oral BID  . sodium chloride  3 mL Intravenous Q12H  . spironolactone  100 mg Oral BID   Continuous Infusions:    Time spent: > 35 minutes    [name], [name]  [facility] Pager [phone] If 7PM-7AM, please contact  night-coverage at [email or web address], password [email or web address] 12/28/2013, 1:36 PM  LOS: 6 days

## 2013-12-28 NOTE — Progress Notes (Signed)
CXR looks like persistent pleural effusion (official report pending).  In view of this, and the apparent absence of response to diuretic therapy, I am going to move ahead with a repeat paracentesis and a thoracentesis this afternoon. I have called the patient's room and spoken with her husband let them know of these plans.  Florencia Reasonsobert V. Sanjuanita Condrey, M.D. [phone]

## 2013-12-29 ENCOUNTER — Inpatient Hospital Stay (HOSPITAL_COMMUNITY): Payer: Managed Care, Other (non HMO)

## 2013-12-29 LAB — COMPREHENSIVE METABOLIC PANEL
ALBUMIN: 2 g/dL — AB (ref 3.5–5.2)
ALK PHOS: 104 U/L (ref 39–117)
ALT: 30 U/L (ref 0–35)
ANION GAP: 7 (ref 5–15)
AST: 56 U/L — ABNORMAL HIGH (ref 0–37)
BUN: 11 mg/dL (ref 6–23)
CHLORIDE: 98 meq/L (ref 96–112)
CO2: 29 meq/L (ref 19–32)
CREATININE: 0.61 mg/dL (ref 0.50–1.10)
Calcium: 8.3 mg/dL — ABNORMAL LOW (ref 8.4–10.5)
GLUCOSE: 94 mg/dL (ref 70–99)
POTASSIUM: 3.8 meq/L (ref 3.7–5.3)
Sodium: 134 mEq/L — ABNORMAL LOW (ref 137–147)
Total Bilirubin: 3 mg/dL — ABNORMAL HIGH (ref 0.3–1.2)
Total Protein: 7.1 g/dL (ref 6.0–8.3)

## 2013-12-29 LAB — GLUCOSE, CAPILLARY
GLUCOSE-CAPILLARY: 174 mg/dL — AB (ref 70–99)
GLUCOSE-CAPILLARY: 125 mg/dL — AB (ref 70–99)
Glucose-Capillary: 197 mg/dL — ABNORMAL HIGH (ref 70–99)
Glucose-Capillary: 138 mg/dL — ABNORMAL HIGH (ref 70–99)

## 2013-12-29 MED ORDER — LORAZEPAM 0.5 MG PO TABS: 0.5000 mg | ORAL_TABLET | Freq: Once | ORAL | Status: AC | PRN

## 2013-12-29 MED ORDER — LORAZEPAM 0.5 MG PO TABS: 0.5000 mg | ORAL_TABLET | Freq: Once | ORAL | Status: DC

## 2013-12-29 MED ORDER — INSULIN DETEMIR 100 UNIT/ML ~~LOC~~ SOLN
20.0000 [IU] | Freq: Every morning | SUBCUTANEOUS | Status: DC
  Administered 2013-12-29 – 2013-12-31 (×3): 20 [IU] via SUBCUTANEOUS
  Filled 2013-12-29 (×3): qty 0.2

## 2013-12-29 NOTE — Procedures (Signed)
Successful US guided paracentesis from [facility].  Yielded 700 cc of clear yellow fluid.  No immediate complications.  Pt tolerated well.   Specimen was sent for labs.  Pattricia BossMORGAN, Mendell Bontempo D PA-C 12/29/2013 10:17 AM

## 2013-12-29 NOTE — Progress Notes (Signed)
Patient states that her breathing is much better today after yesterday's 1.5 L thoracentesis.  This morning, she went for paracentesis, at which time 700 ML's of fluid were removed from the left lower quadrant.  The patient's weight this morning is unchanged. Her renal function remained stable.  Impression: The patient's current diuretic therapy seems to be sufficient to prevent further accumulation of fluid, but not to have diuresed the excess fluid she already had on board, especially in the pleural and abdominal cavities.  Plan: Recheck chest x-ray tomorrow. If no significant pleural effusion has recurred, and if the patient's blood work is stable, discharge on the current regimen would probably be reasonable.  Florencia Reasonsobert V. Lizandra Zakrzewski, M.D. [phone]

## 2013-12-29 NOTE — Progress Notes (Signed)
[facility] PROGRESS NOTE  [name] NFA:[id number] DOB: 1955/04/19 DOA: 12/22/2013 PCP: [name],Michelle LORNE, MD  Assessment/Plan: Principal Problem:   Recurrent left pleural effusion - GI assisting and currently plans are to find an appropriate diuretic regimen to prevent Ascites and Effusions from getting worse - Discussed limiting sodium intake - Pt is s/p thoracentesis 12/28/13 and plans are to repeat paracentesis. These procedures to be repeated due to absence of response to diuretic regimen.   - Most likely due to underlying liver disease.  Active Problems:   NASH (nonalcoholic steatohepatitis) - GI on board - Pt on lactulose, rifaximin, spironolactone, and lasix (recently increased dose to 120 mg each morning on 12/28/13)    Ascites - s/p paracentesis on 10/12 with removal of 3 Liters, Paracentesis to be repeated 12/29/13. - GI managing diuretic regimen    Depression - stable, pt on cymbalta  IDDM - Given hypoglycemia will decrease Lantus to 20 units daily - continue to monitor blood sugars  Code Status: full Family Communication:discussed with patient and significant other at bedside Disposition Plan: Per GI   Consultants:  GI  Procedures:  As listed above  Antibiotics:  none   HPI/Subjective: Pt has no new complaints. Husband states that patient at times has been getting her days and nights mixed up.  Objective: Filed Vitals:   12/29/13 0925  BP: 113/44  Pulse:   Temp:   Resp:     Intake/Output Summary (Last 24 hours) at 12/29/13 0953 Last data filed at 12/29/13 0841  Gross per 24 hour  Intake    120 ml  Output   2700 ml  Net  -2580 ml   Filed Weights   12/27/13 0546 12/28/13 0526 12/28/13 2129  Weight: 87.2 kg (192 lb 3.9 oz) 87.4 kg (192 lb 10.9 oz) 87.454 kg (192 lb 12.8 oz)    Exam:   General:  Pt in nad, alert and awake  Cardiovascular: rrr, no mrg  Respiratory: cta bl, no wheezes, decreased breath sounds at bases L  > R  Abdomen: soft, distended, no rebound tenderness, NT  Musculoskeletal: no cyanosis or clubbing   Data Reviewed: Basic Metabolic Panel:  Recent Labs Lab 12/24/13 0405 12/25/13 0450 12/26/13 0435 12/27/13 0418 12/28/13 0350 12/29/13 0543  NA 137 133* 133* 132* 139 134*  K 3.9 4.2 3.6* 3.5* 3.8 3.8  CL 101 98 97 96 101 98  CO2 27 26 28 26 27 29   GLUCOSE 106* 62* 121* 163* 61* 94  BUN 10 10 9 14 11 11   CREATININE 0.53 0.51 0.49* 0.80 0.54 0.61  CALCIUM 8.2* 8.3* 8.2* 8.0* 8.3* 8.3*  MG 2.0  --   --   --   --   --   PHOS 2.7  --   --   --   --   --    Liver Function Tests:  Recent Labs Lab 12/25/13 0450 12/26/13 0435 12/27/13 0418 12/28/13 0350 12/29/13 0543  AST 94* 67* 53* 58* 56*  ALT 38* 36* 29 32 30  ALKPHOS 98 107 108 102 104  BILITOT 3.1* 3.8* 2.5* 3.1* 3.0*  PROT 7.8 7.4 6.8 7.1 7.1  ALBUMIN 2.2* 2.1* 1.9* 2.0* 2.0*   No results found for this basename: LIPASE, AMYLASE,  in the last 168 hours No results found for this basename: AMMONIA,  in the last 168 hours CBC:  Recent Labs Lab 12/22/13 1224 12/23/13 0353 12/24/13 0405 12/25/13 0450 12/26/13 0435 12/27/13 0418  WBC 8.9 6.9 7.4 9.4  9.3 7.6  NEUTROABS 5.1  --   --   --   --   --   HGB 14.9 13.8 14.1 15.7* 15.3* 14.3  HCT 42.6 40.4 41.2 44.6 43.4 41.9  MCV 102.2* 100.7* 103.0* 100.0 99.8 102.2*  PLT 124* 128* 110* 127* 132* 113*   Cardiac Enzymes: No results found for this basename: CKTOTAL, CKMB, CKMBINDEX, TROPONINI,  in the last 168 hours BNP (last 3 results) No results found for this basename: PROBNP,  in the last 8760 hours CBG:  Recent Labs Lab 12/28/13 1159 12/28/13 1806 12/28/13 1840 12/28/13 2133 12/29/13 0839  GLUCAP 176* 66* 113* 243* 174*    Recent Results (from the past 240 hour(s))  BODY FLUID CULTURE     Status: None   Collection Time    12/19/13 10:12 AM      Result Value Ref Range Status   Specimen Description PLEURAL   Final   Special Requests  Immunocompromised   Final   Gram Stain     Final   Value: FEW WBC PRESENT, PREDOMINANTLY PMN     NO SQUAMOUS EPITHELIAL CELLS SEEN     NO ORGANISMS SEEN     [facility]formed at [facility]   Culture     Final   Value: NO GROWTH 3 DAYS     Performed at [facility]   Report Status 12/22/2013 FINAL   Final  BODY FLUID CULTURE     Status: None   Collection Time    12/23/13  1:39 PM      Result Value Ref Range Status   Specimen Description PLEURAL   Final   Special Requests NONE   Final   Gram Stain     Final   Value: RARE WBC PRESENT, PREDOMINANTLY MONONUCLEAR     NO ORGANISMS SEEN     Performed at [facility]   Culture     Final   Value: NO GROWTH 4 DAYS     Performed at [facility]   Report Status 12/27/2013 FINAL   Final     Studies: Dg Chest 1 View  12/28/2013   CLINICAL DATA:  58 year old female status post left thoracentesis. Initial encounter.  EXAM: CHEST - 1 VIEW  COMPARISON:  1126 hr the same day and earlier.  FINDINGS: Decreased volume of left pleural effusion. No pneumothorax. Stable cardiac size and mediastinal contours. Otherwise stable chest.  IMPRESSION: Decreased left pleural effusion and no pneumothorax following left side thoracentesis.   Electronically Signed   By: Augusto GambleLee  Hall M.D.   On: 12/28/2013 14:31   Dg Chest 2 View  12/28/2013   CLINICAL DATA:  Pleural effusion. Pleural effusion associated with hepatic disorder J94.8, K76.9 (ICD-10-CM)  EXAM: CHEST  2 VIEW  COMPARISON:  12/26/2013.  12/22/2013.  12/16/2013.  FINDINGS: Moderate LEFT pleural effusion is present with associated atelectasis. There is no RIGHT pleural effusion. The RIGHT lung is clear. The cardiopericardial silhouette appears similar to prior. Aortic arch atherosclerosis. Cholecystectomy clips are present in the right upper quadrant.  IMPRESSION: Unchanged moderate LEFT pleural effusion with associated atelectasis.   Electronically Signed   By: Andreas NewportGeoffrey  Lamke M.D.   On:  12/28/2013 11:49   [place] Thoracentesis Asp Pleural Space W/img Guide  12/28/2013   INDICATION: Symptomatic recurrent left sided pleural effusion  EXAM: US THORACENTESIS ASP PLEURAL SPACE W/IMG GUIDE  COMPARISON:  Previous 12/23/13 1.6 liters amber fluid.  MEDICATIONS: None  COMPLICATIONS: None immediate  TECHNIQUE: Informed written consent  was obtained from the patient after a discussion of the risks, benefits and alternatives to treatment. A timeout was performed prior to the initiation of the procedure.  Initial ultrasound scanning demonstrates a left pleural effusion. The lower chest was prepped and draped in the usual sterile fashion. 1% lidocaine was used for local anesthesia.  Under direct ultrasound guidance, a 19 gauge, 10-cm, Yueh catheter was introduced. An ultrasound image was saved for documentation purposes. The thoracentesis was performed. The catheter was removed and a dressing was applied. The patient tolerated the procedure well without immediate post procedural complication. The patient was escorted to have an upright chest radiograph.  FINDINGS: A total of approximately 1.4 liters of bloody fluid was removed.  IMPRESSION: Successful ultrasound-guided left sided thoracentesis yielding 1.4 liters of pleural fluid.  Read By:  Pattricia Boss PA-C   Electronically Signed   By: Malachy Moan M.D.   On: 12/28/2013 14:37    Scheduled Meds: . aspirin EC  81 mg Oral q morning - 10a  . cholecalciferol  400 Units Oral q morning - 10a  . docusate sodium  100 mg Oral BID  . DULoxetine  60 mg Oral q morning - 10a  . ezetimibe  10 mg Oral QHS  . ferrous sulfate  325 mg Oral BID  . furosemide  80 mg Oral Daily  . gabapentin  300 mg Oral QHS  . insulin aspart  0-9 Units Subcutaneous TID WC  . insulin detemir  20 Units Subcutaneous q morning - 10a  . pantoprazole  40 mg Oral Daily  . propranolol  10 mg Oral BID  . rifaximin  550 mg Oral BID  . sodium chloride  3 mL Intravenous Q12H  .  spironolactone  100 mg Oral BID   Continuous Infusions:    Time spent: > 35 minutes    Penny Pia  [facility] Pager [phone] If 7PM-7AM, please contact night-coverage at [email or web address], password [facility] 12/29/2013, 9:53 AM  LOS: 7 days

## 2013-12-30 ENCOUNTER — Inpatient Hospital Stay (HOSPITAL_COMMUNITY): Payer: Managed Care, Other (non HMO)

## 2013-12-30 LAB — COMPREHENSIVE METABOLIC PANEL
ALBUMIN: 2.1 g/dL — AB (ref 3.5–5.2)
ALK PHOS: 106 U/L (ref 39–117)
ALT: 31 U/L (ref 0–35)
AST: 56 U/L — ABNORMAL HIGH (ref 0–37)
Anion gap: 9 (ref 5–15)
BILIRUBIN TOTAL: 3.2 mg/dL — AB (ref 0.3–1.2)
BUN: 12 mg/dL (ref 6–23)
CHLORIDE: 97 meq/L (ref 96–112)
CO2: 28 mEq/L (ref 19–32)
Calcium: 8.4 mg/dL (ref 8.4–10.5)
Creatinine, Ser: 0.59 mg/dL (ref 0.50–1.10)
GFR calc non Af Amer: >90 mL/min (ref >90)
GLUCOSE: 100 mg/dL — AB (ref 70–99)
POTASSIUM: 3.7 meq/L (ref 3.7–5.3)
SODIUM: 134 meq/L — AB (ref 137–147)
Total Protein: 7.3 g/dL (ref 6.0–8.3)

## 2013-12-30 LAB — GLUCOSE, CAPILLARY
GLUCOSE-CAPILLARY: 221 mg/dL — AB (ref 70–99)
GLUCOSE-CAPILLARY: 152 mg/dL — AB (ref 70–99)
Glucose-Capillary: 81 mg/dL (ref 70–99)
Glucose-Capillary: 212 mg/dL — ABNORMAL HIGH (ref 70–99)

## 2013-12-30 NOTE — Progress Notes (Signed)
Patient continues to feel very well, and ready to go home.  On exam, her weight is supposedly down 3 kg from yesterday. She is in good spirits. The abdomen has bilateral flank tympany, and although it is rather adipose, it does not appear, by exam, that there is any significant residual ascitic fluid present, following yesterday's paracentesis. However, just exam shows reduction of breath sounds and it of any halfway up the left chest, suggesting a recurrent pleural effusion.   The chest x-ray from today is pending. Her labs remain stable on diuretic therapy.  Impression: Clinically improved, but with concerns for recurrent left pleural effusion  Plan: Await chest x-ray result. If a significant pleural effusion is present, I would favor a repeat thoracentesis today. If no significant pleural effusion is present, I think discharge home on the current regimen would be appropriate. The patient has a followup appointment with Dr. Odelia GageWill Outlaw for November 12 at 2:45 PM (although we are in the process of seeing if we can get that appointment moved up).  Please call me to discuss, if you have any questions.  Florencia Reasonsobert V. Costas Sena, M.D. [phone]

## 2013-12-30 NOTE — Care Management Note (Signed)
CARE MANAGEMENT NOTE 12/30/2013  Patient:  [name],[name]   Account Number:  [id number]  Date Initiated:  12/23/2013  Documentation initiated by:  Sandford CrazeLEMENTS,Adrean Heitz  Subjective/Objective Assessment:   58 yo admitted for SOB.  Hx of recurrent Pleural Effusions, NASH, [facility]     Action/Plan:   From home with spouse   Anticipated DC Date:  12/25/2013   Anticipated DC Plan:  HOME/SELF CARE      DC Planning Services  CM consult      Choice offered to / List presented to:             Status of service:  In process, will continue to follow Medicare Important Message given?   (If response is "NO", the following Medicare IM given date fields will be blank) Date Medicare IM given:   Medicare IM given by:   Date Additional Medicare IM given:   Additional Medicare IM given by:    Discharge Disposition:    Per UR Regulation:  Reviewed for med. necessity/level of care/duration of stay  If discussed at Long Length of Stay Meetings, dates discussed:   12/27/2013  12/29/2013    Comments:  12/30/13 Sandford CrazeNora Orah Sonnen RN,BSN,NCM Pts husband concerned about care for pt at home while he is at work.  Husband states that she moves around pretty well by herself but that he is required to leave her daily for long periods of time for work.  He would like to have someone be able to check in on her and make sure she is ok. Dr. Cena BentonVega paged and asked for a PT eval to give home recommendations.  Husband also provided with private duty agency list.  Will await PT eval and assist as needed.  12/23/13 Sandford CrazeNora Stuart Guillen RN,BSN,NCM Monitoring for DC needs.  CM will continue to follow.

## 2013-12-30 NOTE — Progress Notes (Signed)
[facility] PROGRESS NOTE  Michelle FennelVicky S Galvan WNU:[id number] DOB: 1956/03/10 DOA: 12/22/2013 PCP: [name],[name], MD  Assessment/Plan: Principal Problem:   Recurrent left pleural effusion- Most likely due to underlying liver disease. - GI assisting and currently plans are to find an appropriate diuretic regimen to prevent Ascites and Effusions from getting worse. - Discussed limiting sodium intake with husband  - Repeat chest x ray reporting enlarging left pleural effusion - May have to consider candidacy for TIPs procedure  Active Problems:   NASH (nonalcoholic steatohepatitis) - GI on board - Pt on lactulose, rifaximin, spironolactone, and lasix (recently increased dose to 120 mg each morning on 12/28/13) - Blood pressures soft as such limiting increasing lasix or spironolactone dosing.    Ascites - s/p paracentesis on 10/12 with removal of 3 Liters, Paracentesis repeated on 12/29/13. - GI assisting with diuretic regimen    Depression - stable, pt on cymbalta  IDDM - Given hypoglycemia will decrease Lantus to 20 units daily - continue to monitor blood sugars, improved on above lantus dose  Code Status: full Family Communication:discussed with patient and significant other at bedside Disposition Plan: Per GI   Consultants:  GI  Procedures:  As listed above  Antibiotics:  none   HPI/Subjective: Pt's husband had questions regarding how he could help once the patient is transitioned home. We discussed appropriate diet and fluid restriction.  Objective: Filed Vitals:   12/30/13 0505  BP: 98/42  Pulse: 72  Temp: 98.4 F (36.9 C)  Resp: 18    Intake/Output Summary (Last 24 hours) at 12/30/13 1541 Last data filed at 12/30/13 0218  Gross per 24 hour  Intake      0 ml  Output   1200 ml  Net  -1200 ml   Filed Weights   12/28/13 0526 12/28/13 2129 12/30/13 0500  Weight: 87.4 kg (192 lb 10.9 oz) 87.454 kg (192 lb 12.8 oz) 84.7 kg (186 lb 11.7 oz)     Exam:   General:  Pt in nad, alert and awake  Cardiovascular: rrr, no mrg  Respiratory: cta bl, no wheezes, improved aeration at left lung, decreased breath sounds at left base  Abdomen: soft,  no rebound tenderness, NT  Musculoskeletal: no cyanosis or clubbing   Data Reviewed: Basic Metabolic Panel:  Recent Labs Lab 12/24/13 0405  12/26/13 0435 12/27/13 0418 12/28/13 0350 12/29/13 0543 12/30/13 0419  NA 137  < > 133* 132* 139 134* 134*  K 3.9  < > 3.6* 3.5* 3.8 3.8 3.7  CL 101  < > 97 96 101 98 97  CO2 27  < > 28 26 27 29 28   GLUCOSE 106*  < > 121* 163* 61* 94 100*  BUN 10  < > 9 14 11 11 12   CREATININE 0.53  < > 0.49* 0.80 0.54 0.61 0.59  CALCIUM 8.2*  < > 8.2* 8.0* 8.3* 8.3* 8.4  MG 2.0  --   --   --   --   --   --   PHOS 2.7  --   --   --   --   --   --   < > = values in this interval not displayed. Liver Function Tests:  Recent Labs Lab 12/26/13 0435 12/27/13 0418 12/28/13 0350 12/29/13 0543 12/30/13 0419  AST 67* 53* 58* 56* 56*  ALT 36* 29 32 30 31  ALKPHOS 107 108 102 104 106  BILITOT 3.8* 2.5* 3.1* 3.0* 3.2*  PROT 7.4 6.8 7.1 7.1  7.3  ALBUMIN 2.1* 1.9* 2.0* 2.0* 2.1*   No results found for this basename: LIPASE, AMYLASE,  in the last 168 hours No results found for this basename: AMMONIA,  in the last 168 hours CBC:  Recent Labs Lab 12/24/13 0405 12/25/13 0450 12/26/13 0435 12/27/13 0418  WBC 7.4 9.4 9.3 7.6  HGB 14.1 15.7* 15.3* 14.3  HCT 41.2 44.6 43.4 41.9  MCV 103.0* 100.0 99.8 102.2*  PLT 110* 127* 132* 113*   Cardiac Enzymes: No results found for this basename: CKTOTAL, CKMB, CKMBINDEX, TROPONINI,  in the last 168 hours BNP (last 3 results) No results found for this basename: PROBNP,  in the last 8760 hours CBG:  Recent Labs Lab 12/29/13 1153 12/29/13 1709 12/29/13 2226 12/30/13 0745 12/30/13 1230  GLUCAP 197* 138* 125* 81 221*    Recent Results (from the past 240 hour(s))  BODY FLUID CULTURE     Status: None    Collection Time    12/23/13  1:39 PM      Result Value Ref Range Status   Specimen Description PLEURAL   Final   Special Requests NONE   Final   Gram Stain     Final   Value: RARE WBC PRESENT, PREDOMINANTLY MONONUCLEAR     NO ORGANISMS SEEN     Performed at [facility]   Culture     Final   Value: NO GROWTH 4 DAYS     Performed at [facility]   Report Status 12/27/2013 FINAL   Final     Studies: Dg Chest 2 View  12/30/2013   CLINICAL DATA:  58 year old female with nonalcoholic cirrhosis, COPD and recurrent pleural effusions  EXAM: CHEST  2 VIEW  COMPARISON:  Prior chest x-ray 12/29/2013  FINDINGS: Enlarging left pleural effusion with associated left basilar atelectasis. Cardiac and mediastinal contours remain unchanged. Atherosclerotic calcifications present within the transverse aorta. The right lung remains relatively clear. No acute osseous abnormality.  IMPRESSION: Enlarging left pleural effusion with associated left basilar atelectasis. In the setting of cirrhosis, hepatic hydrothorax is a likely consideration. Consider referral to interventional radiology to evaluate candidacy for possible TIPS procedure.   Electronically Signed   By: Malachy Moan M.D.   On: 12/30/2013 11:23   Dg Chest 2 View  12/29/2013   CLINICAL DATA:  Pleural effusion; liver failure  EXAM: CHEST  2 VIEW  COMPARISON:  December 28, 2013  FINDINGS: There is a persistent left pleural effusion with left base consolidation. Right lung is clear. Heart size and pulmonary vascularity are normal. No adenopathy. No bone lesions.  IMPRESSION: Persistent left lower lobe consolidation with effusion. Right lung clear. No change in cardiac silhouette. No appreciable change from 1 day prior.   Electronically Signed   By: Bretta Bang M.D.   On: 12/29/2013 10:17   US Paracentesis  12/29/2013   INDICATION: Cirrhosis, recurrent ascites, request for paracentesis  EXAM: ULTRASOUND-GUIDED PARACENTESIS   COMPARISON:  12/26/13.  MEDICATIONS: None.  COMPLICATIONS: None immediate  TECHNIQUE: Informed written consent was obtained from the patient after a discussion of the risks, benefits and alternatives to treatment. A timeout was performed prior to the initiation of the procedure.  Initial ultrasound scanning demonstrates a large amount of ascites within the left lower abdominal quadrant. The right lower abdomen was prepped and draped in the usual sterile fashion. 1% lidocaine with epinephrine was used for local anesthesia. Under direct ultrasound guidance, a 19 gauge, 10-cm, Yueh catheter was introduced. An ultrasound image  was saved for documentation purposed. The paracentesis was performed. The catheter was removed and a dressing was applied. The patient tolerated the procedure well without immediate post procedural complication.  FINDINGS: A total of approximately 700 ml liters of serous fluid was removed. Samples were sent to the laboratory as requested by the clinical team.  IMPRESSION: Successful ultrasound-guided paracentesis yielding 700 ml liters of peritoneal fluid.  Read By:  Pattricia BossKoreen Morgan PA-C   Electronically Signed   By: Malachy MoanHeath  McCullough M.D.   On: 12/29/2013 12:08    Scheduled Meds: . aspirin EC  81 mg Oral q morning - 10a  . cholecalciferol  400 Units Oral q morning - 10a  . docusate sodium  100 mg Oral BID  . DULoxetine  60 mg Oral q morning - 10a  . ezetimibe  10 mg Oral QHS  . ferrous sulfate  325 mg Oral BID  . furosemide  80 mg Oral Daily  . gabapentin  300 mg Oral QHS  . insulin aspart  0-9 Units Subcutaneous TID WC  . insulin detemir  20 Units Subcutaneous q morning - 10a  . pantoprazole  40 mg Oral Daily  . propranolol  10 mg Oral BID  . rifaximin  550 mg Oral BID  . sodium chloride  3 mL Intravenous Q12H  . spironolactone  100 mg Oral BID   Continuous Infusions:    Time spent: > 35 minutes    [name], [name]  [facility] Pager [phone] If 7PM-7AM, please  contact night-coverage at [email or web address], password [email or web address] 12/30/2013, 3:41 PM  LOS: 8 days

## 2013-12-31 LAB — COMPREHENSIVE METABOLIC PANEL
ALT: 30 U/L (ref 0–35)
ANION GAP: 9 (ref 5–15)
AST: 56 U/L — AB (ref 0–37)
Albumin: 2 g/dL — ABNORMAL LOW (ref 3.5–5.2)
Alkaline Phosphatase: 109 U/L (ref 39–117)
BUN: 13 mg/dL (ref 6–23)
CO2: 29 mEq/L (ref 19–32)
Calcium: 8.6 mg/dL (ref 8.4–10.5)
Chloride: 98 mEq/L (ref 96–112)
Creatinine, Ser: 0.6 mg/dL (ref 0.50–1.10)
GFR calc Af Amer: >90 mL/min (ref >90)
GFR calc non Af Amer: >90 mL/min (ref >90)
Glucose, Bld: 93 mg/dL (ref 70–99)
POTASSIUM: 3.6 meq/L — AB (ref 3.7–5.3)
SODIUM: 136 meq/L — AB (ref 137–147)
TOTAL PROTEIN: 7.3 g/dL (ref 6.0–8.3)
Total Bilirubin: 3.2 mg/dL — ABNORMAL HIGH (ref 0.3–1.2)

## 2013-12-31 LAB — GLUCOSE, CAPILLARY
GLUCOSE-CAPILLARY: 138 mg/dL — AB (ref 70–99)
Glucose-Capillary: 96 mg/dL (ref 70–99)

## 2013-12-31 MED ORDER — INSULIN LISPRO 100 UNIT/ML ~~LOC~~ SOLN: 3.0000 [IU] | Freq: Every day | SUBCUTANEOUS | Status: AC

## 2013-12-31 MED ORDER — SPIRONOLACTONE 100 MG PO TABS: 100.0000 mg | ORAL_TABLET | Freq: Two times a day (BID) | ORAL | Status: AC

## 2013-12-31 MED ORDER — FUROSEMIDE 80 MG PO TABS: 80.0000 mg | ORAL_TABLET | Freq: Every day | ORAL | Status: AC

## 2013-12-31 MED ORDER — INSULIN DETEMIR 100 UNIT/ML ~~LOC~~ SOLN: 20.0000 [IU] | Freq: Every morning | SUBCUTANEOUS | Status: AC

## 2013-12-31 MED ORDER — INSULIN LISPRO 100 UNIT/ML ~~LOC~~ SOLN: 3.0000 [IU] | Freq: Every day | SUBCUTANEOUS | Status: DC

## 2013-12-31 NOTE — Plan of Care (Signed)
Problem: Phase III Progression Outcomes Goal: Pain controlled on oral analgesia Outcome: Not Applicable Date Met:  70/17/79 Patient denies pain.

## 2013-12-31 NOTE — Progress Notes (Signed)
Discharge instructions reviewed with patient and her husband using teach back method and they demonstrated understanding.  Patient appears stable for discharge home.  Patient's assessment unchanged from this am.  Patient and her husband understand instructions for follow up with GI MD.  [name], Kaevon Cotta [place]  12/31/2013  3:20 PM

## 2013-12-31 NOTE — Progress Notes (Signed)
Pt feels well and is breathing ok even though yesterday's CXR showed recurrence of pleural effusion.   Labs remain stable on increased diuretic therapy; pt has lost approx. 12 lbs (incl thoracentesis) over the past 2 days, by recorded weights (?accuracy).  On exam, NAD.  Chest exam similar to 2 days ago:  Moderate left pleural effusion c/w CXR, as evidenced by decreased breath sds halfway up left chest.  Right chest is clear.  Abd is obese, w/ flank dullness on right side (had been lying on right side before I came in) c/w recrr ascites.  Recomm:  dischg home today  1.  Discussed various options w/ pt & husband:  Remain in hosp on diuretics over weekend and get thoracentesis on Monday, OR go home on current regimen and arrange for outpt thoracentesis through our office.  Pt prefers to go home today and I feel that is completely acceptable--she will call Dr. Hulen Shoutsutlaw's assistant on Monday to arr paracentesis as a prelude to thoracentesis.  2.  Discussed TIPS in genl terms w/ pt & husband--this might be a good idea if the above conservative measures meet w/ no success; would defer this decision to pt's primary GI (Dr. Dulce Sellarutlaw), [facility].  Florencia Reasonsobert V. Halyn Flaugher, M.D. [phone]

## 2013-12-31 NOTE — Discharge Summary (Signed)
Physician Discharge Summary  Michelle FennelVicky S Galvan UJW:[id number]:[id number] DOB: September 30, 1955 DOA: 12/22/2013  PCP: [name],[name], MD  Admit date: 12/22/2013 Discharge date: 12/31/2013  Time spent: > 35 minutes  Recommendations for Outpatient Follow-up:  Pt to f/u with Dr. Dulce Sellarutlaw for further evaluation and recommendations.  Discharge Diagnoses:  Principal Problem:   Recurrent left pleural effusion Active Problems:   NASH (nonalcoholic steatohepatitis)   Ascites   Abdominal distension   Depression   Pleural effusion associated with hepatic disorder   Discharge Condition: full  Diet recommendation: low sodium diet.  Filed Weights   12/28/13 2129 12/30/13 0500 12/31/13 0504  Weight: 87.454 kg (192 lb 12.8 oz) 84.7 kg (186 lb 11.7 oz) 81.7 kg (180 lb 1.9 oz)    History of present illness:  58 y/o with h/o DM; HPL, HTN; liver cirhosis that presented with SOB and found to have ascites and pleural effusion.  Hospital Course:  Principal Problem:  Recurrent left pleural effusion- Most likely due to underlying liver disease.  - GI assisted and currently plans are for discharge on lasix 80 mg po daily and spironolacone 100 mg po bid - Discussed limiting sodium intake with husband  - May have to consider candidacy for TIPs procedure, to be discussed with Dr. Dulce Sellarutlaw on f/u   Active Problems:  NASH (nonalcoholic steatohepatitis)  - GI on board  - Pt on lactulose, rifaximin, spironolactone, and lasix (recently increased dose to 120 mg each morning on 12/28/13)  - Blood pressures soft as such limiting increasing lasix or spironolactone dosing.   Ascites  - s/p paracentesis on 10/12 with removal of 3 Liters, Paracentesis repeated on 12/29/13.  - GI assistedwith diuretic regimen. Please see above  Depression  - stable, pt on cymbalta   IDDM  - Given hypoglycemia Decreased Lantus to 20 units daily  - Also decreased novolog dose - discontinued  Jardiance   Procedures:  None  Consultations:  GI: Dr. Matthias HughsBuccini  Discharge Exam: Filed Vitals:   12/31/13 0504  BP: 86/72  Pulse: 79  Temp: 97.8 F (36.6 C)  Resp: 18    General: Pt in nad, alert and awake Cardiovascular: rrr, no mrg Respiratory: cta bl, no wheezes, diminished breath sounds over left base  Discharge Instructions You were cared for by a hospitalist during your hospital stay. If you have any questions about your discharge medications or the care you received while you were in the hospital after you are discharged, you can call the unit and asked to speak with the hospitalist on call if the hospitalist that took care of you is not available. Once you are discharged, your primary care physician will handle any further medical issues. Please note that NO REFILLS for any discharge medications will be authorized once you are discharged, as it is imperative that you return to your primary care physician (or establish a relationship with a primary care physician if you do not have one) for your aftercare needs so that they can reassess your need for medications and monitor your lab values.  Discharge Instructions   Call MD for:  severe uncontrolled pain    Complete by:  As directed      Call MD for:  temperature >100.4    Complete by:  As directed      Diet - low sodium heart healthy    Complete by:  As directed      Discharge instructions    Complete by:  As directed   Please be sure to  follow up with your GI physician in 1-2 weeks or sooner should any new concerns arise.     Increase activity slowly    Complete by:  As directed           Current Discharge Medication List    CONTINUE these medications which have CHANGED   Details  furosemide (LASIX) 80 MG tablet Take 1 tablet (80 mg total) by mouth daily. Qty: 40 tablet, Refills: 0    insulin detemir (LEVEMIR) 100 UNIT/ML injection Inject 0.2 mLs (20 Units total) into the skin every morning. Qty: 10 mL,  Refills: 11    insulin lispro (HUMALOG) 100 UNIT/ML injection Inject 0.03 mLs (3 Units total) into the skin daily with supper. Qty: 10 mL, Refills: 0    spironolactone (ALDACTONE) 100 MG tablet Take 1 tablet (100 mg total) by mouth 2 (two) times daily. Qty: 60 tablet, Refills: 0      CONTINUE these medications which have NOT CHANGED   Details  aspirin EC 81 MG tablet Take 81 mg by mouth every morning.    cholecalciferol (VITAMIN D) 400 UNITS TABS tablet Take 400 Units by mouth every morning.    colesevelam (WELCHOL) 625 MG tablet Take 1,875 mg by mouth 2 (two) times daily. Three pills taken per dosage. Total of six per day.    diphenhydrAMINE (BENADRYL) 25 MG tablet Take 25 mg by mouth every 6 (six) hours as needed for itching (itching).    DULoxetine (CYMBALTA) 60 MG capsule Take 60 mg by mouth every morning.     esomeprazole (NEXIUM) 40 MG capsule Take 40 mg by mouth daily before breakfast.     ezetimibe (ZETIA) 10 MG tablet Take 10 mg by mouth at bedtime.     ferrous sulfate 325 (65 FE) MG tablet Take 325 mg by mouth 2 (two) times daily.    gabapentin (NEURONTIN) 300 MG capsule Take 300 mg by mouth at bedtime.     lactulose (CHRONULAC) 10 GM/15ML solution Take 20 g by mouth 3 (three) times daily as needed for mild constipation (constipation).     meloxicam (MOBIC) 7.5 MG tablet Take 7.5 mg by mouth every morning.     omeprazole (PRILOSEC) 20 MG capsule Take 20 mg by mouth daily.    propranolol (INDERAL) 10 MG tablet Take 10 mg by mouth 2 (two) times daily.     rifaximin (XIFAXAN) 550 MG TABS tablet Take 550 mg by mouth 2 (two) times daily.      STOP taking these medications     clobetasol (OLUX) 0.05 % topical foam      clobetasol cream (TEMOVATE) 0.05 %      Empagliflozin (JARDIANCE) 10 MG TABS      fluocinonide cream (LIDEX) 0.05 %      levofloxacin (LEVAQUIN) 750 MG tablet      zinc gluconate 50 MG tablet        Allergies  Allergen Reactions  . Latex  Rash  . Actos [Pioglitazone] Swelling and Other (See Comments)    edema Edema   . Byetta 10 Mcg Pen [Exenatide] Other (See Comments)    Stomach upset  . Crestor [Rosuvastatin] Other (See Comments)    Other reaction(s): Liver Disorder Elevated LFT's Increases LFT  . Lipitor [Atorvastatin] Other (See Comments)    Other reaction(s): Muscle Pain Muscle aches  . Other Other (See Comments)    Stomach upset  . Penicillin G Hives  . Penicillins Hives and Itching    All over the body.  The results of significant diagnostics from this hospitalization (including imaging, microbiology, ancillary and laboratory) are listed below for reference.    Significant Diagnostic Studies: Dg Chest 1 View  12/28/2013   CLINICAL DATA:  58 year old female status post left thoracentesis. Initial encounter.  EXAM: CHEST - 1 VIEW  COMPARISON:  1126 hr the same day and earlier.  FINDINGS: Decreased volume of left pleural effusion. No pneumothorax. Stable cardiac size and mediastinal contours. Otherwise stable chest.  IMPRESSION: Decreased left pleural effusion and no pneumothorax following left side thoracentesis.   Electronically Signed   By: Augusto Gamble M.D.   On: 12/28/2013 14:31   Dg Chest 1 View  12/23/2013   CLINICAL DATA:  Subsequent encounter for pleural effusion. Status post thoracentesis.  EXAM: CHEST - 1 VIEW  COMPARISON:  12/20/2013.  FINDINGS: Right lung is clear. Interval decrease and left pleural effusion which is now only small in volume. There is left base collapse/ consolidation. The cardio pericardial silhouette is enlarged. No evidence for pneumothorax. Imaged bony structures of the thorax are intact.  IMPRESSION: No evidence for pneumothorax status post left thoracentesis.  Small left pleural effusion persists with left base collapse/ consolidation.   Electronically Signed   By: Kennith Center M.D.   On: 12/23/2013 14:38   Dg Chest 1 View  12/19/2013   CLINICAL DATA:  Status post left-sided  thoracentesis for effusion  EXAM: CHEST - 1 VIEW  COMPARISON:  December 17, 2013  FINDINGS: Effusion is smaller on the left post thoracentesis. Moderate effusion remains on the left with left base consolidation/atelectasis. Right lung is clear. Heart is upper normal in size with pulmonary vascularity within normal limits. No adenopathy. No bone lesions.  IMPRESSION: Left effusion smaller post thoracentesis. Moderate effusion remains with left base atelectasis/ consolidation. Right lung clear. No apparent pneumothorax.   Electronically Signed   By: Bretta Bang M.D.   On: 12/19/2013 10:44   Dg Chest 2 View  12/30/2013   CLINICAL DATA:  58 year old female with nonalcoholic cirrhosis, COPD and recurrent pleural effusions  EXAM: CHEST  2 VIEW  COMPARISON:  Prior chest x-ray 12/29/2013  FINDINGS: Enlarging left pleural effusion with associated left basilar atelectasis. Cardiac and mediastinal contours remain unchanged. Atherosclerotic calcifications present within the transverse aorta. The right lung remains relatively clear. No acute osseous abnormality.  IMPRESSION: Enlarging left pleural effusion with associated left basilar atelectasis. In the setting of cirrhosis, hepatic hydrothorax is a likely consideration. Consider referral to interventional radiology to evaluate candidacy for possible TIPS procedure.   Electronically Signed   By: Malachy Moan M.D.   On: 12/30/2013 11:23   Dg Chest 2 View  12/29/2013   CLINICAL DATA:  Pleural effusion; liver failure  EXAM: CHEST  2 VIEW  COMPARISON:  December 28, 2013  FINDINGS: There is a persistent left pleural effusion with left base consolidation. Right lung is clear. Heart size and pulmonary vascularity are normal. No adenopathy. No bone lesions.  IMPRESSION: Persistent left lower lobe consolidation with effusion. Right lung clear. No change in cardiac silhouette. No appreciable change from 1 day prior.   Electronically Signed   By: Bretta Bang M.D.    On: 12/29/2013 10:17   Dg Chest 2 View  12/28/2013   CLINICAL DATA:  Pleural effusion. Pleural effusion associated with hepatic disorder J94.8, K76.9 (ICD-10-CM)  EXAM: CHEST  2 VIEW  COMPARISON:  12/26/2013.  12/22/2013.  12/16/2013.  FINDINGS: Moderate LEFT pleural effusion is present with associated atelectasis. There is no  RIGHT pleural effusion. The RIGHT lung is clear. The cardiopericardial silhouette appears similar to prior. Aortic arch atherosclerosis. Cholecystectomy clips are present in the right upper quadrant.  IMPRESSION: Unchanged moderate LEFT pleural effusion with associated atelectasis.   Electronically Signed   By: Andreas Newport M.D.   On: 12/28/2013 11:49   Dg Chest 2 View  12/26/2013   CLINICAL DATA:  Shortness of breath, pleural effusion, hypertension  EXAM: CHEST  2 VIEW  COMPARISON:  12/23/2013  FINDINGS: Cardiomediastinal silhouette is stable. There is moderate size left pleural effusion with left lower lobe atelectasis. No pulmonary edema. Right lung is clear.  IMPRESSION: Moderate size left pleural effusion increased from prior exam. Left lower lobe atelectasis. No pulmonary edema. Right lung is clear.   Electronically Signed   By: Natasha Mead M.D.   On: 12/26/2013 08:23   Dg Chest 2 View  12/22/2013   CLINICAL DATA:  Shortness of breath. Prior thoracentesis. Cirrhosis. Follow-up exam.  EXAM: CHEST  2 VIEW  COMPARISON:  12/19/2013 .  FINDINGS: Mediastinum hilar structures normal. Stable left pleural effusion. Mild bibasilar atelectasis. No pneumothorax. Heart size normal. No acute bony abnormality.  IMPRESSION: 1. Stable left pleural effusion.  Bibasilar atelectasis. 2. No acute abnormality.   Electronically Signed   By: Maisie Fus  Register   On: 12/22/2013 13:03   Dg Chest 2 View  12/17/2013   CLINICAL DATA:  Pneumonia and chest tightness and shortness of breath ; history of COPD, diabetes, and hepatic encephalopathy  EXAM: CHEST  2 VIEW  COMPARISON:  PA and lateral chest  x-ray of December 16, 2013  FINDINGS: There is increased volume loss on the left consistent with accumulation of additional pleural fluid. The aerated portion of the left lung exhibits mildly increased density. The right lung is clear. The cardiac silhouette where visualized is normal. The pulmonary vascularity is not engorged. The trachea is midline. The bony thorax exhibits no acute abnormality.  IMPRESSION: Increasing volume of pleural fluid on the left. The left base remains dense and may obscure pneumonia or other pathology.   Electronically Signed   By: David  [place]   On: 12/17/2013 10:58   Dg Chest 2 View  12/16/2013   CLINICAL DATA:  Shortness of breath. History of hepatic encephalopathy, hypertension, diabetes and COPD  EXAM: CHEST  2 VIEW  COMPARISON:  12/06/2010; 09/07/2008; chest CT - 12/06/2010  FINDINGS: Interval development of a small layering left-sided effusion with associated left mid and lower lung heterogeneous/ consolidative opacities. There is partial obscuration of the left heart border. Otherwise, grossly unchanged enlarged cardiac silhouette and mediastinal contours. Atherosclerotic plaque within the thoracic aorta. Mild pulmonary venous congestion without frank evidence of edema. Grossly unchanged right basilar opacities favored to represent atelectasis. Unchanged bones. Post cholecystectomy.  IMPRESSION: 1. Interval development of a small layering left-sided effusion with associated left mid and lower lung opacities, atelectasis versus infiltrate. A follow-up chest radiograph in 4 to 6 weeks after treatment is recommended to ensure resolution. 2. Pulmonary venous congestion without frank evidence of edema.   Electronically Signed   By: Simonne Come M.D.   On: 12/16/2013 16:44   Ct Chest W Contrast  12/22/2013   CLINICAL DATA:  Shortness of breath, fatty liver, ascites, left pleural effusion  EXAM: CT CHEST WITH CONTRAST  TECHNIQUE: Multidetector CT imaging of the chest was performed  during intravenous contrast administration.  CONTRAST:  80mL OMNIPAQUE IOHEXOL 300 MG/ML  SOLN  COMPARISON:  11/19/2010  FINDINGS: The central airways  are patent. There is a moderate left pleural effusion with compressive atelectasis involving the entirety of the left lower lobe. There is no right pleural effusion. There is no pneumothorax. There are mild bilateral emphysematous changes.  There are no pathologically enlarged axillary, hilar or mediastinal lymph nodes.  The heart size is normal. There is no pericardial effusion. The thoracic aorta is normal in caliber. There is coronary artery atherosclerosis in the LAD. There is thoracic aortic atherosclerosis.  Review of bone windows demonstrates no focal lytic or sclerotic lesions.  Limited non-contrast images of the upper abdomen were obtained. The adrenal glands appear normal. There is perihepatic ascites. There is a micronodular contour of the liver concerning for cirrhosis.  IMPRESSION: 1. Moderate left pleural effusion with compressive atelectasis involving the entirety of the left lower lobe. 2. Partially visualized liver with findings concerning for cirrhosis. Moderate perihepatic ascites.   Electronically Signed   By: Elige Ko   On: 12/22/2013 16:37   US Paracentesis  12/29/2013   INDICATION: Cirrhosis, recurrent ascites, request for paracentesis  EXAM: ULTRASOUND-GUIDED PARACENTESIS  COMPARISON:  12/26/13.  MEDICATIONS: None.  COMPLICATIONS: None immediate  TECHNIQUE: Informed written consent was obtained from the patient after a discussion of the risks, benefits and alternatives to treatment. A timeout was performed prior to the initiation of the procedure.  Initial ultrasound scanning demonstrates a large amount of ascites within the left lower abdominal quadrant. The right lower abdomen was prepped and draped in the usual sterile fashion. 1% lidocaine with epinephrine was used for local anesthesia. Under direct ultrasound guidance, a 19 gauge,  10-cm, Yueh catheter was introduced. An ultrasound image was saved for documentation purposed. The paracentesis was performed. The catheter was removed and a dressing was applied. The patient tolerated the procedure well without immediate post procedural complication.  FINDINGS: A total of approximately 700 ml liters of serous fluid was removed. Samples were sent to the laboratory as requested by the clinical team.  IMPRESSION: Successful ultrasound-guided paracentesis yielding 700 ml liters of peritoneal fluid.  Read By:  Pattricia Boss PA-C   Electronically Signed   By: Malachy Moan M.D.   On: 12/29/2013 12:08   US Paracentesis  12/26/2013   INDICATION: Cirrhosis, recurrent ascites, request for paracentesis.  EXAM: ULTRASOUND-GUIDED PARACENTESIS  COMPARISON:  None.  MEDICATIONS: None.  COMPLICATIONS: None immediate  TECHNIQUE: Informed written consent was obtained from the patient after a discussion of the risks, benefits and alternatives to treatment. A timeout was performed prior to the initiation of the procedure.  Initial ultrasound scanning demonstrates a large amount of ascites within the right lower abdominal quadrant. The right lower abdomen was prepped and draped in the usual sterile fashion. 1% lidocaine with epinephrine was used for local anesthesia. Under direct ultrasound guidance, a 19 gauge, 10-cm, Yueh catheter was introduced. An ultrasound image was saved for documentation purposed. The paracentesis was performed. The catheter was removed and a dressing was applied. The patient tolerated the procedure well without immediate post procedural complication.  FINDINGS: A total of approximately 3 liters of serous fluid was removed.  IMPRESSION: Successful ultrasound-guided paracentesis yielding 3 liters of peritoneal fluid.  Read By:  Pattricia Boss PA-C   Electronically Signed   By: Gilmer Mor D.O.   On: 12/26/2013 11:25   US Paracentesis  12/16/2013   CLINICAL DATA:  Cirrhosis, recurrent  ascites. Request is made for therapeutic paracentesis.  EXAM: ULTRASOUND GUIDED THERAPEUTIC PARACENTESIS  COMPARISON:  PRIOR PARACENTESIS ON 12/13/2013  PROCEDURE: An  ultrasound guided paracentesis was thoroughly discussed with the patient and questions answered. The benefits, risks, alternatives and complications were also discussed. The patient understands and wishes to proceed with the procedure. Written consent was obtained.  Ultrasound was performed to localize and mark an adequate pocket of fluid in the right mid to lower quadrant of the abdomen. The area was then prepped and draped in the normal sterile fashion. 1% Lidocaine was used for local anesthesia. Under ultrasound guidance a 19 gauge Yueh catheter was introduced. Paracentesis was performed. The catheter was removed and a dressing applied.  Complications: None.  FINDINGS: A total of approximately 1.9 liters of yellow fluid was removed.  IMPRESSION: Successful ultrasound guided therapeutic paracentesis yielding 1.9 liters of ascites.  Read by: Jeananne Rama, PA-C   Electronically Signed   By: Oley Balm M.D.   On: 12/16/2013 13:50   US Paracentesis  12/13/2013   CLINICAL DATA:  Cirrhosis, recurrent ascites. Request is made for therapeutic paracentesis.  EXAM: ULTRASOUND GUIDED THERAPEUTIC PARACENTESIS  COMPARISON:  PRIOR PARACENTESIS ON 04/21/2013  PROCEDURE: An ultrasound guided paracentesis was thoroughly discussed with the patient and questions answered. The benefits, risks, alternatives and complications were also discussed. The patient understands and wishes to proceed with the procedure. Written consent was obtained.  Ultrasound was performed to localize and mark an adequate pocket of fluid in the left lower quadrant of the abdomen. The area was then prepped and draped in the normal sterile fashion. 1% Lidocaine was used for local anesthesia. Under ultrasound guidance a 19 gauge Yueh catheter was introduced. Paracentesis was performed. The  catheter was removed and a dressing applied.  Complications: None.  FINDINGS: A total of approximately 2 liters of yellow fluid was removed.  IMPRESSION: Successful ultrasound guided therapeutic paracentesis yielding 2 liters of ascites.  Read by: Jeananne Rama, PA-C   Electronically Signed   By: Gilmer Mor O.D.   On: 12/13/2013 15:12   Dg Abd 2 Views  12/16/2013   CLINICAL DATA:  Abdominal distention, pain, nausea, vomiting and constipation. Initial encounter appear  EXAM: ABDOMEN - 2 VIEW  COMPARISON:  Ultrasound-guided paracentesis -12/16/2013  FINDINGS: Nonobstructive bowel gas pattern. No pneumoperitoneum, pneumatosis or portal venous gas.  Limited visualization of lower thorax demonstrates small to moderate size left-sided effusion with associated left basilar heterogeneous/ consolidative opacities.  Post cholecystectomy. No definite abnormal intra-abdominal calcifications.  No acute osseus abnormalities.  IMPRESSION: 1. Nonobstructive bowel-gas pattern 2. Note is made of a small to moderate-sized left-sided pleural effusion with associated left basilar heterogeneous/consolidative opacities - atelectasis versus infiltrate. Further evaluation with PA and lateral chest radiograph could be performed as clinically indicated. These results will be called to the ordering clinician or representative by the Radiologist Assistant, and communication documented in the PACS or zVision Dashboard.   Electronically Signed   By: Simonne Come M.D.   On: 12/16/2013 14:00   US Thoracentesis Asp Pleural Space W/img Guide  12/28/2013   INDICATION: Symptomatic recurrent left sided pleural effusion  EXAM: US THORACENTESIS ASP PLEURAL SPACE W/IMG GUIDE  COMPARISON:  Previous 12/23/13 1.6 liters amber fluid.  MEDICATIONS: None  COMPLICATIONS: None immediate  TECHNIQUE: Informed written consent was obtained from the patient after a discussion of the risks, benefits and alternatives to treatment. A timeout was performed prior to  the initiation of the procedure.  Initial ultrasound scanning demonstrates a left pleural effusion. The lower chest was prepped and draped in the usual sterile fashion. 1% lidocaine was used for local anesthesia.  Under direct ultrasound guidance, a 19 gauge, 10-cm, Yueh catheter was introduced. An ultrasound image was saved for documentation purposes. The thoracentesis was performed. The catheter was removed and a dressing was applied. The patient tolerated the procedure well without immediate post procedural complication. The patient was escorted to have an upright chest radiograph.  FINDINGS: A total of approximately 1.4 liters of bloody fluid was removed.  IMPRESSION: Successful ultrasound-guided left sided thoracentesis yielding 1.4 liters of pleural fluid.  Read By:  Pattricia BossKoreen Morgan PA-C   Electronically Signed   By: Malachy MoanHeath  McCullough M.D.   On: 12/28/2013 14:37   [place] Thoracentesis Asp Pleural Space W/img Guide  12/23/2013   INDICATION: Symptomatic left sided pleural effusion, patient with history of cirrhosis and recurrent left pleural effusion.  EXAM: US THORACENTESIS ASP PLEURAL SPACE W/IMG GUIDE  COMPARISON:  12/19/13 1.2 liters  MEDICATIONS: None  COMPLICATIONS: None immediate  TECHNIQUE: Informed written consent was obtained from the patient after a discussion of the risks, benefits and alternatives to treatment. A timeout was performed prior to the initiation of the procedure.  Initial ultrasound scanning demonstrates a left pleural effusion. The lower chest was prepped and draped in the usual sterile fashion. 1% lidocaine was used for local anesthesia.  Under direct ultrasound guidance, a 19 gauge, 7-cm, Yueh catheter was introduced. An ultrasound image was saved for documentation purposes. The thoracentesis was performed. The catheter was removed and a dressing was applied. The patient tolerated the procedure well without immediate post procedural complication. The patient was escorted to have an  upright chest radiograph.  FINDINGS: A total of approximately 1.6 liters of serous/amber fluid was removed. Requested samples were sent to the laboratory.  IMPRESSION: Successful ultrasound-guided left sided thoracentesis yielding 1.6 liters liters of pleural fluid.  Read By:  Pattricia BossKoreen Morgan PA-C   Electronically Signed   By: Irish LackGlenn  Yamagata M.D.   On: 12/23/2013 14:49   [place] Thoracentesis Asp Pleural Space W/img Guide  12/19/2013   CLINICAL DATA:  Cirrhosis, shortness of breath, left pleural effusion, request for thoracentesis.  EXAM: ULTRASOUND GUIDED DIAGNOSTIC AND THERAPEUTIC THORACENTESIS  COMPARISON:  None.  PROCEDURE: An ultrasound guided thoracentesis was thoroughly discussed with the patient and questions answered. The benefits, risks, alternatives and complications were also discussed. The patient understands and wishes to proceed with the procedure. Written consent was obtained.  Ultrasound was performed to localize and mark an adequate pocket of fluid in the left chest. The area was then prepped and draped in the normal sterile fashion. 1% Lidocaine was used for local anesthesia. Under ultrasound guidance a 19 gauge Yueh catheter was introduced. Thoracentesis was performed. The catheter was removed and a dressing applied.  Complications:  None immediate.  FINDINGS: A total of approximately 1.2 liters of yellow/amber fluid was removed. A fluid sample was sent for laboratory analysis.  IMPRESSION: Successful ultrasound guided left thoracentesis yielding 1.2 liters of pleural fluid.  Read By:  Pattricia BossKoreen Morgan PA-C   Electronically Signed   By: Simonne ComeJohn  Watts M.D.   On: 12/19/2013 10:52    Microbiology: Recent Results (from the past 240 hour(s))  BODY FLUID CULTURE     Status: None   Collection Time    12/23/13  1:39 PM      Result Value Ref Range Status   Specimen Description PLEURAL   Final   Special Requests NONE   Final   Gram Stain     Final   Value: RARE WBC PRESENT, PREDOMINANTLY MONONUCLEAR  NO ORGANISMS SEEN     Performed at [facility]   Culture     Final   Value: NO GROWTH 4 DAYS     Performed at [facility]   Report Status 12/27/2013 FINAL   Final     Labs: Basic Metabolic Panel:  Recent Labs Lab 12/27/13 0418 12/28/13 0350 12/29/13 0543 12/30/13 0419 12/31/13 0512  NA 132* 139 134* 134* 136*  K 3.5* 3.8 3.8 3.7 3.6*  CL 96 101 98 97 98  CO2 26 27 29 28 29   GLUCOSE 163* 61* 94 100* 93  BUN 14 11 11 12 13   CREATININE 0.80 0.54 0.61 0.59 0.60  CALCIUM 8.0* 8.3* 8.3* 8.4 8.6   Liver Function Tests:  Recent Labs Lab 12/27/13 0418 12/28/13 0350 12/29/13 0543 12/30/13 0419 12/31/13 0512  AST 53* 58* 56* 56* 56*  ALT 29 32 30 31 30   ALKPHOS 108 102 104 106 109  BILITOT 2.5* 3.1* 3.0* 3.2* 3.2*  PROT 6.8 7.1 7.1 7.3 7.3  ALBUMIN 1.9* 2.0* 2.0* 2.1* 2.0*   No results found for this basename: LIPASE, AMYLASE,  in the last 168 hours No results found for this basename: AMMONIA,  in the last 168 hours CBC:  Recent Labs Lab 12/25/13 0450 12/26/13 0435 12/27/13 0418  WBC 9.4 9.3 7.6  HGB 15.7* 15.3* 14.3  HCT 44.6 43.4 41.9  MCV 100.0 99.8 102.2*  PLT 127* 132* 113*   Cardiac Enzymes: No results found for this basename: CKTOTAL, CKMB, CKMBINDEX, TROPONINI,  in the last 168 hours BNP: BNP (last 3 results) No results found for this basename: PROBNP,  in the last 8760 hours CBG:  Recent Labs Lab 12/30/13 1230 12/30/13 1730 12/30/13 2056 12/31/13 0758 12/31/13 1224  GLUCAP 221* 212* 152* 96 138*       Signed:  Shandie Bertz  [facility] 12/31/2013, 2:58 PM

## 2013-12-31 NOTE — Evaluation (Signed)
Physical Therapy Evaluation Patient Details Name: Michelle FennelVicky S Galvan MRN: [id number] DOB: September 24, 1955 Today's Date: 12/31/2013   History of Present Illness  Patient is 58 yo  admitted 10/08 who was recently discharged from the hospital on 12/19/2013 after patient underwent paracentesis as well as thoracentesis for ascites as well as left pleural effusion. patient  started having shortness of breath on exertion.  Patient has a history of [name], and is [facility]admitted with recurrent pleural effusion.  Clinical Impression  Patient ambulated  Around unit, spouse reports patient requires encouragem,ent to ambulate. Patient will benefit from PT while in acute care to address problems listed in note below.   Follow Up Recommendations No PT follow up;Supervision - Intermittent    Equipment Recommendations  None recommended by PT    Recommendations for Other Services       Precautions / Restrictions Precautions Precautions: Fall Restrictions Weight Bearing Restrictions: No      Mobility  Bed Mobility Overal bed mobility: Independent                Transfers Overall transfer level: Independent                  Ambulation/Gait Ambulation/Gait assistance: Supervision Ambulation Distance (Feet): 400 Feet Assistive device: None Gait Pattern/deviations: Step-through pattern;Drifts right/left   Gait velocity interpretation: at or above normal speed for age/gender General Gait Details: noted to sway at times when making turns, doesnot lose balance enough  to stagger.   Stairs            Wheelchair Mobility    Modified Rankin (Stroke Patients Only)       Balance   Sitting-balance support: Feet supported;No upper extremity supported Sitting balance-Leahy Scale: Normal     Standing balance support: During functional activity;No upper extremity supported Standing balance-Leahy Scale: Fair                                Pertinent Vitals/Pain Pain Assessment: No/denies pain    Home Living Family/patient expects to be discharged to:: Private residence Living Arrangements: Spouse/significant other Available Help at Discharge: Family Type of Home: House Home Access: Stairs to enter Entrance Stairs-Rails: None Secretary/administratorntrance Stairs-Number of Steps: 4 Home Layout: One level Home Equipment: None      Prior Function Level of Independence: Independent               Hand Dominance        Extremity/Trunk Assessment   Upper Extremity Assessment: Generalized weakness           Lower Extremity Assessment: Generalized weakness      Cervical / Trunk Assessment: Normal  Communication   Communication: No difficulties  Cognition Arousal/Alertness: Awake/alert Behavior During Therapy: WFL for tasks assessed/performed Overall Cognitive Status: Within Functional Limits for tasks assessed                      General Comments      Exercises General Exercises - Upper Extremity- with orange theraband Shoulder Flexion: AROM;Strengthening;Both;Seated;10 reps Shoulder Extension: AROM;Strengthening;10 reps;Seated;Both Shoulder ABduction: Both;10 reps Shoulder Horizontal ABduction: AROM;Both;10 reps;Seated;Strengthening Elbow Flexion: AROM;Strengthening;Both;10 reps;Seated Elbow Extension: AROM;Strengthening;Both;Other reps (comment) General Exercises - Lower Extremity Long Arc Quad: AROM;Both;20 reps;Seated Hip ABduction/ADduction: AROM;Both;20 reps;Seated Hip Flexion/Marching: AROM;Both;20 reps;Seated      Assessment/Plan    PT Assessment Patient needs continued PT services  PT Diagnosis Generalized weakness;Difficulty walking  PT Problem List Decreased strength;Decreased balance;Decreased activity tolerance;Decreased mobility  PT Treatment Interventions Gait training;Functional mobility training;Therapeutic activities;Therapeutic exercise   PT Goals (Current goals can be  found in the Care Plan section) Acute Rehab PT Goals Patient Stated Goal: I am ready to go home PT Goal Formulation: With patient/family Time For Goal Achievement: 01/14/14 Potential to Achieve Goals: Good    Frequency Min 3X/week   Barriers to discharge        Co-evaluation               End of Session Equipment Utilized During Treatment: Gait belt Activity Tolerance: Patient tolerated treatment well Patient left: in chair;with call bell/phone within reach;with family/visitor present Nurse Communication: Mobility status         Time: [id number] PT Time Calculation (min): 24 min   Charges:   PT Evaluation $Initial PT Evaluation Tier I: 1 Procedure PT Treatments $Gait Training: 8-22 mins $Therapeutic Exercise: 8-22 mins   PT G Codes:          Rada HayHill, Hien Cunliffe Elizabeth 12/31/2013, 10:11 AM Blanchard KelchKaren Deldrick Linch PT [phone]

## 2014-01-02 ENCOUNTER — Other Ambulatory Visit (HOSPITAL_COMMUNITY): Payer: Self-pay | Admitting: Gastroenterology

## 2014-01-02 ENCOUNTER — Ambulatory Visit (HOSPITAL_COMMUNITY)
Admission: RE | Admit: 2014-01-02 | Discharge: 2014-01-02 | Disposition: A | Discharge status: Home / Self Care | Payer: Managed Care, Other (non HMO) | Source: Ambulatory Visit | Attending: Radiology | Admitting: Radiology

## 2014-01-02 ENCOUNTER — Ambulatory Visit (HOSPITAL_COMMUNITY)
Admission: RE | Admit: 2014-01-02 | Discharge: 2014-01-02 | Disposition: A | Discharge status: Home / Self Care | Payer: Managed Care, Other (non HMO) | Source: Ambulatory Visit | Attending: Gastroenterology | Admitting: Gastroenterology

## 2014-01-02 DIAGNOSIS — K7460 Unspecified cirrhosis of liver: Secondary | ICD-10-CM

## 2014-01-02 DIAGNOSIS — K859 Acute pancreatitis without necrosis or infection, unspecified: Secondary | ICD-10-CM

## 2014-01-02 DIAGNOSIS — R180 Malignant ascites: Secondary | ICD-10-CM | POA: Diagnosis not present

## 2014-01-02 DIAGNOSIS — J918 Pleural effusion in other conditions classified elsewhere: Secondary | ICD-10-CM

## 2014-01-02 DIAGNOSIS — R188 Other ascites: Secondary | ICD-10-CM

## 2014-01-02 MED ORDER — LIDOCAINE HCL (PF) 1 % IJ SOLN
INTRAMUSCULAR | Status: AC
  Filled 2014-01-02: qty 10

## 2014-01-02 NOTE — Procedures (Signed)
Successful US guided left thoracentesis. Yielded 1.7L of blood tinged, pleural fluid. Pt tolerated procedure well. No immediate complications.  Specimen was not sent for labs. CXR ordered.  Brayton ElBRUNING, Princessa Lesmeister PA-C 01/02/2014 3:06 PM

## 2014-01-03 ENCOUNTER — Ambulatory Visit (HOSPITAL_COMMUNITY): Payer: Managed Care, Other (non HMO)

## 2014-02-12 ENCOUNTER — Inpatient Hospital Stay (HOSPITAL_COMMUNITY): Payer: Managed Care, Other (non HMO)

## 2014-02-12 ENCOUNTER — Emergency Department (HOSPITAL_COMMUNITY): Payer: Managed Care, Other (non HMO)

## 2014-02-12 ENCOUNTER — Encounter (HOSPITAL_COMMUNITY): Payer: Self-pay | Admitting: Emergency Medicine

## 2014-02-12 ENCOUNTER — Inpatient Hospital Stay (HOSPITAL_COMMUNITY)
Admission: EM | Admit: 2014-02-12 | Discharge: 2014-03-17 | DRG: 871 | Disposition: E | Payer: Managed Care, Other (non HMO) | Attending: Pulmonary Disease | Admitting: Pulmonary Disease

## 2014-02-12 DIAGNOSIS — E785 Hyperlipidemia, unspecified: Secondary | ICD-10-CM | POA: Diagnosis present

## 2014-02-12 DIAGNOSIS — R410 Disorientation, unspecified: Secondary | ICD-10-CM

## 2014-02-12 DIAGNOSIS — Z7682 Awaiting organ transplant status: Secondary | ICD-10-CM

## 2014-02-12 DIAGNOSIS — R34 Anuria and oliguria: Secondary | ICD-10-CM | POA: Diagnosis present

## 2014-02-12 DIAGNOSIS — N289 Disorder of kidney and ureter, unspecified: Secondary | ICD-10-CM | POA: Diagnosis present

## 2014-02-12 DIAGNOSIS — Y95 Nosocomial condition: Secondary | ICD-10-CM | POA: Diagnosis present

## 2014-02-12 DIAGNOSIS — J90 Pleural effusion, not elsewhere classified: Secondary | ICD-10-CM | POA: Diagnosis present

## 2014-02-12 DIAGNOSIS — R109 Unspecified abdominal pain: Secondary | ICD-10-CM

## 2014-02-12 DIAGNOSIS — Z66 Do not resuscitate: Secondary | ICD-10-CM | POA: Diagnosis present

## 2014-02-12 DIAGNOSIS — R040 Epistaxis: Secondary | ICD-10-CM | POA: Diagnosis present

## 2014-02-12 DIAGNOSIS — R140 Abdominal distension (gaseous): Secondary | ICD-10-CM | POA: Diagnosis present

## 2014-02-12 DIAGNOSIS — E1042 Type 1 diabetes mellitus with diabetic polyneuropathy: Secondary | ICD-10-CM | POA: Diagnosis present

## 2014-02-12 DIAGNOSIS — J948 Other specified pleural conditions: Secondary | ICD-10-CM | POA: Diagnosis present

## 2014-02-12 DIAGNOSIS — I471 Supraventricular tachycardia: Secondary | ICD-10-CM | POA: Diagnosis not present

## 2014-02-12 DIAGNOSIS — R17 Unspecified jaundice: Secondary | ICD-10-CM

## 2014-02-12 DIAGNOSIS — J449 Chronic obstructive pulmonary disease, unspecified: Secondary | ICD-10-CM | POA: Diagnosis present

## 2014-02-12 DIAGNOSIS — K7581 Nonalcoholic steatohepatitis (NASH): Secondary | ICD-10-CM | POA: Diagnosis present

## 2014-02-12 DIAGNOSIS — F329 Major depressive disorder, single episode, unspecified: Secondary | ICD-10-CM | POA: Diagnosis present

## 2014-02-12 DIAGNOSIS — R6521 Severe sepsis with septic shock: Secondary | ICD-10-CM | POA: Diagnosis present

## 2014-02-12 DIAGNOSIS — R188 Other ascites: Secondary | ICD-10-CM | POA: Diagnosis present

## 2014-02-12 DIAGNOSIS — J9601 Acute respiratory failure with hypoxia: Secondary | ICD-10-CM | POA: Diagnosis present

## 2014-02-12 DIAGNOSIS — D689 Coagulation defect, unspecified: Secondary | ICD-10-CM | POA: Diagnosis present

## 2014-02-12 DIAGNOSIS — R0689 Other abnormalities of breathing: Secondary | ICD-10-CM | POA: Diagnosis present

## 2014-02-12 DIAGNOSIS — K7200 Acute and subacute hepatic failure without coma: Secondary | ICD-10-CM | POA: Diagnosis present

## 2014-02-12 DIAGNOSIS — Z794 Long term (current) use of insulin: Secondary | ICD-10-CM

## 2014-02-12 DIAGNOSIS — J189 Pneumonia, unspecified organism: Secondary | ICD-10-CM | POA: Diagnosis not present

## 2014-02-12 DIAGNOSIS — K652 Spontaneous bacterial peritonitis: Secondary | ICD-10-CM | POA: Diagnosis present

## 2014-02-12 DIAGNOSIS — K219 Gastro-esophageal reflux disease without esophagitis: Secondary | ICD-10-CM | POA: Diagnosis present

## 2014-02-12 DIAGNOSIS — E1069 Type 1 diabetes mellitus with other specified complication: Secondary | ICD-10-CM | POA: Diagnosis present

## 2014-02-12 DIAGNOSIS — E872 Acidosis: Secondary | ICD-10-CM | POA: Diagnosis present

## 2014-02-12 DIAGNOSIS — I10 Essential (primary) hypertension: Secondary | ICD-10-CM | POA: Diagnosis present

## 2014-02-12 DIAGNOSIS — A419 Sepsis, unspecified organism: Secondary | ICD-10-CM | POA: Diagnosis present

## 2014-02-12 DIAGNOSIS — Z86010 Personal history of colonic polyps: Secondary | ICD-10-CM

## 2014-02-12 DIAGNOSIS — Z7982 Long term (current) use of aspirin: Secondary | ICD-10-CM | POA: Diagnosis not present

## 2014-02-12 DIAGNOSIS — K769 Liver disease, unspecified: Secondary | ICD-10-CM

## 2014-02-12 DIAGNOSIS — E119 Type 2 diabetes mellitus without complications: Secondary | ICD-10-CM

## 2014-02-12 DIAGNOSIS — Z87891 Personal history of nicotine dependence: Secondary | ICD-10-CM

## 2014-02-12 DIAGNOSIS — R4182 Altered mental status, unspecified: Secondary | ICD-10-CM | POA: Diagnosis present

## 2014-02-12 DIAGNOSIS — E875 Hyperkalemia: Secondary | ICD-10-CM | POA: Diagnosis present

## 2014-02-12 DIAGNOSIS — K922 Gastrointestinal hemorrhage, unspecified: Secondary | ICD-10-CM

## 2014-02-12 DIAGNOSIS — K7682 Hepatic encephalopathy: Secondary | ICD-10-CM

## 2014-02-12 DIAGNOSIS — K7290 Hepatic failure, unspecified without coma: Secondary | ICD-10-CM

## 2014-02-12 DIAGNOSIS — J918 Pleural effusion in other conditions classified elsewhere: Secondary | ICD-10-CM | POA: Diagnosis present

## 2014-02-12 DIAGNOSIS — Z452 Encounter for adjustment and management of vascular access device: Secondary | ICD-10-CM

## 2014-02-12 DIAGNOSIS — K921 Melena: Secondary | ICD-10-CM | POA: Diagnosis present

## 2014-02-12 DIAGNOSIS — H3530 Unspecified macular degeneration: Secondary | ICD-10-CM | POA: Diagnosis present

## 2014-02-12 DIAGNOSIS — Z515 Encounter for palliative care: Secondary | ICD-10-CM

## 2014-02-12 DIAGNOSIS — Z79899 Other long term (current) drug therapy: Secondary | ICD-10-CM

## 2014-02-12 DIAGNOSIS — G9340 Encephalopathy, unspecified: Secondary | ICD-10-CM | POA: Diagnosis present

## 2014-02-12 LAB — COMPREHENSIVE METABOLIC PANEL
ALT: 49 U/L — ABNORMAL HIGH (ref 0–35)
ANION GAP: 14 (ref 5–15)
AST: 61 U/L — ABNORMAL HIGH (ref 0–37)
Albumin: 2.3 g/dL — ABNORMAL LOW (ref 3.5–5.2)
Alkaline Phosphatase: 100 U/L (ref 39–117)
BUN: 18 mg/dL (ref 6–23)
CALCIUM: 9.6 mg/dL (ref 8.4–10.5)
CO2: 20 mEq/L (ref 19–32)
Chloride: 99 mEq/L (ref 96–112)
Creatinine, Ser: 0.85 mg/dL (ref 0.50–1.10)
GFR calc non Af Amer: 73 mL/min — ABNORMAL LOW (ref >90)
GFR, EST AFRICAN AMERICAN: 85 mL/min — AB (ref >90)
GLUCOSE: 238 mg/dL — AB (ref 70–99)
Potassium: 4.5 mEq/L (ref 3.7–5.3)
Sodium: 133 mEq/L — ABNORMAL LOW (ref 137–147)
TOTAL PROTEIN: 8.1 g/dL (ref 6.0–8.3)
Total Bilirubin: 7.3 mg/dL — ABNORMAL HIGH (ref 0.3–1.2)

## 2014-02-12 LAB — CBC
HEMATOCRIT: 42.3 % (ref 36.0–46.0)
HEMOGLOBIN: 15.2 g/dL — AB (ref 12.0–15.0)
MCH: 35.5 pg — AB (ref 26.0–34.0)
MCHC: 35.9 g/dL (ref 30.0–36.0)
MCV: 98.8 fL (ref 78.0–100.0)
Platelets: 123 10*3/uL — ABNORMAL LOW (ref 150–400)
RBC: 4.28 MIL/uL (ref 3.87–5.11)
RDW: 16.4 % — ABNORMAL HIGH (ref 11.5–15.5)
WBC: 7.4 10*3/uL (ref 4.0–10.5)

## 2014-02-12 LAB — URINALYSIS, ROUTINE W REFLEX MICROSCOPIC
BILIRUBIN URINE: NEGATIVE
Glucose, UA: NEGATIVE mg/dL
Hgb urine dipstick: NEGATIVE
Ketones, ur: 15 mg/dL — AB
NITRITE: POSITIVE — AB
PROTEIN: NEGATIVE mg/dL
Specific Gravity, Urine: 1.036 — ABNORMAL HIGH (ref 1.005–1.030)
UROBILINOGEN UA: 1 mg/dL (ref 0.0–1.0)
pH: 5 (ref 5.0–8.0)

## 2014-02-12 LAB — BLOOD GAS, ARTERIAL
Acid-base deficit: 3.5 mmol/L — ABNORMAL HIGH (ref 0.0–2.0)
BICARBONATE: 18.4 meq/L — AB (ref 20.0–24.0)
Drawn by: 11249
FIO2: 0.21 %
O2 SAT: 90.6 %
PATIENT TEMPERATURE: 98.7
PH ART: 7.465 — AB (ref 7.350–7.450)
TCO2: 16.1 mmol/L (ref 0–100)
pCO2 arterial: 25.9 mmHg — ABNORMAL LOW (ref 35.0–45.0)
pO2, Arterial: 60.2 mmHg — ABNORMAL LOW (ref 80.0–100.0)

## 2014-02-12 LAB — APTT: aPTT: 49 seconds — ABNORMAL HIGH (ref 24–37)

## 2014-02-12 LAB — AMMONIA: Ammonia: 67 umol/L — ABNORMAL HIGH (ref 11–60)

## 2014-02-12 LAB — GLUCOSE, CAPILLARY: GLUCOSE-CAPILLARY: 198 mg/dL — AB (ref 70–99)

## 2014-02-12 LAB — URINE MICROSCOPIC-ADD ON

## 2014-02-12 LAB — LIPASE, BLOOD: LIPASE: 60 U/L — AB (ref 11–59)

## 2014-02-12 LAB — PROTIME-INR
INR: 1.88 — AB (ref 0.00–1.49)
Prothrombin Time: 21.8 seconds — ABNORMAL HIGH (ref 11.6–15.2)

## 2014-02-12 LAB — LACTIC ACID, PLASMA: LACTIC ACID, VENOUS: 3.5 mmol/L — AB (ref 0.5–2.2)

## 2014-02-12 LAB — POC OCCULT BLOOD, ED: Fecal Occult Bld: POSITIVE — AB

## 2014-02-12 LAB — CBG MONITORING, ED: Glucose-Capillary: 199 mg/dL — ABNORMAL HIGH (ref 70–99)

## 2014-02-12 LAB — I-STAT TROPONIN, ED: TROPONIN I, POC: 0 ng/mL (ref 0.00–0.08)

## 2014-02-12 LAB — MRSA PCR SCREENING: MRSA by PCR: NEGATIVE

## 2014-02-12 MED ORDER — VANCOMYCIN HCL IN DEXTROSE 1-5 GM/200ML-% IV SOLN
1000.0000 mg | Freq: Once | INTRAVENOUS | Status: AC
  Administered 2014-02-12: 1000 mg via INTRAVENOUS
  Filled 2014-02-12: qty 200

## 2014-02-12 MED ORDER — DULOXETINE HCL 30 MG PO CPEP: 60.0000 mg | ORAL_CAPSULE | Freq: Every morning | ORAL | Status: DC

## 2014-02-12 MED ORDER — DIPHENHYDRAMINE HCL 25 MG PO CAPS: 25.0000 mg | ORAL_CAPSULE | Freq: Four times a day (QID) | ORAL | Status: DC | PRN

## 2014-02-12 MED ORDER — PROPRANOLOL HCL 10 MG PO TABS
5.0000 mg | ORAL_TABLET | Freq: Two times a day (BID) | ORAL | Status: DC
  Filled 2014-02-12 (×3): qty 0.5

## 2014-02-12 MED ORDER — AZTREONAM 2 G IJ SOLR
2.0000 g | Freq: Three times a day (TID) | INTRAMUSCULAR | Status: DC
  Administered 2014-02-13 – 2014-02-14 (×5): 2 g via INTRAVENOUS
  Filled 2014-02-12 (×5): qty 2

## 2014-02-12 MED ORDER — ONDANSETRON HCL 4 MG/2ML IJ SOLN: 4.0000 mg | Freq: Four times a day (QID) | INTRAMUSCULAR | Status: DC | PRN

## 2014-02-12 MED ORDER — DEXTROSE 5 % IV SOLN
2.0000 g | Freq: Once | INTRAVENOUS | Status: AC
  Administered 2014-02-12: 2 g via INTRAVENOUS
  Filled 2014-02-12: qty 2

## 2014-02-12 MED ORDER — LEVOFLOXACIN IN D5W 750 MG/150ML IV SOLN: 750.0000 mg | INTRAVENOUS | Status: DC

## 2014-02-12 MED ORDER — LORAZEPAM 2 MG/ML IJ SOLN
0.5000 mg | Freq: Once | INTRAMUSCULAR | Status: AC
  Administered 2014-02-12: 0.5 mg via INTRAVENOUS

## 2014-02-12 MED ORDER — RIFAXIMIN 550 MG PO TABS: 550.0000 mg | ORAL_TABLET | Freq: Two times a day (BID) | ORAL | Status: DC

## 2014-02-12 MED ORDER — SODIUM CHLORIDE 0.9 % IV SOLN: 250.0000 mL | INTRAVENOUS | Status: DC | PRN

## 2014-02-12 MED ORDER — MORPHINE SULFATE 2 MG/ML IJ SOLN
1.0000 mg | INTRAMUSCULAR | Status: DC | PRN
Start: 2014-02-12 — End: 2014-02-13
  Administered 2014-02-12 (×2): 1 mg via INTRAVENOUS
  Administered 2014-02-13 (×2): 2 mg via INTRAVENOUS
  Filled 2014-02-12 (×2): qty 1

## 2014-02-12 MED ORDER — RIFAXIMIN 550 MG PO TABS
550.0000 mg | ORAL_TABLET | Freq: Two times a day (BID) | ORAL | Status: DC
  Administered 2014-02-13 – 2014-02-14 (×2): 550 mg via ORAL
  Filled 2014-02-12 (×5): qty 1

## 2014-02-12 MED ORDER — LORAZEPAM 2 MG/ML IJ SOLN
INTRAMUSCULAR | Status: AC
  Filled 2014-02-12: qty 1

## 2014-02-12 MED ORDER — CIPROFLOXACIN IN D5W 400 MG/200ML IV SOLN
400.0000 mg | Freq: Two times a day (BID) | INTRAVENOUS | Status: DC
  Administered 2014-02-13 – 2014-02-14 (×3): 400 mg via INTRAVENOUS
  Filled 2014-02-12 (×3): qty 200

## 2014-02-12 MED ORDER — MORPHINE SULFATE 2 MG/ML IJ SOLN
0.5000 mg | INTRAMUSCULAR | Status: DC | PRN
  Filled 2014-02-12: qty 1

## 2014-02-12 MED ORDER — PANTOPRAZOLE SODIUM 40 MG IV SOLR
40.0000 mg | Freq: Two times a day (BID) | INTRAVENOUS | Status: DC
  Administered 2014-02-12 – 2014-02-14 (×4): 40 mg via INTRAVENOUS
  Filled 2014-02-12 (×4): qty 40

## 2014-02-12 MED ORDER — ONDANSETRON HCL 4 MG/2ML IJ SOLN
4.0000 mg | Freq: Once | INTRAMUSCULAR | Status: AC
  Administered 2014-02-12: 4 mg via INTRAVENOUS
  Filled 2014-02-12: qty 2

## 2014-02-12 MED ORDER — LACTULOSE ENEMA
300.0000 mL | Freq: Once | ORAL | Status: AC
  Administered 2014-02-12: 300 mL via RECTAL
  Filled 2014-02-12: qty 300

## 2014-02-12 MED ORDER — LACTULOSE 10 GM/15ML PO SOLN: 20.0000 g | Freq: Three times a day (TID) | ORAL | Status: DC

## 2014-02-12 MED ORDER — LEVOFLOXACIN IN D5W 750 MG/150ML IV SOLN
750.0000 mg | Freq: Once | INTRAVENOUS | Status: AC
  Administered 2014-02-12: 750 mg via INTRAVENOUS
  Filled 2014-02-12: qty 150

## 2014-02-12 MED ORDER — LACTULOSE 10 GM/15ML PO SOLN
20.0000 g | Freq: Once | ORAL | Status: DC
  Filled 2014-02-12: qty 30

## 2014-02-12 MED ORDER — SODIUM CHLORIDE 0.9 % IJ SOLN
3.0000 mL | Freq: Two times a day (BID) | INTRAMUSCULAR | Status: DC
  Administered 2014-02-12 – 2014-02-13 (×3): 3 mL via INTRAVENOUS

## 2014-02-12 MED ORDER — GABAPENTIN 300 MG PO CAPS: 300.0000 mg | ORAL_CAPSULE | Freq: Every day | ORAL | Status: DC

## 2014-02-12 MED ORDER — SODIUM CHLORIDE 0.9 % IJ SOLN: 3.0000 mL | INTRAMUSCULAR | Status: DC | PRN

## 2014-02-12 MED ORDER — HALOPERIDOL LACTATE 5 MG/ML IJ SOLN: 5.0000 mg | INTRAMUSCULAR | Status: DC | PRN

## 2014-02-12 MED ORDER — ONDANSETRON HCL 4 MG/2ML IJ SOLN
4.0000 mg | INTRAMUSCULAR | Status: AC
  Administered 2014-02-12: 4 mg via INTRAVENOUS
  Filled 2014-02-12: qty 2

## 2014-02-12 MED ORDER — SPIRONOLACTONE 25 MG PO TABS: 100.0000 mg | ORAL_TABLET | Freq: Two times a day (BID) | ORAL | Status: DC

## 2014-02-12 MED ORDER — PROPRANOLOL HCL 10 MG PO TABS: 10.0000 mg | ORAL_TABLET | Freq: Two times a day (BID) | ORAL | Status: DC

## 2014-02-12 MED ORDER — HALOPERIDOL LACTATE 5 MG/ML IJ SOLN
1.0000 mg | INTRAMUSCULAR | Status: DC | PRN
  Administered 2014-02-12: 1 mg via INTRAVENOUS
  Filled 2014-02-12: qty 1

## 2014-02-12 MED ORDER — HALOPERIDOL LACTATE 5 MG/ML IJ SOLN
0.5000 mg | Freq: Three times a day (TID) | INTRAMUSCULAR | Status: DC | PRN
  Administered 2014-02-12: 0.5 mg via INTRAVENOUS
  Filled 2014-02-12 (×2): qty 1

## 2014-02-12 MED ORDER — VANCOMYCIN HCL IN DEXTROSE 1-5 GM/200ML-% IV SOLN
1000.0000 mg | Freq: Two times a day (BID) | INTRAVENOUS | Status: DC
  Administered 2014-02-13: 1000 mg via INTRAVENOUS
  Filled 2014-02-12 (×2): qty 200

## 2014-02-12 MED ORDER — LORAZEPAM 2 MG/ML IJ SOLN
0.5000 mg | Freq: Once | INTRAMUSCULAR | Status: AC
  Administered 2014-02-12: 0.5 mg via INTRAVENOUS
  Filled 2014-02-12: qty 1

## 2014-02-12 MED ORDER — VITAMIN K1 10 MG/ML IJ SOLN
5.0000 mg | Freq: Once | INTRAVENOUS | Status: DC
  Filled 2014-02-12 (×2): qty 0.5

## 2014-02-12 MED ORDER — ONDANSETRON HCL 4 MG PO TABS: 4.0000 mg | ORAL_TABLET | Freq: Four times a day (QID) | ORAL | Status: DC | PRN

## 2014-02-12 NOTE — Progress Notes (Addendum)
ANTIBIOTIC CONSULT NOTE - INITIAL  Pharmacy Consult for Aztreonam, Vancomycin, Cipro Indication: rule out sepsis, SBP  Allergies  Allergen Reactions  . Latex Rash  . Actos [Pioglitazone] Swelling and Other (See Comments)    edema Edema   . Byetta 10 Mcg Pen [Exenatide] Other (See Comments)    Stomach upset  . Crestor [Rosuvastatin] Other (See Comments)    Other reaction(s): Liver Disorder Elevated LFT's Increases LFT  . Lipitor [Atorvastatin] Other (See Comments)    Other reaction(s): Muscle Pain Muscle aches  . Other Other (See Comments)    Stomach upset  . Penicillin G Hives  . Penicillins Hives and Itching    All over the body.    Patient Measurements: Height: 5\' 5"  (165.1 cm) Weight: 183 lb (83.008 kg) IBW/kg (Calculated) : 57  Vital Signs: Temp: 95.5 F (35.3 C) (11/29 1422) Temp Source: Rectal (11/29 1422) BP: 125/56 mmHg (11/29 1422) Pulse Rate: 82 (11/29 1422) Intake/Output from previous day:    Labs:  Recent Labs  11/02/2013 1154  WBC 7.4  HGB 15.2*  PLT 123*  CREATININE 0.85   Estimated Creatinine Clearance: 74.9 mL/min (by C-G formula based on Cr of 0.85). No results for input(s): VANCOTROUGH, VANCOPEAK, VANCORANDOM, GENTTROUGH, GENTPEAK, GENTRANDOM, TOBRATROUGH, TOBRAPEAK, TOBRARND, AMIKACINPEAK, AMIKACINTROU, AMIKACIN in the last 72 hours.   Microbiology: No results found for this or any previous visit (from the past 720 hour(s)).  Medical History: Past Medical History  Diagnosis Date  . Hypertension   . Diabetes mellitus   . Hyperlipidemia   . Fatigue   . Night sweats   . Wears dentures   . Poor appetite   . Retinopathy   . Arthritis   . Rash   . Bruises easily   . Colon polyp   . Hernia   . Rectal bleeding   . Hemorrhoid   . Family history of breast cancer   . Abdominal pain     spasms radiating around to the right side of abdomen   . Shortness of breath   . Night sweats   . Hepatic encephalopathy   . Non-alcoholic fatty  liver disease   . Liver cirrhosis     nonalcoholic  . GERD (gastroesophageal reflux disease)   . Depression   . Anxiety   . COPD (chronic obstructive pulmonary disease)     pt denies this  . Anemia   . IBS (irritable bowel syndrome)   . RLS (restless legs syndrome)   . Macular degeneration   . Memory loss     hepatatic encephlopathy  . Degenerative arthritis   . Diabetes 1.5, managed as type 1   . Neuropathy, peripheral     feet legs    Assessment: [age] yoF presented to ED on 11/29 with AMS as reported by husband.PMH includes diabetes, cirrhosis, encephalopathy, NASH, on [facility], and COPD.S/p thoracentesis on 12/19/13 and paracentesis on 12/16/13.Husband also reports intermittent nosebleed, blood in stool, and lactulose q4h (ammonia = 67) today.Pt reports abdominal pain.Pharmacy is consulted to dose Cipro, aztreonam, and vancomycin for suspected sepsis, SBP.  11/29 >> Levaquin >> 11/29 >> Aztreonam >> 11/29 >> Vanc >>  Today, 04-21-13   Temp: Hypothermic, 95.5  WBCs: 7.4  Renal: SCr 0.85, CrCl ~ 75 ml/min  Lactic acid 3.5  Blood cultures pending   Goal of Therapy:  Vancomycin trough level 15-20 mcg/ml Appropriate abx dosing, eradication of infection.   Plan:   Levofloxacin 750mg  IV x1 given 11/29  Cipro 400mg  IV  q12h starting on 11/30   Aztreonam 2g IV q8h  Vancomycin 1g IV q12h  Measure Vanc trough at steady state.  Follow up renal fxn and culture results.  Lynann Beaverhristine Adrik Khim PharmD, BCPS Pager [phone] 01/31/2014 3:09 PM

## 2014-02-12 NOTE — H&P (Signed)
[facility] History and Physical  [name] ZOX:[id number] DOB: [birth year] DOA: 01/26/2014  Referring physician: Dr Fredderick PhenixBelfi.  PCP: [name],[name], MD   Chief Complaint: AMS.   HPI: [name] is a 58 y.o. female with PMH significant for NASH, Cirrhosis of Liver on [facility], Ascites, recurrent left pleural effusion, she has required thoracentesis and paracentesis. She was brought to the ED by Husband due to worsening confusion, agitation. History was obtain from husband. Patient has been very agitated in the ED, trying to stand up from the bed, removing bear hugger. She was started on restrain. Patient lactulose dose was increase the day prior to admission. She was able to have a large BM today. Per husband patient has been complaining of abdominal pain. He also has notice worsening yellowish color of her eyes and skin today. He notice blood clots in the stool today. She has been having nose bleed.    Review of Systems:  Unable to obtain from patient due to AMS.   Past Medical History  Diagnosis Date  . Hypertension   . Diabetes mellitus   . Hyperlipidemia   . Fatigue   . Night sweats   . Wears dentures   . Poor appetite   . Retinopathy   . Arthritis   . Rash   . Bruises easily   . Colon polyp   . Hernia   . Rectal bleeding   . Hemorrhoid   . Family history of breast cancer   . Abdominal pain     spasms radiating around to the right side of abdomen   . Shortness of breath   . Night sweats   . Hepatic encephalopathy   . Non-alcoholic fatty liver disease   . Liver cirrhosis     nonalcoholic  . GERD (gastroesophageal reflux disease)   . Depression   . Anxiety   . COPD (chronic obstructive pulmonary disease)     pt denies this  . Anemia   . IBS (irritable bowel syndrome)   . RLS (restless legs syndrome)   . Macular degeneration   . Memory loss     hepatatic encephlopathy  . Degenerative arthritis   . Diabetes 1.5, managed as type 1     . Neuropathy, peripheral     feet legs   Past Surgical History  Procedure Laterality Date  . Cholecystectomy  11/28/10  . Liver biopsy  Sep 2012  . Hernia repair      belly button  . Esophagogastroduodenoscopy  07/16/2011    Procedure: ESOPHAGOGASTRODUODENOSCOPY (EGD);  Surgeon: Willis ModenaWilliam Outlaw, MD;  [facility];  Service: Endoscopy;  Laterality: N/A;  . Cesarean section     Social History:  reports that she quit smoking about 8 years ago. She has never used smokeless tobacco. She reports that she does not drink alcohol or use illicit drugs.  Allergies  Allergen Reactions  . Latex Rash  . Actos [Pioglitazone] Swelling and Other (See Comments)    edema Edema   . Byetta 10 Mcg Pen [Exenatide] Other (See Comments)    Stomach upset  . Crestor [Rosuvastatin] Other (See Comments)    Other reaction(s): Liver Disorder Elevated LFT's Increases LFT  . Lipitor [Atorvastatin] Other (See Comments)    Other reaction(s): Muscle Pain Muscle aches  . Other Other (See Comments)    Stomach upset  . Penicillin G Hives  . Penicillins Hives and Itching    All over the body.    Family History  Problem Relation Age of Onset  . Cancer Mother   . Hypertension Mother   . Hyperlipidemia Father   . Iron deficiency Sister   . Heart disease Brother   . Hyperlipidemia Brother      Prior to Admission medications   Medication Sig Start Date End Date Taking? Authorizing Provider  aspirin EC 81 MG tablet Take 81 mg by mouth every morning.   Yes Historical Provider, MD  cholecalciferol (VITAMIN D) 400 UNITS TABS tablet Take 400 Units by mouth every morning.   Yes Historical Provider, MD  colesevelam (WELCHOL) 625 MG tablet Take 1,875 mg by mouth 2 (two) times daily. Three pills taken per dosage. Total of six per day.   Yes Historical Provider, MD  diphenhydrAMINE (BENADRYL) 25 MG tablet Take 25 mg by mouth every 6 (six) hours as needed for itching (itching).   Yes Historical Provider, MD   DULoxetine (CYMBALTA) 60 MG capsule Take 60 mg by mouth every morning.    Yes Historical Provider, MD  esomeprazole (NEXIUM) 40 MG capsule Take 40 mg by mouth daily before breakfast.    Yes Historical Provider, MD  ezetimibe (ZETIA) 10 MG tablet Take 10 mg by mouth at bedtime.    Yes Historical Provider, MD  ferrous sulfate 325 (65 FE) MG tablet Take 325 mg by mouth 2 (two) times daily.   Yes Historical Provider, MD  furosemide (LASIX) 80 MG tablet Take 1 tablet (80 mg total) by mouth daily. 12/31/13  Yes Penny Pia, MD  gabapentin (NEURONTIN) 300 MG capsule Take 300 mg by mouth at bedtime.    Yes Historical Provider, MD  insulin detemir (LEVEMIR) 100 UNIT/ML injection Inject 0.2 mLs (20 Units total) into the skin every morning. 12/31/13  Yes Penny Pia, MD  insulin lispro (HUMALOG) 100 UNIT/ML injection Inject 0.03 mLs (3 Units total) into the skin daily with supper. Patient taking differently: Inject 3 Units into the skin daily with supper. Only give it 50% of supper is eaten 12/31/13  Yes Penny Pia, MD  lactulose (CHRONULAC) 10 GM/15ML solution Take 20 g by mouth 3 (three) times daily as needed for mild constipation (constipation).    Yes Historical Provider, MD  meloxicam (MOBIC) 7.5 MG tablet Take 7.5 mg by mouth every morning.  02/19/13  Yes Historical Provider, MD  omeprazole (PRILOSEC) 40 MG capsule Take 40 mg by mouth daily.   Yes Historical Provider, MD  propranolol (INDERAL) 10 MG tablet Take 10 mg by mouth 2 (two) times daily.  02/20/13  Yes Historical Provider, MD  rifaximin (XIFAXAN) 550 MG TABS tablet Take 550 mg by mouth 2 (two) times daily.   Yes Historical Provider, MD  spironolactone (ALDACTONE) 100 MG tablet Take 1 tablet (100 mg total) by mouth 2 (two) times daily. 12/31/13  Yes Penny Pia, MD  ALPRAZolam Prudy Feeler) 0.5 MG tablet Take 0.5 mg by mouth at bedtime as needed for anxiety.    Historical Provider, MD   Physical Exam: Filed Vitals:   2014/02/23 1409 02-23-14 1410  02/23/2014 1422 02/23/14 1533  BP: 125/56  125/56 112/70  Pulse:  82 82 81  Temp:   95.5 F (35.3 C)   TempSrc:   Rectal   Resp:   22   Height:      Weight:      SpO2:  98% 98% 96%    Wt Readings from Last 3 Encounters:  02/23/14 83.008 kg (183 lb)  12/31/13 81.7 kg (180 lb 1.9 oz)  12/21/13 90.719 kg (  200 lb)    General: Patient very agitated, trying to stand up from bed, yelling let me go.  Eyes: PERRL, icteric conjunctiva.  ENT: lips & tongue dry, dry old blood mouth and nares. No visual active bleeding.  Neck: limited exam.  Cardiovascular: RRR, no m/r/g. No LE edema. Telemetry: SR, no arrhythmias  Respiratory:  Normal respiratory effort. Decrease breath sound.  Abdomen: soft, mild tenderness, limited exam due to patient agitation.  Skin: no rash or induration seen on limited exam, ecchymosis.  Musculoskeletal: unable to assess.  Neurologic: Patient very agitated, screaming, wanted to get out of the bed. Move lower extremities.           Labs on Admission:  Basic Metabolic Panel:  Recent Labs Lab 01/15/2014 1154  NA 133*  K 4.5  CL 99  CO2 20  GLUCOSE 238*  BUN 18  CREATININE 0.85  CALCIUM 9.6   Liver Function Tests:  Recent Labs Lab 01/23/2014 1154  AST 61*  ALT 49*  ALKPHOS 100  BILITOT 7.3*  PROT 8.1  ALBUMIN 2.3*    Recent Labs Lab 02/08/2014 1154  LIPASE 60*    Recent Labs Lab 02/11/2014 1258  AMMONIA 67*   CBC:  Recent Labs Lab 02/11/2014 1154  WBC 7.4  HGB 15.2*  HCT 42.3  MCV 98.8  PLT 123*   Cardiac Enzymes: No results for input(s): CKTOTAL, CKMB, CKMBINDEX, TROPONINI in the last 168 hours.  BNP (last 3 results) No results for input(s): PROBNP in the last 8760 hours. CBG:  Recent Labs Lab 02/11/2014 1136  GLUCAP 199*    Radiological Exams on Admission: Dg Chest 2 View  02/13/2014   CLINICAL DATA:  Altered mental status. Marked abdominal pain. Abdominal distention.  EXAM: CHEST  2 VIEW  COMPARISON:  01/02/2014.  FINDINGS:  Large left pleural effusion. The visualized underlying lung appears normally pneumatized. The right lung is clear with stable prominence of the interstitial markings. The heart remains grossly normal in size. Thoracic spine degenerative changes.  IMPRESSION: 1. Large left pleural effusion. 2. Stable chronic interstitial lung disease.   Electronically Signed   By: Gordan PaymentSteve  Reid M.D.   On: 02/10/2014 14:03   Ct Head Wo Contrast  02/08/2014   CLINICAL DATA:  Altered mental status for the past 36 hr. Epistaxis.  EXAM: CT HEAD WITHOUT CONTRAST  TECHNIQUE: Contiguous axial images were obtained from the base of the skull through the vertex without intravenous contrast.  COMPARISON:  06/06/2007.  FINDINGS: There are motion artifacts on 2 sets of images. Normal appearing cerebral hemispheres and posterior fossa structures. Normal size and position of the ventricles. No intracranial hemorrhage, mass lesion or CT evidence of acute infarction. Unremarkable bones and included paranasal sinuses. Mild left cavernous internal carotid artery atheromatous calcifications.  IMPRESSION: No acute abnormality.   Electronically Signed   By: Gordan PaymentSteve  Reid M.D.   On: 02/11/2014 13:53    EKG: Independently reviewed.   Assessment/Plan Active Problems:   NASH (nonalcoholic steatohepatitis)   Ascites   Abdominal distension   Diabetes   Pleural effusion associated with hepatic disorder   Altered mental status   Encephalopathy acute   Acute encephalopathy  1-Acute Encephalopathy;  In setting of infection, decompensated liver failure. Ammonia level mildly elevated at 67.  She received small dose of ativan.. After that she is more calm. Will need to try to remove restraint as soon as possible per family request. Continue with lactulose. Will treat for infectious process with antibiotics.  CT  head with no acute abnormalities.   2-Hypothermia, Concern for sepsis:  Admit to step down unit. Continue with bear hugger.  Continue  with IV vancomycin, Aztreonam.  Pan culture. UA with positive nitrates.  Lactic acid at 3.5.   3-GI bleed; hematochezia:  Follow CBC.  IV Protonix. GI consulted.  Will give one dose of IV vitamin K 5 mg.   4-Cirrhosis of liver, NASH; decompensated.  Continue with lactulose, resume rifaximin in am.  GI consulted. Will order abdominal US.  -For Ascites, and abdominal pain; Will order paracentesis diagnosis and therapeutics. IV antibiotics to cover for SBP. I will change Levaquin to ciprofloxacin.  -Hold spironolactone and lasix  for now, monitor BP.   5-Diabetes; SSI for now, start long acting insulin when tolerating diet.   6-Dyslipidemia; Will hold Wellchol and zetia.   7-depression; on Cymbalta.   8-Left pleural effussion; recurrent. She will likely required thoracentesis during this  admission. Oxygen saturation 98 %.  Code Status: Full Code.  DVT Prophylaxis:no anticoagulation, high risk for bleeding.  Family Communication: Care discussed with Husband.  Disposition Plan: expect 3 to 4 days inpatient.   Time spent: 75 minutes.   Hartley Barefoot A [facility] Pager [phone]

## 2014-02-12 NOTE — Progress Notes (Signed)
Pt is very agitated, rolling back in forth in bed, calling out Husband's name "Jillyn HiddenGary".   Paged [facility], Order for Haldol increased from 0.5mg  to 1.0 mg, order for Lactulose changed from oral to enema, Flexiseal ordered and foley.

## 2014-02-12 NOTE — ED Notes (Signed)
Dr. Fredderick PhenixBelfi aware of positive hemoccult card.

## 2014-02-12 NOTE — Consult Note (Signed)
[facility] Consult Note  Referring Provider: No ref. provider found Primary Care Physician:  Michelle Pac, MD Primary Gastroenterologist:  Dr.  Laurel Dimmer Complaint: Agitation and combativeness HPI: [name] is an 58 y.o. white female  who presents with worsening obtundation mental status changes and agitation and restlessness. She has Karlene Lineman cirrhosis and is on transplant list at [facility] with a baseline in the LD score of approximately 20. She was last in the hospital in October and had a large left pleural effusion and ascites. She was started on 100 mg of spironolactone and 40 mg of Lasix. She also takes his rifaximin and lactulose. Her main problems have been agitated mental status changes for the last 3 days. She also has frequent epistaxis and had some blood in her bowel movements today but generally has not had grossly bloody bowel movements. She had an EGD which showed grade 1 varices in 2013. It is not known whether she has ever had spontaneous bacterial peritonitis in the past.  Past Medical History  Diagnosis Date  . Hypertension   . Diabetes mellitus   . Hyperlipidemia   . Fatigue   . Night sweats   . Wears dentures   . Poor appetite   . Retinopathy   . Arthritis   . Rash   . Bruises easily   . Colon polyp   . Hernia   . Rectal bleeding   . Hemorrhoid   . Family history of breast cancer   . Abdominal pain     spasms radiating around to the right side of abdomen   . Shortness of breath   . Night sweats   . Hepatic encephalopathy   . Non-alcoholic fatty liver disease   . Liver cirrhosis     nonalcoholic  . GERD (gastroesophageal reflux disease)   . Depression   . Anxiety   . COPD (chronic obstructive pulmonary disease)     pt denies this  . Anemia   . IBS (irritable bowel syndrome)   . RLS (restless legs syndrome)   . Macular degeneration   . Memory loss     hepatatic encephlopathy  . Degenerative arthritis   . Diabetes 1.5, managed  as type 1   . Neuropathy, peripheral     feet legs    Past Surgical History  Procedure Laterality Date  . Cholecystectomy  11/28/10  . Liver biopsy  Sep 2012  . Hernia repair      belly button  . Esophagogastroduodenoscopy  07/16/2011    Procedure: ESOPHAGOGASTRODUODENOSCOPY (EGD);  Surgeon: Arta Silence, MD;  Location: [facility];  Service: Endoscopy;  Laterality: N/A;  . Cesarean section      Medications Prior to Admission  Medication Sig Dispense Refill  . aspirin EC 81 MG tablet Take 81 mg by mouth every morning.    . cholecalciferol (VITAMIN D) 400 UNITS TABS tablet Take 400 Units by mouth every morning.    . colesevelam (WELCHOL) 625 MG tablet Take 1,875 mg by mouth 2 (two) times daily. Three pills taken per dosage. Total of six per day.    . diphenhydrAMINE (BENADRYL) 25 MG tablet Take 25 mg by mouth every 6 (six) hours as needed for itching (itching).    . DULoxetine (CYMBALTA) 60 MG capsule Take 60 mg by mouth every morning.     [name] Kitchen esomeprazole (NEXIUM) 40 MG capsule Take 40 mg by mouth daily before breakfast.     . ezetimibe (ZETIA) 10 MG tablet Take 10 mg by  mouth at bedtime.     . ferrous sulfate 325 (65 FE) MG tablet Take 325 mg by mouth 2 (two) times daily.    . furosemide (LASIX) 80 MG tablet Take 1 tablet (80 mg total) by mouth daily. 40 tablet 0  . gabapentin (NEURONTIN) 300 MG capsule Take 300 mg by mouth at bedtime.     . insulin detemir (LEVEMIR) 100 UNIT/ML injection Inject 0.2 mLs (20 Units total) into the skin every morning. 10 mL 11  . insulin lispro (HUMALOG) 100 UNIT/ML injection Inject 0.03 mLs (3 Units total) into the skin daily with supper. (Patient taking differently: Inject 3 Units into the skin daily with supper. Only give it 50% of supper is eaten) 10 mL 0  . lactulose (CHRONULAC) 10 GM/15ML solution Take 20 g by mouth 3 (three) times daily as needed for mild constipation (constipation).     . meloxicam (MOBIC) 7.5 MG tablet Take 7.5 mg by mouth every  morning.     [name] Kitchen omeprazole (PRILOSEC) 40 MG capsule Take 40 mg by mouth daily.    . propranolol (INDERAL) 10 MG tablet Take 10 mg by mouth 2 (two) times daily.     . rifaximin (XIFAXAN) 550 MG TABS tablet Take 550 mg by mouth 2 (two) times daily.    [name] Kitchen spironolactone (ALDACTONE) 100 MG tablet Take 1 tablet (100 mg total) by mouth 2 (two) times daily. 60 tablet 0  . ALPRAZolam (XANAX) 0.5 MG tablet Take 0.5 mg by mouth at bedtime as needed for anxiety.      Allergies:  Allergies  Allergen Reactions  . Latex Rash  . Actos [Pioglitazone] Swelling and Other (See Comments)    edema Edema   . Byetta 10 Mcg Pen [Exenatide] Other (See Comments)    Stomach upset  . Crestor [Rosuvastatin] Other (See Comments)    Other reaction(s): Liver Disorder Elevated LFT's Increases LFT  . Lipitor [Atorvastatin] Other (See Comments)    Other reaction(s): Muscle Pain Muscle aches  . Other Other (See Comments)    Stomach upset  . Penicillin G Hives  . Penicillins Hives and Itching    All over the body.    Family History  Problem Relation Age of Onset  . Cancer Mother   . Hypertension Mother   . Hyperlipidemia Father   . Iron deficiency Sister   . Heart disease Brother   . Hyperlipidemia Brother     Social History:  reports that she quit smoking about 8 years ago. She has never used smokeless tobacco. She reports that she does not drink alcohol or use illicit drugs.  Review of Systems: negative except as above  Blood pressure 112/70, pulse 81, temperature 95.5 F (35.3 C), temperature source Rectal, resp. rate 22, height _0  (1.651 m), weight 83.008 kg (183 lb), SpO2 96 %.  Combative and agitated not able to verbalize what is bothering her  Head: Normocephalic, without obvious abnormality, atraumaticEyes icteric  Neck: no adenopathy, no carotid bruit, no JVD, supple, symmetrical, trachea midline and thyroid not enlarged, symmetric, no tenderness/mass/nodules Resp: clear to auscultation  bilaterally Cardio: regular rate and rhythm, S1, S2 normal, no murmur, click, rub or gallop GI: Abdomen moderately distended with normoactive bowel sounds no obvious rebound no obvious tenderness or organomegaly  Extremities: extremities normal, atraumatic, no cyanosis or edema  Results for orders placed or performed during the hospital encounter of 01/17/2014 (from the past 48 hour(s))  CBG monitoring, ED     Status: Abnormal  Collection Time: 01/16/2014 11:36 AM  Result Value Ref Range   Glucose-Capillary 199 (H) 70 - 99 mg/dL  CBC     Status: Abnormal   Collection Time: 01/17/2014 11:54 AM  Result Value Ref Range   WBC 7.4 4.0 - 10.5 K/uL   RBC 4.28 3.87 - 5.11 MIL/uL   Hemoglobin 15.2 (H) 12.0 - 15.0 g/dL   HCT 42.3 36.0 - 46.0 %   MCV 98.8 78.0 - 100.0 fL   MCH 35.5 (H) 26.0 - 34.0 pg   MCHC 35.9 30.0 - 36.0 g/dL   RDW 16.4 (H) 11.5 - 15.5 %   Platelets 123 (L) 150 - 400 K/uL  Comprehensive metabolic panel     Status: Abnormal   Collection Time: 01/25/2014 11:54 AM  Result Value Ref Range   Sodium 133 (L) 137 - 147 mEq/L   Potassium 4.5 3.7 - 5.3 mEq/L   Chloride 99 96 - 112 mEq/L   CO2 20 19 - 32 mEq/L   Glucose, Bld 238 (H) 70 - 99 mg/dL   BUN 18 6 - 23 mg/dL   Creatinine, Ser 0.85 0.50 - 1.10 mg/dL   Calcium 9.6 8.4 - 10.5 mg/dL   Total Protein 8.1 6.0 - 8.3 g/dL   Albumin 2.3 (L) 3.5 - 5.2 g/dL   AST 61 (H) 0 - 37 U/L   ALT 49 (H) 0 - 35 U/L   Alkaline Phosphatase 100 39 - 117 U/L   Total Bilirubin 7.3 (H) 0.3 - 1.2 mg/dL   GFR calc non Af Amer 73 (L) >90 mL/min   GFR calc Af Amer 85 (L) >90 mL/min    Comment: (NOTE) The eGFR has been calculated using the CKD EPI equation. This calculation has not been validated in all clinical situations. eGFR's persistently <90 mL/min signify possible Chronic Kidney Disease.    Anion gap 14 5 - 15  Lipase, blood     Status: Abnormal   Collection Time: 01/20/2014 11:54 AM  Result Value Ref Range   Lipase 60 (H) 11 - 59 U/L  I-stat  troponin, ED (only if pt is 60 y.o. or older & pain is above umbilicus) - do not order at [facility]     Status: None   Collection Time: 01/26/2014 12:15 PM  Result Value Ref Range   Troponin i, poc 0.00 0.00 - 0.08 ng/mL   Comment 3            Comment: Due to the release kinetics of cTnI, a negative result within the first hours of the onset of symptoms does not rule out myocardial infarction with certainty. If myocardial infarction is still suspected, repeat the test at appropriate intervals.   Ammonia     Status: Abnormal   Collection Time: 01/26/2014 12:58 PM  Result Value Ref Range   Ammonia 67 (H) 11 - 60 umol/L  Protime-INR     Status: Abnormal   Collection Time: 01/21/2014 12:58 PM  Result Value Ref Range   Prothrombin Time 21.8 (H) 11.6 - 15.2 seconds   INR 1.88 (H) 0.00 - 1.49  APTT     Status: Abnormal   Collection Time: 02/13/2014 12:58 PM  Result Value Ref Range   aPTT 49 (H) 24 - 37 seconds    Comment:        IF BASELINE aPTT IS ELEVATED, SUGGEST PATIENT RISK ASSESSMENT BE USED TO DETERMINE APPROPRIATE ANTICOAGULANT THERAPY.   Lactic acid, plasma     Status: Abnormal   Collection Time:  02/01/2014 12:59 PM  Result Value Ref Range   Lactic Acid, Venous 3.5 (H) 0.5 - 2.2 mmol/L  POC occult blood, ED     Status: Abnormal   Collection Time: 01/17/2014  2:30 PM  Result Value Ref Range   Fecal Occult Bld POSITIVE (A) NEGATIVE  Urinalysis, Routine w reflex microscopic     Status: Abnormal   Collection Time: 02/01/2014  2:39 PM  Result Value Ref Range   Color, Urine AMBER (A) YELLOW    Comment: BIOCHEMICALS MAY BE AFFECTED BY COLOR   APPearance CLOUDY (A) CLEAR   Specific Gravity, Urine 1.036 (H) 1.005 - 1.030   pH 5.0 5.0 - 8.0   Glucose, UA NEGATIVE NEGATIVE mg/dL   Hgb urine dipstick NEGATIVE NEGATIVE   Bilirubin Urine NEGATIVE NEGATIVE   Ketones, ur 15 (A) NEGATIVE mg/dL   Protein, ur NEGATIVE NEGATIVE mg/dL   Urobilinogen, UA 1.0 0.0 - 1.0 mg/dL   Nitrite POSITIVE (A)  NEGATIVE   Leukocytes, UA SMALL (A) NEGATIVE  Urine microscopic-add on     Status: Abnormal   Collection Time: 02/11/2014  2:39 PM  Result Value Ref Range   Squamous Epithelial / LPF RARE RARE   WBC, UA 0-2 <3 WBC/hpf   RBC / HPF 0-2 <3 RBC/hpf   Casts HYALINE CASTS (A) NEGATIVE   Crystals CA OXALATE CRYSTALS (A) NEGATIVE   Urine-Other MUCOUS PRESENT     Comment: AMORPHOUS URATES/PHOSPHATES   Dg Chest 2 View  01/27/2014   CLINICAL DATA:  Altered mental status. Marked abdominal pain. Abdominal distention.  EXAM: CHEST  2 VIEW  COMPARISON:  01/02/2014.  FINDINGS: Large left pleural effusion. The visualized underlying lung appears normally pneumatized. The right lung is clear with stable prominence of the interstitial markings. The heart remains grossly normal in size. Thoracic spine degenerative changes.  IMPRESSION: 1. Large left pleural effusion. 2. Stable chronic interstitial lung disease.   Electronically Signed   By: Enrique Sack M.D.   On: 01/16/2014 14:03   Ct Head Wo Contrast  01/18/2014   CLINICAL DATA:  Altered mental status for the past 36 hr. Epistaxis.  EXAM: CT HEAD WITHOUT CONTRAST  TECHNIQUE: Contiguous axial images were obtained from the base of the skull through the vertex without intravenous contrast.  COMPARISON:  06/06/2007.  FINDINGS: There are motion artifacts on 2 sets of images. Normal appearing cerebral hemispheres and posterior fossa structures. Normal size and position of the ventricles. No intracranial hemorrhage, mass lesion or CT evidence of acute infarction. Unremarkable bones and included paranasal sinuses. Mild left cavernous internal carotid artery atheromatous calcifications.  IMPRESSION: No acute abnormality.   Electronically Signed   By: Enrique Sack M.D.   On: 01/24/2014 13:53    Assessment: 1. Nonalcoholic cirrhosis end-stage meld score currently 21 2. Hepatic encephalopathy rule out other causes of delirium and agitation , head CT negative 3. Ascites rule  out SBP  4. Left pleural effusion recurrent  Plan:  1. Broad-spectrum antibiotics  2. Watch her for signs of worsening GI bleeding and consider EGD to rule out bleeding esophageal varices  3. Paracentesis to rule out SBP while treating empirically with antibiotics. 4. Consider abdominal imaging especially if ascites fluid infected.  5. Continue lactulose and rifaximin  6. Consider repeat thoracentesis if needed  7.  Camary Sosa C 02/09/2014, 4:52 PM

## 2014-02-12 NOTE — ED Notes (Signed)
Per pt's husband, Lou MinerGary Imel, pt has been lethargic for the past 36 hours. Pt has been taking Lactulose q4h but remains altered. Pt alert, oriented to self and situation. Pt unable to state time or place. Pt reports 10/10 abdominal pain, gross ascites noted. Pt continually attempting to walk to BR. Pt nosebleed has stopped at this time.

## 2014-02-12 NOTE — ED Provider Notes (Signed)
CSN: [id number]     Arrival date & time 01/18/2014  1122 History   First MD Initiated Contact with Patient 01/27/2014 1239     Chief Complaint  Patient presents with  . Altered Mental Status  . Epistaxis   Level V caveat. Unable to obtain ROS due to altered mental status of patient. History obtained from husband, Lou MinerGary Grassia, and from patient's history.   [name] is a 58 y.o. female with a history of diabetes, cirrhosis, encephalopathy, NASH and COPD who presents to the ED with her husband reports she has been altered for the past 36 hours as well as an intermittent nose bleed. Patient [facility]. Patient was admitted back in October of this year for cirrhosis, pleural effusion and ascites. The patient had a thoracentesis back in 12/19/2013 as well as a paracentesis performed on 12/16/2013. Her husband reports that she's had an intermittent nosebleed today. At this time bleeding is controlled. Husband reports that she's been taking lactulose every 4 hours but is still been altered. The patient is stating that she is having abdominal pain all over her abdomen. The husband denies that she's had fevers, diarrhea, constipation or complaining of shortness of breath. The husband also reports she has had some blood in her stool.   (Consider location/radiation/quality/duration/timing/severity/associated sxs/prior Treatment) HPI  Past Medical History  Diagnosis Date  . Hypertension   . Diabetes mellitus   . Hyperlipidemia   . Fatigue   . Night sweats   . Wears dentures   . Poor appetite   . Retinopathy   . Arthritis   . Rash   . Bruises easily   . Colon polyp   . Hernia   . Rectal bleeding   . Hemorrhoid   . Family history of breast cancer   . Abdominal pain     spasms radiating around to the right side of abdomen   . Shortness of breath   . Night sweats   . Hepatic encephalopathy   . Non-alcoholic fatty liver disease   . Liver cirrhosis      nonalcoholic  . GERD (gastroesophageal reflux disease)   . Depression   . Anxiety   . COPD (chronic obstructive pulmonary disease)     pt denies this  . Anemia   . IBS (irritable bowel syndrome)   . RLS (restless legs syndrome)   . Macular degeneration   . Memory loss     hepatatic encephlopathy  . Degenerative arthritis   . Diabetes 1.5, managed as type 1   . Neuropathy, peripheral     feet legs   Past Surgical History  Procedure Laterality Date  . Cholecystectomy  11/28/10  . Liver biopsy  Sep 2012  . Hernia repair      belly button  . Esophagogastroduodenoscopy  07/16/2011    Procedure: ESOPHAGOGASTRODUODENOSCOPY (EGD);  Surgeon: Willis ModenaWilliam Outlaw, MD;  [facility];  Service: Endoscopy;  Laterality: N/A;  . Cesarean section     Family History  Problem Relation Age of Onset  . Cancer Mother   . Hypertension Mother   . Hyperlipidemia Father   . Iron deficiency Sister   . Heart disease Brother   . Hyperlipidemia Brother    History  Substance Use Topics  . Smoking status: Former Smoker    Quit date: 08/21/2005  . Smokeless tobacco: Never Used  . Alcohol Use: No   OB History    No data available  Review of Systems  Unable to perform ROS     Allergies  Latex; Actos; Byetta 10 mcg pen; Crestor; Lipitor; Other; Penicillin g; and Penicillins  Home Medications   Prior to Admission medications   Medication Sig Start Date End Date Taking? Authorizing Provider  aspirin EC 81 MG tablet Take 81 mg by mouth every morning.   Yes Historical Provider, MD  cholecalciferol (VITAMIN D) 400 UNITS TABS tablet Take 400 Units by mouth every morning.   Yes Historical Provider, MD  colesevelam (WELCHOL) 625 MG tablet Take 1,875 mg by mouth 2 (two) times daily. Three pills taken per dosage. Total of six per day.   Yes Historical Provider, MD  diphenhydrAMINE (BENADRYL) 25 MG tablet Take 25 mg by mouth every 6 (six) hours as needed for itching (itching).   Yes Historical  Provider, MD  DULoxetine (CYMBALTA) 60 MG capsule Take 60 mg by mouth every morning.    Yes Historical Provider, MD  esomeprazole (NEXIUM) 40 MG capsule Take 40 mg by mouth daily before breakfast.    Yes Historical Provider, MD  ezetimibe (ZETIA) 10 MG tablet Take 10 mg by mouth at bedtime.    Yes Historical Provider, MD  ferrous sulfate 325 (65 FE) MG tablet Take 325 mg by mouth 2 (two) times daily.   Yes Historical Provider, MD  furosemide (LASIX) 80 MG tablet Take 1 tablet (80 mg total) by mouth daily. 12/31/13  Yes Penny Pia, MD  gabapentin (NEURONTIN) 300 MG capsule Take 300 mg by mouth at bedtime.    Yes Historical Provider, MD  insulin detemir (LEVEMIR) 100 UNIT/ML injection Inject 0.2 mLs (20 Units total) into the skin every morning. 12/31/13  Yes Penny Pia, MD  insulin lispro (HUMALOG) 100 UNIT/ML injection Inject 0.03 mLs (3 Units total) into the skin daily with supper. Patient taking differently: Inject 3 Units into the skin daily with supper. Only give it 50% of supper is eaten 12/31/13  Yes Penny Pia, MD  lactulose (CHRONULAC) 10 GM/15ML solution Take 20 g by mouth 3 (three) times daily as needed for mild constipation (constipation).    Yes Historical Provider, MD  meloxicam (MOBIC) 7.5 MG tablet Take 7.5 mg by mouth every morning.  02/19/13  Yes Historical Provider, MD  omeprazole (PRILOSEC) 40 MG capsule Take 40 mg by mouth daily.   Yes Historical Provider, MD  propranolol (INDERAL) 10 MG tablet Take 10 mg by mouth 2 (two) times daily.  02/20/13  Yes Historical Provider, MD  rifaximin (XIFAXAN) 550 MG TABS tablet Take 550 mg by mouth 2 (two) times daily.   Yes Historical Provider, MD  spironolactone (ALDACTONE) 100 MG tablet Take 1 tablet (100 mg total) by mouth 2 (two) times daily. 12/31/13  Yes Penny Pia, MD  ALPRAZolam Prudy Feeler) 0.5 MG tablet Take 0.5 mg by mouth at bedtime as needed for anxiety.    Historical Provider, MD   BP 112/70 mmHg  Pulse 81  Temp(Src) 95.5 F  (35.3 C) (Rectal)  Resp 22  Ht 5\' 5"  (1.651 m)  Wt 183 lb (83.008 kg)  BMI 30.45 kg/m2  SpO2 96% Physical Exam  Constitutional: She appears well-developed and well-nourished. No distress.  HENT:  Head: Normocephalic and atraumatic.  Right Ear: External ear normal.  Left Ear: External ear normal.  Dried blood in patient's mouth. Oropharynx is clear. Dried blood around patient's nose. No active nose bleed at this time.   Eyes: Pupils are equal, round, and reactive to light. Right eye exhibits no  discharge. Left eye exhibits no discharge.  Neck: Normal range of motion. No JVD present.  Cardiovascular: Normal rate, regular rhythm, normal heart sounds and intact distal pulses.  Exam reveals no gallop and no friction rub.   No murmur heard. Heart rate 80  Pulmonary/Chest: Effort normal. No respiratory distress. She has no wheezes.  Diminished lung sounds on the left.   Abdominal: Bowel sounds are normal. She exhibits distension. There is tenderness.  Abdomen is soft. Ascites noted. Abdomen is diffusely tender to palpation.   Genitourinary: Guaiac positive stool.  Rectal exam performed by Dr. Fredderick PhenixBelfi. No gross blood noted. The patient had a positive Hemoccult.   Musculoskeletal:  Patient is spontaneously moving all extremities in a coordinated fashion exhibiting good strength.   Lymphadenopathy:    She has no cervical adenopathy.  Neurological: She is alert.  Patient is oriented to person and event only.   Skin: Skin is warm and dry. No rash noted. She is not diaphoretic. No erythema. No pallor.  Psychiatric: She is agitated.  Patient is agitated and confused. She is oriented to person and event only   Nursing note and vitals reviewed.   ED Course  Procedures (including critical care time) Labs Review Labs Reviewed  CBC - Abnormal; Notable for the following:    Hemoglobin 15.2 (*)    MCH 35.5 (*)    RDW 16.4 (*)    Platelets 123 (*)    All other components within normal limits   COMPREHENSIVE METABOLIC PANEL - Abnormal; Notable for the following:    Sodium 133 (*)    Glucose, Bld 238 (*)    Albumin 2.3 (*)    AST 61 (*)    ALT 49 (*)    Total Bilirubin 7.3 (*)    GFR calc non Af Amer 73 (*)    GFR calc Af Amer 85 (*)    All other components within normal limits  URINALYSIS, ROUTINE W REFLEX MICROSCOPIC - Abnormal; Notable for the following:    Color, Urine AMBER (*)    APPearance CLOUDY (*)    Specific Gravity, Urine 1.036 (*)    Ketones, ur 15 (*)    Nitrite POSITIVE (*)    Leukocytes, UA SMALL (*)    All other components within normal limits  LIPASE, BLOOD - Abnormal; Notable for the following:    Lipase 60 (*)    All other components within normal limits  AMMONIA - Abnormal; Notable for the following:    Ammonia 67 (*)    All other components within normal limits  PROTIME-INR - Abnormal; Notable for the following:    Prothrombin Time 21.8 (*)    INR 1.88 (*)    All other components within normal limits  APTT - Abnormal; Notable for the following:    aPTT 49 (*)    All other components within normal limits  LACTIC ACID, PLASMA - Abnormal; Notable for the following:    Lactic Acid, Venous 3.5 (*)    All other components within normal limits  URINE MICROSCOPIC-ADD ON - Abnormal; Notable for the following:    Casts HYALINE CASTS (*)    Crystals CA OXALATE CRYSTALS (*)    All other components within normal limits  CBG MONITORING, ED - Abnormal; Notable for the following:    Glucose-Capillary 199 (*)    All other components within normal limits  POC OCCULT BLOOD, ED - Abnormal; Notable for the following:    Fecal Occult Bld POSITIVE (*)  All other components within normal limits  CULTURE, BLOOD (ROUTINE X 2)  CULTURE, BLOOD (ROUTINE X 2)  OCCULT BLOOD X 1 CARD TO LAB, STOOL  CBG MONITORING, ED  Rosezena Sensor, ED    Imaging Review Dg Chest 2 View  01/15/2014   CLINICAL DATA:  Altered mental status. Marked abdominal pain. Abdominal  distention.  EXAM: CHEST  2 VIEW  COMPARISON:  01/02/2014.  FINDINGS: Large left pleural effusion. The visualized underlying lung appears normally pneumatized. The right lung is clear with stable prominence of the interstitial markings. The heart remains grossly normal in size. Thoracic spine degenerative changes.  IMPRESSION: 1. Large left pleural effusion. 2. Stable chronic interstitial lung disease.   Electronically Signed   By: Gordan Payment M.D.   On: 01/17/2014 14:03   Ct Head Wo Contrast  02/09/2014   CLINICAL DATA:  Altered mental status for the past 36 hr. Epistaxis.  EXAM: CT HEAD WITHOUT CONTRAST  TECHNIQUE: Contiguous axial images were obtained from the base of the skull through the vertex without intravenous contrast.  COMPARISON:  06/06/2007.  FINDINGS: There are motion artifacts on 2 sets of images. Normal appearing cerebral hemispheres and posterior fossa structures. Normal size and position of the ventricles. No intracranial hemorrhage, mass lesion or CT evidence of acute infarction. Unremarkable bones and included paranasal sinuses. Mild left cavernous internal carotid artery atheromatous calcifications.  IMPRESSION: No acute abnormality.   Electronically Signed   By: Gordan Payment M.D.   On: 02/02/2014 13:53     EKG Interpretation None      Filed Vitals:   01/22/2014 1409 01/24/2014 1410 01/20/2014 1422 01/23/2014 1533  BP: 125/56  125/56 112/70  Pulse:  82 82 81  Temp:   95.5 F (35.3 C)   TempSrc:   Rectal   Resp:   22   Height:      Weight:      SpO2:  98% 98% 96%     MDM   Meds given in ED:  Medications  lactulose (CHRONULAC) 10 GM/15ML solution 20 g (not administered)  levofloxacin (LEVAQUIN) IVPB 750 mg (750 mg Intravenous New Bag/Given 01/16/2014 1512)  aztreonam (AZACTAM) 2 g in dextrose 5 % 50 mL IVPB (not administered)  vancomycin (VANCOCIN) IVPB 1000 mg/200 mL premix (not administered)  phytonadione (VITAMIN K) 5 mg in dextrose 5 % 50 mL IVPB (not administered)   pantoprazole (PROTONIX) injection 40 mg (not administered)  levofloxacin (LEVAQUIN) IVPB 750 mg (not administered)  aztreonam (AZACTAM) 2 g in dextrose 5 % 50 mL IVPB (not administered)  vancomycin (VANCOCIN) IVPB 1000 mg/200 mL premix (not administered)  ondansetron (ZOFRAN) injection 4 mg (4 mg Intravenous Given 02/08/2014 1204)  ondansetron (ZOFRAN) injection 4 mg (4 mg Intravenous Given 02/08/2014 1411)  LORazepam (ATIVAN) injection 0.5 mg (0.5 mg Intravenous Given 01/22/2014 1529)    New Prescriptions   No medications on file    Final diagnoses:  Altered mental state  Altered mental status   [name] is a 58 y.o. female with a history of diabetes, cirrhosis, encephalopathy, NASH and COPD who presents to the ED with her husband reports she has been altered for the past 36 hours as well as an intermittent nose bleed. The patient was recently admitted for similar situation with plural effusion, ascites, and altered mental status.   Patient is agitated in the room and will not remain still. Patient is oriented to person only. The husband reports that she is very altered. Patient had elevated  lactic acid of 3.5. Patient had a positive Hemoccult. There is no gross blood on her DRE. Patient elevated ammonia at 67. The patient's INR is elevated 1.88. The patient's PTT is elevated at 49. Patient's troponin is negative. Patient's liver enzymes are slightly elevated. Patient's GFR is 73. Patient's glucose is 199. Patient's chest x-ray indicated a large left pleural effusion. Patient's CT of her head is unremarkable. The patient is agitated in the room and not willing to sit still while IV antibiotics are running. After discussing with Dr. Fredderick Phenix we will try 0.5 Ativan and medical soft restraints. Dr. Fredderick Phenix consulted [facility] for admission. The patient is currently waiting on a bed.   Patient was discussed with and evaluated by Dr. Fredderick Phenix who agrees with assessment and  plan.     Lawana Chambers, PA-C 02-19-2014 1626  Rolan Bucco, MD 02/27/2014 [phone]

## 2014-02-13 ENCOUNTER — Inpatient Hospital Stay (HOSPITAL_COMMUNITY): Payer: Managed Care, Other (non HMO)

## 2014-02-13 DIAGNOSIS — K921 Melena: Secondary | ICD-10-CM

## 2014-02-13 DIAGNOSIS — K7682 Hepatic encephalopathy: Secondary | ICD-10-CM

## 2014-02-13 DIAGNOSIS — K922 Gastrointestinal hemorrhage, unspecified: Secondary | ICD-10-CM

## 2014-02-13 DIAGNOSIS — A419 Sepsis, unspecified organism: Secondary | ICD-10-CM

## 2014-02-13 DIAGNOSIS — R6521 Severe sepsis with septic shock: Secondary | ICD-10-CM

## 2014-02-13 DIAGNOSIS — K7290 Hepatic failure, unspecified without coma: Secondary | ICD-10-CM

## 2014-02-13 DIAGNOSIS — K7581 Nonalcoholic steatohepatitis (NASH): Secondary | ICD-10-CM

## 2014-02-13 LAB — CBC
HEMATOCRIT: 39.6 % (ref 36.0–46.0)
HEMATOCRIT: 38.7 % (ref 36.0–46.0)
HEMATOCRIT: 37.6 % (ref 36.0–46.0)
HEMATOCRIT: 34 % — AB (ref 36.0–46.0)
Hemoglobin: 14.4 g/dL (ref 12.0–15.0)
Hemoglobin: 13.5 g/dL (ref 12.0–15.0)
Hemoglobin: 12.8 g/dL (ref 12.0–15.0)
Hemoglobin: 11.8 g/dL — ABNORMAL LOW (ref 12.0–15.0)
MCH: 35.7 pg — ABNORMAL HIGH (ref 26.0–34.0)
MCH: 35.1 pg — ABNORMAL HIGH (ref 26.0–34.0)
MCH: 35.5 pg — AB (ref 26.0–34.0)
MCH: 35.1 pg — AB (ref 26.0–34.0)
MCHC: 36.4 g/dL — ABNORMAL HIGH (ref 30.0–36.0)
MCHC: 34.9 g/dL (ref 30.0–36.0)
MCHC: 34 g/dL (ref 30.0–36.0)
MCHC: 34.7 g/dL (ref 30.0–36.0)
MCV: 98.3 fL (ref 78.0–100.0)
MCV: 100.5 fL — AB (ref 78.0–100.0)
MCV: 104.2 fL — AB (ref 78.0–100.0)
MCV: 101.2 fL — ABNORMAL HIGH (ref 78.0–100.0)
PLATELETS: 115 10*3/uL — AB (ref 150–400)
Platelets: ADEQUATE 10*3/uL (ref 150–400)
Platelets: 118 10*3/uL — ABNORMAL LOW (ref 150–400)
Platelets: 109 10*3/uL — ABNORMAL LOW (ref 150–400)
RBC: 4.03 MIL/uL (ref 3.87–5.11)
RBC: 3.85 MIL/uL — AB (ref 3.87–5.11)
RBC: 3.61 MIL/uL — ABNORMAL LOW (ref 3.87–5.11)
RBC: 3.36 MIL/uL — ABNORMAL LOW (ref 3.87–5.11)
RDW: 16.2 % — ABNORMAL HIGH (ref 11.5–15.5)
RDW: 17 % — ABNORMAL HIGH (ref 11.5–15.5)
RDW: 16.9 % — AB (ref 11.5–15.5)
RDW: 17.1 % — AB (ref 11.5–15.5)
WBC: 17.3 10*3/uL — ABNORMAL HIGH (ref 4.0–10.5)
WBC: 16.7 10*3/uL — ABNORMAL HIGH (ref 4.0–10.5)
WBC: 15.6 10*3/uL — AB (ref 4.0–10.5)
WBC: 15.9 10*3/uL — ABNORMAL HIGH (ref 4.0–10.5)

## 2014-02-13 LAB — TYPE AND SCREEN
ABO/RH(D): A NEG
ANTIBODY SCREEN: NEGATIVE

## 2014-02-13 LAB — PREPARE PLATELET PHERESIS
Unit division: 0
Unit division: 0

## 2014-02-13 LAB — BLOOD GAS, ARTERIAL
Acid-base deficit: 12.1 mmol/L — ABNORMAL HIGH (ref 0.0–2.0)
Acid-base deficit: 14.7 mmol/L — ABNORMAL HIGH (ref 0.0–2.0)
Bicarbonate: 12.7 mEq/L — ABNORMAL LOW (ref 20.0–24.0)
Bicarbonate: 11.4 mEq/L — ABNORMAL LOW (ref 20.0–24.0)
DRAWN BY: 276051
Drawn by: 276051
FIO2: 0.21 %
FIO2: 0.6 %
LHR: 14 {breaths}/min
MECHVT: 440 mL
O2 Saturation: 88.1 %
O2 Saturation: 97.8 %
PATIENT TEMPERATURE: 98.6
PCO2 ART: 27.8 mmHg — AB (ref 35.0–45.0)
PEEP/CPAP: 10 cmH2O
Patient temperature: 98.6
TCO2: 11.5 mmol/L (ref 0–100)
TCO2: 10.4 mmol/L (ref 0–100)
pCO2 arterial: 27 mmHg — ABNORMAL LOW (ref 35.0–45.0)
pH, Arterial: 7.295 — ABNORMAL LOW (ref 7.350–7.450)
pH, Arterial: 7.236 — ABNORMAL LOW (ref 7.350–7.450)
pO2, Arterial: 63.1 mmHg — ABNORMAL LOW (ref 80.0–100.0)
pO2, Arterial: 137 mmHg — ABNORMAL HIGH (ref 80.0–100.0)

## 2014-02-13 LAB — COMPREHENSIVE METABOLIC PANEL
ALBUMIN: 1.9 g/dL — AB (ref 3.5–5.2)
ALK PHOS: 89 U/L (ref 39–117)
ALT: 41 U/L — AB (ref 0–35)
ALT: 39 U/L — ABNORMAL HIGH (ref 0–35)
ALT: 123 U/L — ABNORMAL HIGH (ref 0–35)
ANION GAP: 18 — AB (ref 5–15)
ANION GAP: 18 — AB (ref 5–15)
AST: 50 U/L — ABNORMAL HIGH (ref 0–37)
AST: 49 U/L — ABNORMAL HIGH (ref 0–37)
AST: 196 U/L — ABNORMAL HIGH (ref 0–37)
Albumin: 1.9 g/dL — ABNORMAL LOW (ref 3.5–5.2)
Albumin: 1.8 g/dL — ABNORMAL LOW (ref 3.5–5.2)
Alkaline Phosphatase: 93 U/L (ref 39–117)
Alkaline Phosphatase: 88 U/L (ref 39–117)
Anion gap: 24 — ABNORMAL HIGH (ref 5–15)
BUN: 26 mg/dL — AB (ref 6–23)
BUN: 27 mg/dL — AB (ref 6–23)
BUN: 31 mg/dL — AB (ref 6–23)
CALCIUM: 9.3 mg/dL (ref 8.4–10.5)
CALCIUM: 9 mg/dL (ref 8.4–10.5)
CALCIUM: 8.6 mg/dL (ref 8.4–10.5)
CHLORIDE: 100 meq/L (ref 96–112)
CO2: 15 mEq/L — ABNORMAL LOW (ref 19–32)
CO2: 15 mEq/L — ABNORMAL LOW (ref 19–32)
CO2: 12 mEq/L — ABNORMAL LOW (ref 19–32)
CREATININE: 1.19 mg/dL — AB (ref 0.50–1.10)
CREATININE: 1.36 mg/dL — AB (ref 0.50–1.10)
CREATININE: 1.91 mg/dL — AB (ref 0.50–1.10)
Chloride: 96 mEq/L (ref 96–112)
Chloride: 96 mEq/L (ref 96–112)
GFR calc Af Amer: 48 mL/min — ABNORMAL LOW (ref >90)
GFR calc non Af Amer: 49 mL/min — ABNORMAL LOW (ref >90)
GFR calc non Af Amer: 41 mL/min — ABNORMAL LOW (ref >90)
GFR calc non Af Amer: 27 mL/min — ABNORMAL LOW (ref >90)
GFR, EST AFRICAN AMERICAN: 56 mL/min — AB (ref >90)
GFR, EST AFRICAN AMERICAN: 32 mL/min — AB (ref >90)
GLUCOSE: 254 mg/dL — AB (ref 70–99)
Glucose, Bld: 170 mg/dL — ABNORMAL HIGH (ref 70–99)
Glucose, Bld: 166 mg/dL — ABNORMAL HIGH (ref 70–99)
POTASSIUM: 6.1 meq/L — AB (ref 3.7–5.3)
Potassium: 5.1 mEq/L (ref 3.7–5.3)
Potassium: 5.5 mEq/L — ABNORMAL HIGH (ref 3.7–5.3)
Sodium: 129 mEq/L — ABNORMAL LOW (ref 137–147)
Sodium: 133 mEq/L — ABNORMAL LOW (ref 137–147)
Sodium: 132 mEq/L — ABNORMAL LOW (ref 137–147)
TOTAL PROTEIN: 6.7 g/dL (ref 6.0–8.3)
TOTAL PROTEIN: 6 g/dL (ref 6.0–8.3)
TOTAL PROTEIN: 5.9 g/dL — AB (ref 6.0–8.3)
Total Bilirubin: 7.7 mg/dL — ABNORMAL HIGH (ref 0.3–1.2)
Total Bilirubin: 6.9 mg/dL — ABNORMAL HIGH (ref 0.3–1.2)
Total Bilirubin: 6.7 mg/dL — ABNORMAL HIGH (ref 0.3–1.2)

## 2014-02-13 LAB — CBC WITH DIFFERENTIAL/PLATELET
BASOS ABS: 0 10*3/uL (ref 0.0–0.1)
Basophils Relative: 0 % (ref 0–1)
EOS ABS: 0 10*3/uL (ref 0.0–0.7)
Eosinophils Relative: 0 % (ref 0–5)
HCT: 37.9 % (ref 36.0–46.0)
Hemoglobin: 13.4 g/dL (ref 12.0–15.0)
LYMPHS ABS: 2.7 10*3/uL (ref 0.7–4.0)
LYMPHS PCT: 18 % (ref 12–46)
MCH: 35.1 pg — ABNORMAL HIGH (ref 26.0–34.0)
MCHC: 35.4 g/dL (ref 30.0–36.0)
MCV: 99.2 fL (ref 78.0–100.0)
Monocytes Absolute: 2.3 10*3/uL — ABNORMAL HIGH (ref 0.1–1.0)
Monocytes Relative: 15 % — ABNORMAL HIGH (ref 3–12)
NEUTROS ABS: 10 10*3/uL — AB (ref 1.7–7.7)
Neutrophils Relative %: 67 % (ref 43–77)
Platelets: 107 10*3/uL — ABNORMAL LOW (ref 150–400)
RBC: 3.82 MIL/uL — ABNORMAL LOW (ref 3.87–5.11)
RDW: 16.3 % — AB (ref 11.5–15.5)
WBC: 15 10*3/uL — ABNORMAL HIGH (ref 4.0–10.5)

## 2014-02-13 LAB — APTT: APTT: 75 s — AB (ref 24–37)

## 2014-02-13 LAB — PROTIME-INR
INR: 3.31 — AB (ref 0.00–1.49)
INR: 3.69 — AB (ref 0.00–1.49)
INR: 3.93 — ABNORMAL HIGH (ref 0.00–1.49)
PROTHROMBIN TIME: 38.7 s — AB (ref 11.6–15.2)
Prothrombin Time: 33.9 seconds — ABNORMAL HIGH (ref 11.6–15.2)
Prothrombin Time: 36.9 seconds — ABNORMAL HIGH (ref 11.6–15.2)

## 2014-02-13 LAB — LACTIC ACID, PLASMA
LACTIC ACID, VENOUS: 10.2 mmol/L — AB (ref 0.5–2.2)
LACTIC ACID, VENOUS: 11.2 mmol/L — AB (ref 0.5–2.2)

## 2014-02-13 LAB — GLUCOSE, CAPILLARY
GLUCOSE-CAPILLARY: 230 mg/dL — AB (ref 70–99)
Glucose-Capillary: 139 mg/dL — ABNORMAL HIGH (ref 70–99)
Glucose-Capillary: 160 mg/dL — ABNORMAL HIGH (ref 70–99)
Glucose-Capillary: 282 mg/dL — ABNORMAL HIGH (ref 70–99)

## 2014-02-13 LAB — ABO/RH: ABO/RH(D): A NEG

## 2014-02-13 MED ORDER — SUCCINYLCHOLINE CHLORIDE 20 MG/ML IJ SOLN
INTRAMUSCULAR | Status: AC
  Filled 2014-02-13: qty 1

## 2014-02-13 MED ORDER — SODIUM CHLORIDE 0.9 % IV SOLN
Freq: Once | INTRAVENOUS | Status: AC
  Administered 2014-02-13: 1000 mL via INTRAVENOUS

## 2014-02-13 MED ORDER — NOREPINEPHRINE BITARTRATE 1 MG/ML IV SOLN
0.0000 ug/min | INTRAVENOUS | Status: DC
  Administered 2014-02-13: 5 ug/min via INTRAVENOUS
  Administered 2014-02-13 (×3): 30 ug/min via INTRAVENOUS
  Administered 2014-02-13: 15 ug/min via INTRAVENOUS
  Administered 2014-02-13 – 2014-02-14 (×2): 35 ug/min via INTRAVENOUS
  Administered 2014-02-14: 40 ug/min via INTRAVENOUS
  Filled 2014-02-13 (×9): qty 4

## 2014-02-13 MED ORDER — OCTREOTIDE LOAD VIA INFUSION
50.0000 ug | Freq: Once | INTRAVENOUS | Status: AC
  Administered 2014-02-13: 50 ug via INTRAVENOUS
  Filled 2014-02-13: qty 25

## 2014-02-13 MED ORDER — SODIUM CHLORIDE 0.9 % IV BOLUS (SEPSIS)
250.0000 mL | Freq: Once | INTRAVENOUS | Status: AC
  Administered 2014-02-13: 250 mL via INTRAVENOUS

## 2014-02-13 MED ORDER — MIDAZOLAM HCL 2 MG/2ML IJ SOLN
INTRAMUSCULAR | Status: AC
Start: 2014-02-13 — End: 2014-02-13
  Administered 2014-02-13: 2 mg
  Filled 2014-02-13: qty 4

## 2014-02-13 MED ORDER — FAMOTIDINE IN NACL 20-0.9 MG/50ML-% IV SOLN
20.0000 mg | Freq: Two times a day (BID) | INTRAVENOUS | Status: DC
  Administered 2014-02-13 – 2014-02-14 (×3): 20 mg via INTRAVENOUS
  Filled 2014-02-13 (×3): qty 50

## 2014-02-13 MED ORDER — SODIUM BICARBONATE 8.4 % IV SOLN
INTRAVENOUS | Status: DC
  Administered 2014-02-13 – 2014-02-14 (×2): via INTRAVENOUS
  Filled 2014-02-13 (×4): qty 150

## 2014-02-13 MED ORDER — SODIUM POLYSTYRENE SULFONATE 15 GM/60ML PO SUSP
30.0000 g | Freq: Once | ORAL | Status: AC
  Administered 2014-02-13: 30 g
  Filled 2014-02-13: qty 120

## 2014-02-13 MED ORDER — LIDOCAINE HCL (CARDIAC) 20 MG/ML IV SOLN
INTRAVENOUS | Status: AC
  Filled 2014-02-13: qty 5

## 2014-02-13 MED ORDER — FENTANYL CITRATE 0.05 MG/ML IJ SOLN
100.0000 ug | INTRAMUSCULAR | Status: DC | PRN
  Filled 2014-02-13 (×2): qty 2

## 2014-02-13 MED ORDER — SODIUM CHLORIDE 0.9 % IV SOLN
50.0000 ug/h | INTRAVENOUS | Status: DC
  Administered 2014-02-13 – 2014-02-14 (×4): 50 ug/h via INTRAVENOUS
  Filled 2014-02-13 (×10): qty 1

## 2014-02-13 MED ORDER — FENTANYL CITRATE 0.05 MG/ML IJ SOLN
INTRAMUSCULAR | Status: AC
  Administered 2014-02-13: 50 ug
  Filled 2014-02-13: qty 2

## 2014-02-13 MED ORDER — HALOPERIDOL LACTATE 5 MG/ML IJ SOLN: 2.0000 mg | INTRAMUSCULAR | Status: DC | PRN

## 2014-02-13 MED ORDER — FENTANYL CITRATE 0.05 MG/ML IJ SOLN
100.0000 ug | INTRAMUSCULAR | Status: DC | PRN
  Administered 2014-02-14 (×2): 100 ug via INTRAVENOUS
  Filled 2014-02-13: qty 2

## 2014-02-13 MED ORDER — CETYLPYRIDINIUM CHLORIDE 0.05 % MT LIQD
7.0000 mL | Freq: Four times a day (QID) | OROMUCOSAL | Status: DC
  Administered 2014-02-13 – 2014-02-14 (×6): 7 mL via OROMUCOSAL

## 2014-02-13 MED ORDER — ETOMIDATE 2 MG/ML IV SOLN
INTRAVENOUS | Status: AC
  Administered 2014-02-13: 20 mg
  Filled 2014-02-13: qty 20

## 2014-02-13 MED ORDER — SODIUM CHLORIDE 0.9 % IV BOLUS (SEPSIS)
500.0000 mL | Freq: Once | INTRAVENOUS | Status: AC
  Administered 2014-02-13: 500 mL via INTRAVENOUS

## 2014-02-13 MED ORDER — LACTULOSE 10 GM/15ML PO SOLN
30.0000 g | Freq: Four times a day (QID) | ORAL | Status: DC
  Administered 2014-02-13 – 2014-02-14 (×5): 30 g via ORAL
  Filled 2014-02-13 (×5): qty 45

## 2014-02-13 MED ORDER — CHLORHEXIDINE GLUCONATE 0.12 % MT SOLN
15.0000 mL | Freq: Two times a day (BID) | OROMUCOSAL | Status: DC
  Administered 2014-02-13 – 2014-02-14 (×3): 15 mL via OROMUCOSAL
  Filled 2014-02-13 (×3): qty 15

## 2014-02-13 MED ORDER — VANCOMYCIN HCL IN DEXTROSE 750-5 MG/150ML-% IV SOLN
750.0000 mg | Freq: Two times a day (BID) | INTRAVENOUS | Status: DC
  Filled 2014-02-13: qty 150

## 2014-02-13 MED ORDER — SODIUM CHLORIDE 0.9 % IV SOLN
Freq: Once | INTRAVENOUS | Status: AC
  Administered 2014-02-13: 12:00:00 via INTRAVENOUS

## 2014-02-13 MED ORDER — ROCURONIUM BROMIDE 50 MG/5ML IV SOLN
INTRAVENOUS | Status: AC
  Filled 2014-02-13: qty 2

## 2014-02-13 MED ORDER — VANCOMYCIN HCL IN DEXTROSE 750-5 MG/150ML-% IV SOLN
750.0000 mg | Freq: Two times a day (BID) | INTRAVENOUS | Status: DC
  Administered 2014-02-13: 750 mg via INTRAVENOUS
  Filled 2014-02-13 (×2): qty 150

## 2014-02-13 NOTE — Care Management Note (Signed)
    Page 1 of 2   02/13/2014     11:53:05 AM CARE MANAGEMENT NOTE 02/13/2014  Patient:  Derenda FennelBURGESS,Dyane S   Account Number:  [id number]  Date Initiated:  02/13/2014  Documentation initiated by:  Kaydie Petsch  Subjective/Objective Assessment:   gi bleed with sepsis and hx of NASH     Action/Plan:   home when stable or transfer for transplant-on waiting list at [facility]   Anticipated DC Date:  02/16/2014   Anticipated DC Plan:  HOME/SELF CARE  In-house referral  NA      DC Planning Services  CM consult      PAC Choice  NA   Choice offered to / List presented to:  NA   DME arranged  NA      DME agency  NA     HH arranged  NA      HH agency  NA   Status of service:  In process, will continue to follow Medicare Important Message given?   (If response is "NO", the following Medicare IM given date fields will be blank) Date Medicare IM given:   Medicare IM given by:   Date Additional Medicare IM given:   Additional Medicare IM given by:    Discharge Disposition:    Per UR Regulation:  Reviewed for med. necessity/level of care/duration of stay  If discussed at Long Length of Stay Meetings, dates discussed:    Comments:  [id number]/Julane Crock Earlene Plateravis, RN, BSN, CCM Chart reviewed. Discharge needs and patient's stay to be reviewed and followed by case manager.

## 2014-02-13 NOTE — Progress Notes (Signed)
Chaplain present with family providing anticipatory grief and emotional support around intubation.    Belva CromeStalnaker, Anuol[place]apo Mefferd Wayne MDiv

## 2014-02-13 NOTE — Progress Notes (Signed)
RT unable to place arterial line.  Patient's BP improved while titrating down on Levophed.  MD notified, will DC order for arterial line at this time.

## 2014-02-13 NOTE — Plan of Care (Signed)
Problem: Phase I Progression Outcomes Goal: Pain controlled with appropriate interventions Outcome: Completed/Met Date Met:  02/13/14 Morphine 1-2 mg q 4 hrs for severe pain, Haldol 70m q4 for agitation.

## 2014-02-13 NOTE — Procedures (Signed)
Central Venous Catheter Insertion Procedure Note [name] [id number] February 22, 1956  Procedure: Insertion of Central Venous Catheter Indications: Assessment of intravascular volume and Drug and/or fluid administration  Procedure Details Consent: Risks of procedure as well as the alternatives and risks of each were explained to the (patient/caregiver).  Consent for procedure obtained. Time Out: Verified patient identification, verified procedure, site/side was marked, verified correct patient position, special equipment/implants available, medications/allergies/relevent history reviewed, required imaging and test results available.  Performed  Maximum sterile technique was used including antiseptics, cap, gloves, gown, hand hygiene, mask and sheet. Skin prep: Chlorhexidine; local anesthetic administered A antimicrobial bonded/coated triple lumen catheter was placed in the right internal jugular vein using the Seldinger technique.  Ultrasound was used to verify the patency of the vein and for real time needle guidance.  Evaluation Blood flow good Complications: No apparent complications Patient did tolerate procedure well. Chest X-ray ordered to verify placement.  CXR: pending.  MCQUAID, DOUGLAS 02/13/2014, 9:41 AM

## 2014-02-13 NOTE — Procedures (Signed)
Intubation Procedure Note Derenda FennelVicky S Mandelbaum [id number] August 27, 1955  Procedure: Intubation Indications: Respiratory insufficiency  Procedure Details Consent: Risks of procedure as well as the alternatives and risks of each were explained to the (patient/caregiver).  Consent for procedure obtained. Time Out: Verified patient identification, verified procedure, site/side was marked, verified correct patient position, special equipment/implants available, medications/allergies/relevent history reviewed, required imaging and test results available.  Performed  Maximum sterile technique was used including antiseptics, cap, gloves, gown, hand hygiene, mask and sheet.  MAC and 3    Evaluation Hemodynamic Status: Transient hypotension treated with fluid; O2 sats: stable throughout Patient's Current Condition: stable Complications: No apparent complications Patient did tolerate procedure well. Chest X-ray ordered to verify placement.  CXR: pending.   Kendall FlackJackson, Marcellene Shivley Royal 02/13/2014

## 2014-02-13 NOTE — Progress Notes (Signed)
Family gave the name and number of [facility] specialist: Dr Sharlene MottsBerg [phone].

## 2014-02-13 NOTE — Progress Notes (Signed)
Paged [facility], BM's are solid dark red liquid. Order for stat CBC to check HgB ordered.

## 2014-02-13 NOTE — Progress Notes (Signed)
[facility] PROGRESS NOTE  Derenda FennelVicky S Weisinger EAV:[id number] DOB: Dec 08, 1955 DOA: 02/11/2014 PCP: [name],[name], MD  Assessment/Plan: 1-Hematochezia, GI bleed:  -Continue with Octreotide, IV Protonix. IV fluids. -2 Units of FFP ordered. Repeat INR this afternoon.  -GI following.  -Hb at 13 this morning. Continue to monitor.  -CCM consulted, high risk for decompensation.   2-Acute Encephalopathy: -In setting of hepatic encephalopathy, infection process.  -Continue with haldol PRN.  -Continue with IV antibiotics, support care.   3-Sepsis, hypotension:  Patient presents with hypothermia, leukocytosis.  Treating for SBP, UTI.  Continue with IV vancomycin, Aztreonam, Ciprofloxacin.  Blood culture and urine culture pending.   4-Metabolic Acidosis, hyperkalemia; Acute renal insufficiency; In setting of bleeding, hypovolemia.  I will start IV bicarb gtt.   5-Cirrhosis liver: NASH, decompensated:  Continue with antibiotics, octreotide. Hold inderal in setting of hypotension.  Continue with lactulose.  Ascites: She will need Paracentesis, therapeutics, and diagnosis when stable.  Bilirubin trending down.   6-Left Pleural effusion: Oxygen saturation stable. Might need thoracentesis is become symptomatics.   7-Dyslipidemia: hold zetia.   8-Depression: hold home medications while NPO.   Code Status: Full Code.  Family Communication: Care discussed with Husband who was at bedside.  Disposition Plan: Remain in the ICU. Patient hight risk for decompensation, CCM consulted.    Consultants:  GI, Dr Madilyn FiremanHayes, Deboraha Sprangeagle.   CCM.   Procedures:  Abdominal US; pending.   Antibiotics: Aztreonam 11-29 Ciprofloxacin 11-29 Vancomycin 11-29   HPI/Subjective: Patient developed hematochezia overnight. Her BP drop, she responded to IV bolus. FFP ordered.  Patient with flexiseal in place. Blood in flexiseal bag.  Patient this morning appears more calm.   Objective: Filed Vitals:   02/13/14 0545  BP: 108/65  Pulse: 103  Temp: 98.1 F (36.7 C)  Resp: 21    Intake/Output Summary (Last 24 hours) at 02/13/14 0751 Last data filed at 02/13/14 0645  Gross per 24 hour  Intake 1646.25 ml  Output   1725 ml  Net -78.75 ml   Filed Weights   02/08/2014 1138 01/31/2014 1700  Weight: 83.008 kg (183 lb) 80.2 kg (176 lb 12.9 oz)    Exam:   General: resting, move extremities at times.   Cardiovascular: S 1, S 2 RRR  Respiratory: Bilateral decrease breath sounds, no wheezing.   Abdomen: BS present, distended, mild tenderness.   Musculoskeletal: Trace edema.   Data Reviewed: Basic Metabolic Panel:  Recent Labs Lab 01/22/2014 1154 02/13/14 0400 02/13/14 0545  NA 133* 129* 133*  K 4.5 5.1 5.5*  CL 99 96 100  CO2 20 15* 15*  GLUCOSE 238* 170* 166*  BUN 18 26* 27*  CREATININE 0.85 1.19* 1.36*  CALCIUM 9.6 9.3 9.0   Liver Function Tests:  Recent Labs Lab 02/03/2014 1154 02/13/14 0400 02/13/14 0545  AST 61* 50* 49*  ALT 49* 41* 39*  ALKPHOS 100 93 88  BILITOT 7.3* 7.7* 6.9*  PROT 8.1 6.7 6.0  ALBUMIN 2.3* 1.9* 1.8*    Recent Labs Lab 02/13/2014 1154  LIPASE 60*    Recent Labs Lab 01/17/2014 1258  AMMONIA 67*   CBC:  Recent Labs Lab 01/21/2014 1154 02/13/14 0206 02/13/14 0545  WBC 7.4 17.3* 15.0*  NEUTROABS  --   --  10.0*  HGB 15.2* 14.4 13.4  HCT 42.3 39.6 37.9  MCV 98.8 98.3 99.2  PLT 123* PLATELET CLUMPS NOTED ON SMEAR, COUNT APPEARS ADEQUATE 107*   Cardiac Enzymes: No results for input(s): CKTOTAL, CKMB, CKMBINDEX, TROPONINI in  the last 168 hours. BNP (last 3 results) No results for input(s): PROBNP in the last 8760 hours. CBG:  Recent Labs Lab 01/23/2014 1136 01/27/2014 1751  GLUCAP 199* 198*    Recent Results (from the past 240 hour(s))  MRSA PCR Screening     Status: None   Collection Time: 01/23/2014  4:32 PM  Result Value Ref Range Status   MRSA by PCR NEGATIVE NEGATIVE Final    Comment:        The GeneXpert MRSA Assay  (FDA approved for NASAL specimens only), is one component of a comprehensive MRSA colonization surveillance program. It is not intended to diagnose MRSA infection nor to guide or monitor treatment for MRSA infections.      Studies: Dg Chest 2 View  02/01/2014   CLINICAL DATA:  Altered mental status. Marked abdominal pain. Abdominal distention.  EXAM: CHEST  2 VIEW  COMPARISON:  01/02/2014.  FINDINGS: Large left pleural effusion. The visualized underlying lung appears normally pneumatized. The right lung is clear with stable prominence of the interstitial markings. The heart remains grossly normal in size. Thoracic spine degenerative changes.  IMPRESSION: 1. Large left pleural effusion. 2. Stable chronic interstitial lung disease.   Electronically Signed   By: Gordan PaymentSteve  Reid M.D.   On: 01/23/2014 14:03   Ct Head Wo Contrast  01/20/2014   CLINICAL DATA:  Altered mental status for the past 36 hr. Epistaxis.  EXAM: CT HEAD WITHOUT CONTRAST  TECHNIQUE: Contiguous axial images were obtained from the base of the skull through the vertex without intravenous contrast.  COMPARISON:  06/06/2007.  FINDINGS: There are motion artifacts on 2 sets of images. Normal appearing cerebral hemispheres and posterior fossa structures. Normal size and position of the ventricles. No intracranial hemorrhage, mass lesion or CT evidence of acute infarction. Unremarkable bones and included paranasal sinuses. Mild left cavernous internal carotid artery atheromatous calcifications.  IMPRESSION: No acute abnormality.   Electronically Signed   By: Gordan PaymentSteve  Reid M.D.   On: 01/30/2014 13:53    Scheduled Meds: . aztreonam  2 g Intravenous Q8H  . ciprofloxacin  400 mg Intravenous Q12H  . lactulose  20 g Oral Once  . lactulose  20 g Oral TID  . pantoprazole (PROTONIX) IV  40 mg Intravenous Q12H  . phytonadione (VITAMIN K) IV  5 mg Intravenous Once  . propranolol  5 mg Oral BID  . rifaximin  550 mg Oral BID  . sodium chloride  3  mL Intravenous Q12H  . vancomycin  1,000 mg Intravenous Q12H   Continuous Infusions: . octreotide  (SANDOSTATIN)    IV infusion 50 mcg/hr (02/13/14 0409)  .  sodium bicarbonate  infusion 1000 mL      Active Problems:   NASH (nonalcoholic steatohepatitis)   Ascites   Abdominal distension   Diabetes   Pleural effusion associated with hepatic disorder   Altered mental status   Encephalopathy acute   Acute encephalopathy    Time spent: 35 minutes.     Hartley Barefootegalado, Jerusalem Brownstein A  [facility] Pager [phone]. If 7PM-7AM, please contact night-coverage at [email or web address], password [email or web address] 02/13/2014, 7:51 AM  LOS: 1 day

## 2014-02-13 NOTE — Progress Notes (Signed)
eLink Physician-Brief Progress Note Patient Name: Michelle FennelVicky Galvan Galvan DOB: [birth year] MRN: [id number]   Date of Service  02/13/2014  HPI/Events of Note  I called and discussed case with Dr Gaynell FaceMarshall[facility]. Explained status, desire of Transplant team and [name] family to transfer when feasible. There are no available beds at this time, but the pt was placed on the list for transfer. We will be contacted when they have a bed.   eICU Interventions       Intervention Category Intermediate Interventions: Communication with other healthcare providers and/or family  [name]. 02/13/2014, 7:44 PM

## 2014-02-13 NOTE — H&P (Signed)
PULMONARY / CRITICAL CARE MEDICINE   Name: [name] MRN: [id number] DOB: 02/03/1956    ADMISSION DATE:  01/20/2014 CONSULTATION DATE:  02/13/2014  REFERRING MD :  Sunnie Nielsen  CHIEF COMPLAINT:  Confusion, shock  INITIAL PRESENTATION: 58 y/o female with advanced NASH cirrhosis was admitted on 11/29 with confusion.  She developed shock and a GI bleed and PCCM was consulted on 11/30 for further management.  STUDIES:  11/29 CT head > no acute abnormality  SIGNIFICANT EVENTS:   HISTORY OF PRESENT ILLNESS:  This is a 58 y/o female with NASH cirrhosis who is listed for liver transplantation at [facility] who was admitted on 11/29 after her family noted that her mental status had changed on 11/25.  They noted that she had been OK up until this point, taking her daily lactulose and had not been complaining of fever or nausea.  She does not take narcotic pain medications or benzodiazepines.  Her family notes that she had not been sleeping much in the last few days and she had not been able to carry on a lengthy conversation.  This condition deteriorated and she was brought to the ED by family on 11/29.  Since coming to the hospital she has developed rectal bleeding, worsening encephalopathy and shock.  PAST MEDICAL HISTORY :   has a past medical history of Hypertension; Diabetes mellitus; Hyperlipidemia; Fatigue; Night sweats; Wears dentures; Poor appetite; Retinopathy; Arthritis; Rash; Bruises easily; Colon polyp; Hernia; Rectal bleeding; Hemorrhoid; Family history of breast cancer; Abdominal pain; Shortness of breath; Night sweats; Hepatic encephalopathy; Non-alcoholic fatty liver disease; Liver cirrhosis; GERD (gastroesophageal reflux disease); Depression; Anxiety; COPD (chronic obstructive pulmonary disease); Anemia; IBS (irritable bowel syndrome); RLS (restless legs syndrome); Macular degeneration; Memory loss; Degenerative arthritis; Diabetes 1.5, managed as type 1; and Neuropathy, peripheral.  has  past surgical history that includes Cholecystectomy (11/28/10); Liver biopsy (Sep 2012); Hernia repair; Esophagogastroduodenoscopy (07/16/2011); and Cesarean section. Prior to Admission medications   Medication Sig Start Date End Date Taking? Authorizing Provider  aspirin EC 81 MG tablet Take 81 mg by mouth every morning.   Yes Historical Provider, MD  cholecalciferol (VITAMIN D) 400 UNITS TABS tablet Take 400 Units by mouth every morning.   Yes Historical Provider, MD  colesevelam (WELCHOL) 625 MG tablet Take 1,875 mg by mouth 2 (two) times daily. Three pills taken per dosage. Total of six per day.   Yes Historical Provider, MD  diphenhydrAMINE (BENADRYL) 25 MG tablet Take 25 mg by mouth every 6 (six) hours as needed for itching (itching).   Yes Historical Provider, MD  DULoxetine (CYMBALTA) 60 MG capsule Take 60 mg by mouth every morning.    Yes Historical Provider, MD  esomeprazole (NEXIUM) 40 MG capsule Take 40 mg by mouth daily before breakfast.    Yes Historical Provider, MD  ezetimibe (ZETIA) 10 MG tablet Take 10 mg by mouth at bedtime.    Yes Historical Provider, MD  ferrous sulfate 325 (65 FE) MG tablet Take 325 mg by mouth 2 (two) times daily.   Yes Historical Provider, MD  furosemide (LASIX) 80 MG tablet Take 1 tablet (80 mg total) by mouth daily. 12/31/13  Yes Penny Pia, MD  gabapentin (NEURONTIN) 300 MG capsule Take 300 mg by mouth at bedtime.    Yes Historical Provider, MD  insulin detemir (LEVEMIR) 100 UNIT/ML injection Inject 0.2 mLs (20 Units total) into the skin every morning. 12/31/13  Yes Penny Pia, MD  insulin lispro (HUMALOG) 100 UNIT/ML injection Inject 0.03 mLs (  3 Units total) into the skin daily with supper. Patient taking differently: Inject 3 Units into the skin daily with supper. Only give it 50% of supper is eaten 12/31/13  Yes Penny Pia, MD  lactulose (CHRONULAC) 10 GM/15ML solution Take 20 g by mouth 3 (three) times daily as needed for mild constipation  (constipation).    Yes Historical Provider, MD  meloxicam (MOBIC) 7.5 MG tablet Take 7.5 mg by mouth every morning.  02/19/13  Yes Historical Provider, MD  omeprazole (PRILOSEC) 40 MG capsule Take 40 mg by mouth daily.   Yes Historical Provider, MD  propranolol (INDERAL) 10 MG tablet Take 10 mg by mouth 2 (two) times daily.  02/20/13  Yes Historical Provider, MD  rifaximin (XIFAXAN) 550 MG TABS tablet Take 550 mg by mouth 2 (two) times daily.   Yes Historical Provider, MD  spironolactone (ALDACTONE) 100 MG tablet Take 1 tablet (100 mg total) by mouth 2 (two) times daily. 12/31/13  Yes Penny Pia, MD  ALPRAZolam Prudy Feeler) 0.5 MG tablet Take 0.5 mg by mouth at bedtime as needed for anxiety.    Historical Provider, MD   Allergies  Allergen Reactions  . Latex Rash  . Actos [Pioglitazone] Swelling and Other (See Comments)    edema Edema   . Byetta 10 Mcg Pen [Exenatide] Other (See Comments)    Stomach upset  . Crestor [Rosuvastatin] Other (See Comments)    Other reaction(s): Liver Disorder Elevated LFT's Increases LFT  . Lipitor [Atorvastatin] Other (See Comments)    Other reaction(s): Muscle Pain Muscle aches  . Other Other (See Comments)    Stomach upset  . Penicillin G Hives  . Penicillins Hives and Itching    All over the body.    FAMILY HISTORY:  indicated that her mother is alive. She indicated that her father is alive. She indicated that her sister is alive. She indicated that her brother is alive.  SOCIAL HISTORY:  reports that she quit smoking about 8 years ago. She has never used smokeless tobacco. She reports that she does not drink alcohol or use illicit drugs.  REVIEW OF SYSTEMS:  Cannot obtain due to confusion  SUBJECTIVE:   VITAL SIGNS: Temp:  [95.5 F (35.3 C)-99.5 F (37.5 C)] 97.9 F (36.6 C) (11/30 0800) Pulse Rate:  [81-120] 100 (11/30 0800) Resp:  [13-32] 20 (11/30 0800) BP: (75-160)/(20-137) 108/44 mmHg (11/30 0800) SpO2:  [96 %-100 %] 97 % (11/30  0800) Weight:  [80.2 kg (176 lb 12.9 oz)-83.008 kg (183 lb)] 80.2 kg (176 lb 12.9 oz) (11/29 1700) HEMODYNAMICS:   VENTILATOR SETTINGS:   INTAKE / OUTPUT:  Intake/Output Summary (Last 24 hours) at 02/13/14 0942 Last data filed at 02/13/14 0825  Gross per 24 hour  Intake 1736.25 ml  Output   1725 ml  Net  11.25 ml    PHYSICAL EXAMINATION: Gen: chronically ill appearing, moaning HEENT: NCAT, scleral icterus, dry mouth, dried blood in oropharynx PULM: CTA R, diminished somewhat left base CV: RRR, no mgr, no JVD AB: mildly distended, BS+, soft, nontender, no hsm Ext: cool, no edema, no clubbing, no cyanosis Derm: bruising all over, no rash or skin breakdown Neuro: somnolent, moaning, does not follow commands, withdraws to pain  LABS:  CBC  Recent Labs Lab 01/18/2014 1154 02/13/14 0206 02/13/14 0545  WBC 7.4 17.3* 15.0*  HGB 15.2* 14.4 13.4  HCT 42.3 39.6 37.9  PLT 123* PLATELET CLUMPS NOTED ON SMEAR, COUNT APPEARS ADEQUATE 107*   Coag's  Recent Labs Lab  01/25/2014 1258 02/13/14 0400 02/13/14 0545  APTT 49*  --  75*  INR 1.88* 3.31* 3.69*   BMET  Recent Labs Lab 01/28/2014 1154 02/13/14 0400 02/13/14 0545  NA 133* 129* 133*  K 4.5 5.1 5.5*  CL 99 96 100  CO2 20 15* 15*  BUN 18 26* 27*  CREATININE 0.85 1.19* 1.36*  GLUCOSE 238* 170* 166*   Electrolytes  Recent Labs Lab 02/01/2014 1154 02/13/14 0400 02/13/14 0545  CALCIUM 9.6 9.3 9.0   Sepsis Markers  Recent Labs Lab 02/02/2014 1259  LATICACIDVEN 3.5*   ABG  Recent Labs Lab 01/26/2014 1924  PHART 7.465*  PCO2ART 25.9*  PO2ART 60.2*   Liver Enzymes  Recent Labs Lab 01/16/2014 1154 02/13/14 0400 02/13/14 0545  AST 61* 50* 49*  ALT 49* 41* 39*  ALKPHOS 100 93 88  BILITOT 7.3* 7.7* 6.9*  ALBUMIN 2.3* 1.9* 1.8*   Cardiac Enzymes No results for input(s): TROPONINI, PROBNP in the last 168 hours. Glucose  Recent Labs Lab 01/15/2014 1136 01/15/2014 1751  GLUCAP 199* 198*    Imaging Dg  Chest 2 View  01/25/2014   CLINICAL DATA:  Altered mental status. Marked abdominal pain. Abdominal distention.  EXAM: CHEST  2 VIEW  COMPARISON:  01/02/2014.  FINDINGS: Large left pleural effusion. The visualized underlying lung appears normally pneumatized. The right lung is clear with stable prominence of the interstitial markings. The heart remains grossly normal in size. Thoracic spine degenerative changes.  IMPRESSION: 1. Large left pleural effusion. 2. Stable chronic interstitial lung disease.   Electronically Signed   By: Gordan PaymentSteve  Reid M.D.   On: 01/27/2014 14:03   Ct Head Wo Contrast  02/10/2014   CLINICAL DATA:  Altered mental status for the past 36 hr. Epistaxis.  EXAM: CT HEAD WITHOUT CONTRAST  TECHNIQUE: Contiguous axial images were obtained from the base of the skull through the vertex without intravenous contrast.  COMPARISON:  06/06/2007.  FINDINGS: There are motion artifacts on 2 sets of images. Normal appearing cerebral hemispheres and posterior fossa structures. Normal size and position of the ventricles. No intracranial hemorrhage, mass lesion or CT evidence of acute infarction. Unremarkable bones and included paranasal sinuses. Mild left cavernous internal carotid artery atheromatous calcifications.  IMPRESSION: No acute abnormality.   Electronically Signed   By: Gordan PaymentSteve  Reid M.D.   On: 02/01/2014 13:53     ASSESSMENT / PLAN:  PULMONARY OETT N/A A: At risk for aspiration Pleural effusion> presumably hepatic hydrothorax P:   -closely monitor respiratory status, may need intubation for airway protection  CARDIOVASCULAR CVL 11/30 A: Shock> hemorrhagic vs septic No history of heart disease P:  -levophed for MAP > 65 -CVP monitoring -tele -volume resuscitate now (FFP)  RENAL A:  Acute metabolic acidosis > likely lactic acidosis P:   -check lactic acid -Monitor BMET and UOP -Replace electrolytes as needed   GASTROINTESTINAL A:  Decompensated NASH cirrhosis, unclear  initial source of decompensation GI Bleeding> unclear if upper or lower Ascites  Have discussed with [facility] 11/30 P:   -continue PPI bid -octreotide gtt -aggressively correct coagulopathy -GI to see today> endoscopy?  HEMATOLOGIC A:  Coagulopathy due to cirrhosis P:  -FFP now -serial H/H and PT/INR -transfusion goal Hgb < 8 gm/dL if actively bleeding  INFECTIOUS A:  Septic shock? No clear source of infection right now but ddx includes SBP and HCAP (less likely) P:   BCx2 11/29 > UC 11/29 >>  Vanc 11/29 >> Cipro 11/29 >> Aztreonam  11/29 >> Rifaximin 550 bid 11/29 >>  ENDOCRINE A:  DM2 P:   -monitor glucose -SSI q4h  NEUROLOGIC A:  Acute encephalopathy most likely due to hepatic encephalopathy P:   -no sedating medications -may need intubation -lactulose > per tube if intubated   FAMILY  - Updates: daughter and husband updated at bedside 11/30 by me  Code status> full, family would not want more than 2-3 days of life support  TODAY'S SUMMARY: 58 y/o female with decompensated cirrhosis, hepatic encephalopathy, GI bleed, possible septic shock.  Will likely need mechanical ventilation and possible endoscopy this morning.   CC time by me 60 minutes  Heber CarolinaBrent McQuaid, MD [facility] Pager: [phone] Cell: [phone] If no response, call [phone] 02/13/2014, 9:42 AM

## 2014-02-13 NOTE — Progress Notes (Signed)
eLink Physician-Brief Progress Note Patient Name: Derenda FennelVicky S Trego DOB: 02/25/1956 MRN: [id number]   Date of Service  02/13/2014  HPI/Events of Note    eICU Interventions  Kayexalate given for K 6.1      Intervention Category Intermediate Interventions: Other:  Lynisha Osuch S. 02/13/2014, 5:51 PM

## 2014-02-13 NOTE — Progress Notes (Signed)
[facility] Progress Note  Subjective: The patient is seen in follow-up today. She has a history of Nash cirrhosis. She is on the liver transplant list at [facility]. Chart reviewed. She remains obtunded. She was seen by critical care this morning who plans to intubate her. She has continued to experience rectal bleeding throughout the night. My partner was called last night about this and octreotide was started. We do not have an etiology for the bleeding at this time. Despite her having rectal bleeding all night her hemoglobin at 10:55 AM today was 13.5 and her hematocrit 38.7. This lack of significant drop in hemoglobin and hematocrit with rectal bleeding all night suggests that this is not a variceal bleed. She had an EGD in 2013 which showed small esophageal varices. She had a colonoscopy in 2011 which did not show diverticulosis.  Objective: Vital signs in last 24 hours: Temp:  [95.5 F (35.3 C)-99.5 F (37.5 C)] 97 F (36.1 C) (11/30 1156) Pulse Rate:  [81-120] 97 (11/30 1156) Resp:  [13-32] 20 (11/30 1156) BP: (43-160)/(11-137) 68/19 mmHg (11/30 1156) SpO2:  [76 %-100 %] 97 % (11/30 1156) Weight:  [80.2 kg (176 lb 12.9 oz)] 80.2 kg (176 lb 12.9 oz) (11/29 1700) Weight change:    PE  Obtunded  Jaundiced  Heart regular rhythm  Lungs with diminished sounds  Abdomen, ascites, no obvious tenderness  Most recent bilirubin 7.7, AST 50, ALT 41, prothrombin time 36.9, INR 3.69  Lab Results: Results for orders placed or performed during the hospital encounter of 02/03/2014 (from the past 24 hour(s))  I-stat troponin, ED (only if pt is 60 y.o. or older & pain is above umbilicus) - do not order at [facility]     Status: None   Collection Time: 01/16/2014 12:15 PM  Result Value Ref Range   Troponin i, poc 0.00 0.00 - 0.08 ng/mL   Comment 3          Ammonia     Status: Abnormal   Collection Time: 01/31/2014 12:58 PM  Result Value Ref Range   Ammonia 67 (H) 11 - 60 umol/L  Protime-INR      Status: Abnormal   Collection Time: 01/19/2014 12:58 PM  Result Value Ref Range   Prothrombin Time 21.8 (H) 11.6 - 15.2 seconds   INR 1.88 (H) 0.00 - 1.49  APTT     Status: Abnormal   Collection Time: 02/01/2014 12:58 PM  Result Value Ref Range   aPTT 49 (H) 24 - 37 seconds  Lactic acid, plasma     Status: Abnormal   Collection Time: 02/11/2014 12:59 PM  Result Value Ref Range   Lactic Acid, Venous 3.5 (H) 0.5 - 2.2 mmol/L  POC occult blood, ED     Status: Abnormal   Collection Time: 01/31/2014  2:30 PM  Result Value Ref Range   Fecal Occult Bld POSITIVE (A) NEGATIVE  Culture, blood (routine x 2)     Status: None (Preliminary result)   Collection Time: 01/23/2014  2:34 PM  Result Value Ref Range   Specimen Description BLOOD LEFT ARM  4 ML IN [facility] BOTTLE    Special Requests Immunocompromised    Culture  Setup Time      01/18/2014 17:48 Performed at [facility]    Culture             BLOOD CULTURE RECEIVED NO GROWTH TO DATE CULTURE WILL BE HELD FOR 5 DAYS BEFORE ISSUING A FINAL NEGATIVE REPORT Performed at [facility]    Report Status PENDING   Urinalysis, Routine w reflex microscopic     Status: Abnormal   Collection Time: 02/11/2014  2:39 PM  Result Value Ref Range   Color, Urine AMBER (A) YELLOW   APPearance CLOUDY (A) CLEAR   Specific Gravity, Urine 1.036 (H) 1.005 - 1.030   pH 5.0 5.0 - 8.0   Glucose, UA NEGATIVE NEGATIVE mg/dL   Hgb urine dipstick NEGATIVE NEGATIVE   Bilirubin Urine NEGATIVE NEGATIVE   Ketones, ur 15 (A) NEGATIVE mg/dL   Protein, ur NEGATIVE NEGATIVE mg/dL   Urobilinogen, UA 1.0 0.0 - 1.0 mg/dL   Nitrite POSITIVE (A) NEGATIVE   Leukocytes, UA SMALL (A) NEGATIVE  Urine microscopic-add on     Status: Abnormal   Collection Time: 02/08/2014  2:39 PM  Result Value Ref Range   Squamous Epithelial / LPF RARE RARE   WBC, UA 0-2 <3 WBC/hpf   RBC / HPF 0-2 <3 RBC/hpf   Casts HYALINE CASTS (A) NEGATIVE   Crystals CA OXALATE CRYSTALS (A) NEGATIVE    Urine-Other MUCOUS PRESENT   MRSA PCR Screening     Status: None   Collection Time: 02/06/2014  4:32 PM  Result Value Ref Range   MRSA by PCR NEGATIVE NEGATIVE  Glucose, capillary     Status: Abnormal   Collection Time: 01/30/2014  5:51 PM  Result Value Ref Range   Glucose-Capillary 198 (H) 70 - 99 mg/dL   Comment 1 Documented in Chart    Comment 2 Notify RN   Blood gas, arterial     Status: Abnormal   Collection Time: 01/25/2014  7:24 PM  Result Value Ref Range   FIO2 0.21 %   pH, Arterial 7.465 (H) 7.350 - 7.450   pCO2 arterial 25.9 (L) 35.0 - 45.0 mmHg   pO2, Arterial 60.2 (L) 80.0 - 100.0 mmHg   Bicarbonate 18.4 (L) 20.0 - 24.0 mEq/L   TCO2 16.1 0 - 100 mmol/L   Acid-base deficit 3.5 (H) 0.0 - 2.0 mmol/L   O2 Saturation 90.6 %   Patient temperature 98.7    Collection site RIGHT RADIAL    Drawn by [phone]    Sample type ARTERIAL DRAW    Allens test (pass/fail) PASS PASS  CBC     Status: Abnormal   Collection Time: 02/13/14  2:06 AM  Result Value Ref Range   WBC 17.3 (H) 4.0 - 10.5 K/uL   RBC 4.03 3.87 - 5.11 MIL/uL   Hemoglobin 14.4 12.0 - 15.0 g/dL   HCT 19.139.6 47.836.0 - 29.546.0 %   MCV 98.3 78.0 - 100.0 fL   MCH 35.7 (H) 26.0 - 34.0 pg   MCHC 36.4 (H) 30.0 - 36.0 g/dL   RDW 62.116.2 (H) 30.811.5 - 65.715.5 %   Platelets  150 - 400 K/uL    PLATELET CLUMPS NOTED ON SMEAR, COUNT APPEARS ADEQUATE  Comprehensive metabolic panel     Status: Abnormal   Collection Time: 02/13/14  4:00 AM  Result Value Ref Range   Sodium 129 (L) 137 - 147 mEq/L   Potassium 5.1 3.7 - 5.3 mEq/L   Chloride 96 96 - 112 mEq/L   CO2 15 (L) 19 - 32 mEq/L   Glucose, Bld 170 (H) 70 - 99 mg/dL   BUN 26 (H) 6 - 23 mg/dL   Creatinine, Ser 8.461.19 (H) 0.50 - 1.10 mg/dL   Calcium 9.3 8.4 - 96.210.5 mg/dL   Total Protein 6.7 6.0 - 8.3 g/dL  Albumin 1.9 (L) 3.5 - 5.2 g/dL   AST 50 (H) 0 - 37 U/L   ALT 41 (H) 0 - 35 U/L   Alkaline Phosphatase 93 39 - 117 U/L   Total Bilirubin 7.7 (H) 0.3 - 1.2 mg/dL   GFR calc non Af Amer 49 (L)  >90 mL/min   GFR calc Af Amer 56 (L) >90 mL/min   Anion gap 18 (H) 5 - 15  Protime-INR     Status: Abnormal   Collection Time: 02/13/14  4:00 AM  Result Value Ref Range   Prothrombin Time 33.9 (H) 11.6 - 15.2 seconds   INR 3.31 (H) 0.00 - 1.49  CBC with Differential     Status: Abnormal   Collection Time: 02/13/14  5:45 AM  Result Value Ref Range   WBC 15.0 (H) 4.0 - 10.5 K/uL   RBC 3.82 (L) 3.87 - 5.11 MIL/uL   Hemoglobin 13.4 12.0 - 15.0 g/dL   HCT 40.137.9 02.736.0 - 25.346.0 %   MCV 99.2 78.0 - 100.0 fL   MCH 35.1 (H) 26.0 - 34.0 pg   MCHC 35.4 30.0 - 36.0 g/dL   RDW 66.416.3 (H) 40.311.5 - 47.415.5 %   Platelets 107 (L) 150 - 400 K/uL   Neutrophils Relative % 67 43 - 77 %   Lymphocytes Relative 18 12 - 46 %   Monocytes Relative 15 (H) 3 - 12 %   Eosinophils Relative 0 0 - 5 %   Basophils Relative 0 0 - 1 %   Neutro Abs 10.0 (H) 1.7 - 7.7 K/uL   Lymphs Abs 2.7 0.7 - 4.0 K/uL   Monocytes Absolute 2.3 (H) 0.1 - 1.0 K/uL   Eosinophils Absolute 0.0 0.0 - 0.7 K/uL   Basophils Absolute 0.0 0.0 - 0.1 K/uL   WBC Morphology MILD LEFT SHIFT (1-5% METAS, OCC MYELO, OCC BANDS)   Comprehensive metabolic panel     Status: Abnormal   Collection Time: 02/13/14  5:45 AM  Result Value Ref Range   Sodium 133 (L) 137 - 147 mEq/L   Potassium 5.5 (H) 3.7 - 5.3 mEq/L   Chloride 100 96 - 112 mEq/L   CO2 15 (L) 19 - 32 mEq/L   Glucose, Bld 166 (H) 70 - 99 mg/dL   BUN 27 (H) 6 - 23 mg/dL   Creatinine, Ser 2.591.36 (H) 0.50 - 1.10 mg/dL   Calcium 9.0 8.4 - 56.310.5 mg/dL   Total Protein 6.0 6.0 - 8.3 g/dL   Albumin 1.8 (L) 3.5 - 5.2 g/dL   AST 49 (H) 0 - 37 U/L   ALT 39 (H) 0 - 35 U/L   Alkaline Phosphatase 88 39 - 117 U/L   Total Bilirubin 6.9 (H) 0.3 - 1.2 mg/dL   GFR calc non Af Amer 41 (L) >90 mL/min   GFR calc Af Amer 48 (L) >90 mL/min   Anion gap 18 (H) 5 - 15  Protime-INR     Status: Abnormal   Collection Time: 02/13/14  5:45 AM  Result Value Ref Range   Prothrombin Time 36.9 (H) 11.6 - 15.2 seconds   INR  3.69 (H) 0.00 - 1.49  APTT     Status: Abnormal   Collection Time: 02/13/14  5:45 AM  Result Value Ref Range   aPTT 75 (H) 24 - 37 seconds  ABO/Rh     Status: None   Collection Time: 02/13/14  5:50 AM  Result Value Ref Range   ABO/RH(D) A NEG  Prepare Pheresed Platelets     Status: None   Collection Time: 02/13/14  6:00 AM  Result Value Ref Range   Unit Number [id number]    Blood Component Type PLTPHER LR1    Unit division 00    Status of Unit REL FROM [facility]    Transfusion Status OK TO TRANSFUSE    Unit Number [id number]    Blood Component Type PLTPHER LR2    Unit division 00    Status of Unit REL FROM [facility]    Transfusion Status OK TO TRANSFUSE   Type and screen     Status: None   Collection Time: 02/13/14  6:40 AM  Result Value Ref Range   ABO/RH(D) A NEG    Antibody Screen NEG    Sample Expiration 02/16/2014   Blood gas, arterial     Status: Abnormal   Collection Time: 02/13/14  9:44 AM  Result Value Ref Range   FIO2 0.21 %   pH, Arterial 7.295 (L) 7.350 - 7.450   pCO2 arterial 27.0 (L) 35.0 - 45.0 mmHg   pO2, Arterial 63.1 (L) 80.0 - 100.0 mmHg   Bicarbonate 12.7 (L) 20.0 - 24.0 mEq/L   TCO2 11.5 0 - 100 mmol/L   Acid-base deficit 12.1 (H) 0.0 - 2.0 mmol/L   O2 Saturation 88.1 %   Patient temperature 98.6    Collection site RIGHT RADIAL    Drawn by [id number]    Sample type ARTERIAL DRAW    Allens test (pass/fail) PASS PASS  Prepare fresh frozen plasma     Status: None (Preliminary result)   Collection Time: 02/13/14 10:00 AM  Result Value Ref Range   Unit Number [id number]    Blood Component Type THW PLS APHR    Unit division C0    Status of Unit ALLOCATED    Transfusion Status OK TO TRANSFUSE    Unit Number [id number]    Blood Component Type THW PLS APHR    Unit division B0    Status of Unit ALLOCATED    Transfusion Status OK TO TRANSFUSE   CBC     Status: Abnormal   Collection Time: 02/13/14 10:55 AM  Result Value Ref Range   WBC 16.7 (H)  4.0 - 10.5 K/uL   RBC 3.85 (L) 3.87 - 5.11 MIL/uL   Hemoglobin 13.5 12.0 - 15.0 g/dL   HCT 62.8 31.5 - 17.6 %   MCV 100.5 (H) 78.0 - 100.0 fL   MCH 35.1 (H) 26.0 - 34.0 pg   MCHC 34.9 30.0 - 36.0 g/dL   RDW 16.0 (H) 73.7 - 10.6 %   Platelets 118 (L) 150 - 400 K/uL  Lactic acid, plasma     Status: Abnormal   Collection Time: 02/13/14 10:55 AM  Result Value Ref Range   Lactic Acid, Venous 10.2 (H) 0.5 - 2.2 mmol/L    Studies/Results: Dg Chest 2 View  01/17/2014   CLINICAL DATA:  Altered mental status. Marked abdominal pain. Abdominal distention.  EXAM: CHEST  2 VIEW  COMPARISON:  01/02/2014.  FINDINGS: Large left pleural effusion. The visualized underlying lung appears normally pneumatized. The right lung is clear with stable prominence of the interstitial markings. The heart remains grossly normal in size. Thoracic spine degenerative changes.  IMPRESSION: 1. Large left pleural effusion. 2. Stable chronic interstitial lung disease.   Electronically Signed   By: Gordan Payment M.D.   On: 02/11/2014 14:03   Ct Head Wo Contrast  01/28/2014   CLINICAL DATA:  Altered mental status for the past 36 hr. Epistaxis.  EXAM: CT HEAD WITHOUT CONTRAST  TECHNIQUE: Contiguous axial images were obtained from the base of the skull through the vertex without intravenous contrast.  COMPARISON:  06/06/2007.  FINDINGS: There are motion artifacts on 2 sets of images. Normal appearing cerebral hemispheres and posterior fossa structures. Normal size and position of the ventricles. No intracranial hemorrhage, mass lesion or CT evidence of acute infarction. Unremarkable bones and included paranasal sinuses. Mild left cavernous internal carotid artery atheromatous calcifications.  IMPRESSION: No acute abnormality.   Electronically Signed   By: Gordan Payment M.D.   On: 02/09/2014 13:53      Assessment: #1. Nash cirrhosis, recently decompensated clinical condition, with hepatic encephalopathy. The etiology of the  decompensation is unclear at this time.  #2. Rectal bleeding. With the ongoing rectal bleeding that she has had overnight, and lack of drop in hemoglobin or hematocrit this does not suggest a variceal bleed. As mentioned above a colonoscopy in 2011 did not show diverticulosis.  Plan: Continue current management. Continue antibiotics. Patient is going to be intubated. Continue octreotide. Try to correct INR with FFP. I do not feel endoscopy today would add anything to the therapeutic plan.    Algis Lehenbauer F 02/13/2014, 12:01 PM

## 2014-02-13 NOTE — Progress Notes (Signed)
Paged Gastroenterologist per [facility] request.   Advised Hgb drop from 15.2 at 11:54 to 14.4 2:06, elevated WBC from 7.4 at 11:54 to 17.3 at 2:06.  Dr. Matthias HughsBuccini, ordered Octreotide at 50 mcg/hr.

## 2014-02-13 NOTE — Procedures (Signed)
Intubation Procedure Note [name] [id number] July 10, 1955  Procedure: Intubation Indications: Respiratory insufficiency  Procedure Details Consent: Risks of procedure as well as the alternatives and risks of each were explained to the (patient/caregiver).  Consent for procedure obtained. Time Out: Verified patient identification, verified procedure, site/side was marked, verified correct patient position, special equipment/implants available, medications/allergies/relevent history reviewed, required imaging and test results available.  Performed  Drugs versed 2mg , fentanyl 50mcg, Etomidate 20mg  DL x 1 with GS3 blade Grade 1 view 7.5 tube passed through cords under direct visualization Placement confirmed with bilateral breath sounds, positive EtCO2 change and smoke in tube   Evaluation Hemodynamic Status: BP stable throughout; O2 sats: transiently fell during during procedure and currently acceptable Patient's Current Condition: stable Complications: No apparent complications Patient did tolerate procedure well. Chest X-ray ordered to verify placement.  CXR: pending.   Galvan, Michelle 02/13/2014

## 2014-02-13 NOTE — Progress Notes (Addendum)
Nutrition Brief Note  Malnutrition Screening Tool result is inaccurate.  RD corrected screen. Please consult if nutrition needs are identified.  Porsche Noguchi F Rajeev Escue MS RD LDN Clinical Dietitian Pager:[phone]  

## 2014-02-13 NOTE — Progress Notes (Signed)
ANTIBIOTIC CONSULT NOTE - Follow Up  Pharmacy Consult for Aztreonam, Vancomycin, Cipro Indication: rule out sepsis, SBP  Allergies  Allergen Reactions  . Latex Rash  . Actos [Pioglitazone] Swelling and Other (See Comments)    edema Edema   . Byetta 10 Mcg Pen [Exenatide] Other (See Comments)    Stomach upset  . Crestor [Rosuvastatin] Other (See Comments)    Other reaction(s): Liver Disorder Elevated LFT's Increases LFT  . Lipitor [Atorvastatin] Other (See Comments)    Other reaction(s): Muscle Pain Muscle aches  . Other Other (See Comments)    Stomach upset  . Penicillin G Hives  . Penicillins Hives and Itching    All over the body.    Patient Measurements: Height: 5\' 7"  (170.2 cm) Weight: 176 lb 12.9 oz (80.2 kg) IBW/kg (Calculated) : 61.6  Vital Signs: Temp: 97.5 F (36.4 C) (11/30 1000) BP: 101/51 mmHg (11/30 1000) Pulse Rate: 96 (11/30 1000) Intake/Output from previous day: 11/29 0701 - 11/30 0700 In: 1686.3 [I.V.:186.3; IV Piggyback:500] Out: 1725 [Urine:250; Stool:1475]  Labs:  Recent Labs  01/21/2014 1154 02/13/14 0206 02/13/14 0400 02/13/14 0545  WBC 7.4 17.3*  --  15.0*  HGB 15.2* 14.4  --  13.4  PLT 123* PLATELET CLUMPS NOTED ON SMEAR, COUNT APPEARS ADEQUATE  --  107*  CREATININE 0.85  --  1.19* 1.36*   Estimated Creatinine Clearance: 47.9 mL/min (by C-G formula based on Cr of 1.36). No results for input(s): VANCOTROUGH, VANCOPEAK, VANCORANDOM, GENTTROUGH, GENTPEAK, GENTRANDOM, TOBRATROUGH, TOBRAPEAK, TOBRARND, AMIKACINPEAK, AMIKACINTROU, AMIKACIN in the last 72 hours.    Assessment: [age] yoF presented to ED on 11/29 with AMS as reported by husband.PMH includes diabetes, cirrhosis, encephalopathy, NASH, [facility], and COPD.S/p thoracentesis on 12/19/13 and paracentesis on 12/16/13.Husband also reports intermittent nosebleed, blood in stool, and lactulose q4h (ammonia = 67) today.Pt reports abdominal pain.Pharmacy is consulted  to dose Cipro, aztreonam, and vancomycin for suspected sepsis, SBP.  Allergy: PCN (Hives).  11/29 >> Levaquin x 1 (switch to cipro) 11/30 >> Cipro >> 11/29 >> Aztreonam >>  11/29 >> Vanc >>  Temp: Hypothermic on admission (95.5) WBCs: increasing, 7.4 > 15 Renal: AKI (BP dropping, pressors started), SCr 0.85 > 1.36, CrCl ~ 55 ml/min CG, ~49 ml/min (normalized)  Lactic acid 3.5  Micro: 11/29 blood x2: sent 11/29 urine: sent 11/29 MRSA screen: neg  Today is day #2 Levaquin 750mg  x1 then cipro 400 q12h, aztreonam 2g IV q8h, vanc 1g q12h for sepsis, possible sources SBP, UTI.  Goal of Therapy:  Vancomycin trough level 15-20 mcg/ml Appropriate abx dosing, eradication of infection.   Plan:  1.  Reduce vancomycin to 750 mg IV q12h for worsening SCr. 2.  Continue Aztreonam 2g IV q8h. 3.  Continue Cipro 400 mg IV q12h. 4.  F/u SCr, trough levels, clinical course.  Clance BollAmanda Yazeed Pryer, PharmD, BCPS Pager: [phone] 02/13/2014 10:23 AM

## 2014-02-13 NOTE — Progress Notes (Signed)
Dr Matthias HughsBuccini paged regarding drop in B/P, orders for 500 cc bolus, 2 units FFP, type & Screen, CBC Dif,CMET and PT given.   [facility] paged to update status to ICU. [facility] came to bedside and spoke with husband.

## 2014-02-13 NOTE — Progress Notes (Signed)
[facility] hospitalist progress note. Chief complaint. Rectal bleeding. History of present illness. This 58 year old female with past medical history of NASH, liver cirrhosis was admitted with altered mental status and noted blood clots in her stool. Arterial blood gas was obtained and found pH 7.465, PCO2 25.9, PO2 60.2, bicarbonate 18.4. Clearly CO2 retention not responsible for altered mental status. Patient was found to be auto anticoagulated and was given 5 mg of vitamin K. Her admitting hemoglobin was 15.2. Patient has a rectal tube in place and nursing noted patient began having bright red bleeding into the collection bag. Estimates were of at least 250 cc of bright red blood. Gastroenterology was contacted and initiated a octreotide drip. A repeat hemoglobin was obtained and resulted roughly stable at 14.4. Finally the patient became somewhat hypotensive with systolic in the 70s and I ordered a 500 cc normal saline bolus. Physical exam. Vital signs. Temperature 98.2, pulse 107, respiration 19, blood pressure 81/47. O2 sats 99%. General appearance. Frail elderly female who is essentially obtunded. Cardiac. Rate and rhythm regular. Lungs. Breath sounds clear and equal. Abdomen. Soft with positive bowel sounds. Diffuse pain with palpation. Impression/plan. Problem #1. GI bleed. This in the setting of auto anticoagulation due to advanced liver disease. As per my discussion with Dr. Matthias HughsBuccini gastroenterology will repeat a stat CBC, compensated metabolic panel, type and screen. We will transfuse 2 units of platelets. Continue on with octreotide drip. GI will see patient early this a.m. Problem #2. Hypotension. Patient's husband states that the patient normally runs somewhat low on blood pressure given her liver disease. This probably volume deficit from GI bleed. Post 500 cc normal saline bolus systolic blood pressure again in the 100-110 range. Platelet transfusion should also help her volume status. She  may benefit from packed red blood cells following platelet transfusion. Problem #3. Altered mental status. Likely hepatic encephalopathy given normal ABG. Patient unable to take oral lactulose and I provided a dose of rectal lactulose prior to bleeding. No change in her mental status at this point. CT scan of the brain done earlier in admission process was unremarkable.

## 2014-02-14 ENCOUNTER — Inpatient Hospital Stay (HOSPITAL_COMMUNITY): Payer: Managed Care, Other (non HMO)

## 2014-02-14 DIAGNOSIS — J9621 Acute and chronic respiratory failure with hypoxia: Secondary | ICD-10-CM

## 2014-02-14 DIAGNOSIS — A419 Sepsis, unspecified organism: Principal | ICD-10-CM

## 2014-02-14 DIAGNOSIS — K769 Liver disease, unspecified: Secondary | ICD-10-CM

## 2014-02-14 DIAGNOSIS — N179 Acute kidney failure, unspecified: Secondary | ICD-10-CM

## 2014-02-14 DIAGNOSIS — J948 Other specified pleural conditions: Secondary | ICD-10-CM

## 2014-02-14 DIAGNOSIS — R6521 Severe sepsis with septic shock: Secondary | ICD-10-CM

## 2014-02-14 DIAGNOSIS — G9340 Encephalopathy, unspecified: Secondary | ICD-10-CM

## 2014-02-14 LAB — COMPREHENSIVE METABOLIC PANEL
ALT: 584 U/L — AB (ref 0–35)
ANION GAP: 22 — AB (ref 5–15)
AST: 1101 U/L — ABNORMAL HIGH (ref 0–37)
Albumin: 1.6 g/dL — ABNORMAL LOW (ref 3.5–5.2)
Alkaline Phosphatase: 93 U/L (ref 39–117)
BUN: 34 mg/dL — ABNORMAL HIGH (ref 6–23)
CO2: 15 mEq/L — ABNORMAL LOW (ref 19–32)
CREATININE: 2.54 mg/dL — AB (ref 0.50–1.10)
Calcium: 7.7 mg/dL — ABNORMAL LOW (ref 8.4–10.5)
Chloride: 88 mEq/L — ABNORMAL LOW (ref 96–112)
GFR calc non Af Amer: 19 mL/min — ABNORMAL LOW (ref >90)
GFR, EST AFRICAN AMERICAN: 23 mL/min — AB (ref >90)
Glucose, Bld: 357 mg/dL — ABNORMAL HIGH (ref 70–99)
Potassium: 5.3 mEq/L (ref 3.7–5.3)
SODIUM: 125 meq/L — AB (ref 137–147)
TOTAL PROTEIN: 5.1 g/dL — AB (ref 6.0–8.3)
Total Bilirubin: 7 mg/dL — ABNORMAL HIGH (ref 0.3–1.2)

## 2014-02-14 LAB — CBC WITH DIFFERENTIAL/PLATELET
Basophils Absolute: 0.2 10*3/uL — ABNORMAL HIGH (ref 0.0–0.1)
Basophils Relative: 1 % (ref 0–1)
EOS PCT: 2 % (ref 0–5)
Eosinophils Absolute: 0.3 10*3/uL (ref 0.0–0.7)
HCT: 29.5 % — ABNORMAL LOW (ref 36.0–46.0)
Hemoglobin: 10.5 g/dL — ABNORMAL LOW (ref 12.0–15.0)
Lymphocytes Relative: 27 % (ref 12–46)
Lymphs Abs: 4.4 10*3/uL — ABNORMAL HIGH (ref 0.7–4.0)
MCH: 35.6 pg — ABNORMAL HIGH (ref 26.0–34.0)
MCHC: 35.6 g/dL (ref 30.0–36.0)
MCV: 100 fL (ref 78.0–100.0)
MONOS PCT: 19 % — AB (ref 3–12)
Monocytes Absolute: 3.1 10*3/uL — ABNORMAL HIGH (ref 0.1–1.0)
Neutro Abs: 8.3 10*3/uL — ABNORMAL HIGH (ref 1.7–7.7)
Neutrophils Relative %: 51 % (ref 43–77)
Platelets: 90 10*3/uL — ABNORMAL LOW (ref 150–400)
RBC: 2.95 MIL/uL — AB (ref 3.87–5.11)
RDW: 17 % — ABNORMAL HIGH (ref 11.5–15.5)
WBC: 16.3 10*3/uL — AB (ref 4.0–10.5)

## 2014-02-14 LAB — CBC
HCT: 32.6 % — ABNORMAL LOW (ref 36.0–46.0)
HCT: 26.3 % — ABNORMAL LOW (ref 36.0–46.0)
HEMOGLOBIN: 8.9 g/dL — AB (ref 12.0–15.0)
Hemoglobin: 11.5 g/dL — ABNORMAL LOW (ref 12.0–15.0)
MCH: 35.5 pg — ABNORMAL HIGH (ref 26.0–34.0)
MCH: 35.5 pg — ABNORMAL HIGH (ref 26.0–34.0)
MCHC: 35.3 g/dL (ref 30.0–36.0)
MCHC: 33.8 g/dL (ref 30.0–36.0)
MCV: 100.6 fL — AB (ref 78.0–100.0)
MCV: 104.8 fL — ABNORMAL HIGH (ref 78.0–100.0)
PLATELETS: 105 10*3/uL — AB (ref 150–400)
PLATELETS: 66 10*3/uL — AB (ref 150–400)
RBC: 3.24 MIL/uL — ABNORMAL LOW (ref 3.87–5.11)
RBC: 2.51 MIL/uL — AB (ref 3.87–5.11)
RDW: 17.1 % — ABNORMAL HIGH (ref 11.5–15.5)
RDW: 17.1 % — ABNORMAL HIGH (ref 11.5–15.5)
WBC: 14.4 10*3/uL — ABNORMAL HIGH (ref 4.0–10.5)
WBC: 15.6 10*3/uL — ABNORMAL HIGH (ref 4.0–10.5)

## 2014-02-14 LAB — URINE CULTURE
COLONY COUNT: NO GROWTH
CULTURE: NO GROWTH

## 2014-02-14 LAB — PREPARE FRESH FROZEN PLASMA

## 2014-02-14 LAB — GLUCOSE, CAPILLARY
GLUCOSE-CAPILLARY: 286 mg/dL — AB (ref 70–99)
GLUCOSE-CAPILLARY: 250 mg/dL — AB (ref 70–99)
Glucose-Capillary: 219 mg/dL — ABNORMAL HIGH (ref 70–99)
Glucose-Capillary: 280 mg/dL — ABNORMAL HIGH (ref 70–99)
Glucose-Capillary: 159 mg/dL — ABNORMAL HIGH (ref 70–99)
Glucose-Capillary: 89 mg/dL (ref 70–99)

## 2014-02-14 LAB — PROTIME-INR
INR: 5.55 — AB (ref 0.00–1.49)
INR: 4.35 — AB (ref 0.00–1.49)
PROTHROMBIN TIME: 50.7 s — AB (ref 11.6–15.2)
PROTHROMBIN TIME: 41.9 s — AB (ref 11.6–15.2)

## 2014-02-14 LAB — LACTIC ACID, PLASMA: LACTIC ACID, VENOUS: 10.8 mmol/L — AB (ref 0.5–2.2)

## 2014-02-14 MED ORDER — SODIUM CHLORIDE 0.9 % IV SOLN
Freq: Once | INTRAVENOUS | Status: AC
  Administered 2014-02-14: 09:00:00 via INTRAVENOUS

## 2014-02-14 MED ORDER — VITAMIN K1 10 MG/ML IJ SOLN
10.0000 mg | Freq: Once | INTRAVENOUS | Status: AC
  Administered 2014-02-14: 10 mg via INTRAVENOUS
  Filled 2014-02-14: qty 1

## 2014-02-14 MED ORDER — NOREPINEPHRINE BITARTRATE 1 MG/ML IV SOLN
0.0000 ug/min | INTRAVENOUS | Status: DC
  Administered 2014-02-14: 40 ug/min via INTRAVENOUS
  Filled 2014-02-14 (×2): qty 16

## 2014-02-14 MED ORDER — VASOPRESSIN 20 UNIT/ML IV SOLN
0.0300 [IU]/min | INTRAVENOUS | Status: DC
  Administered 2014-02-14: 0.03 [IU]/min via INTRAVENOUS
  Filled 2014-02-14: qty 2

## 2014-02-14 MED ORDER — MORPHINE SULFATE 10 MG/ML IJ SOLN
10.0000 mg/h | INTRAVENOUS | Status: DC
  Administered 2014-02-14: 10 mg/h via INTRAVENOUS
  Filled 2014-02-14: qty 10

## 2014-02-14 MED ORDER — MORPHINE SULFATE 10 MG/ML IJ SOLN: 10.0000 mg/h | INTRAVENOUS | Status: DC

## 2014-02-14 MED ORDER — DEXTROSE 5 % IV SOLN
1.0000 g | Freq: Three times a day (TID) | INTRAVENOUS | Status: DC
  Administered 2014-02-14: 1 g via INTRAVENOUS
  Filled 2014-02-14 (×2): qty 1

## 2014-02-14 MED ORDER — LORAZEPAM BOLUS VIA INFUSION
2.0000 mg | INTRAVENOUS | Status: DC | PRN
  Filled 2014-02-14: qty 5

## 2014-02-14 MED ORDER — MORPHINE SULFATE 10 MG/ML IJ SOLN
10.0000 mg/h | INTRAVENOUS | Status: DC
  Filled 2014-02-14: qty 10

## 2014-02-14 MED ORDER — ALBUMIN HUMAN 25 % IV SOLN
75.0000 g | Freq: Once | INTRAVENOUS | Status: AC
  Administered 2014-02-14: 75 g via INTRAVENOUS
  Filled 2014-02-14: qty 300

## 2014-02-14 MED ORDER — HYDROCORTISONE NA SUCCINATE PF 100 MG IJ SOLR
50.0000 mg | Freq: Four times a day (QID) | INTRAMUSCULAR | Status: DC
  Administered 2014-02-14 (×2): 50 mg via INTRAVENOUS
  Filled 2014-02-14 (×2): qty 2

## 2014-02-14 MED ORDER — LORAZEPAM 2 MG/ML IJ SOLN
5.0000 mg/h | INTRAVENOUS | Status: DC
  Administered 2014-02-14: 5 mg/h via INTRAVENOUS
  Filled 2014-02-14: qty 25

## 2014-02-14 MED ORDER — SODIUM CHLORIDE 0.9 % IV BOLUS (SEPSIS): 500.0000 mL | Freq: Once | INTRAVENOUS | Status: DC

## 2014-02-14 MED ORDER — SODIUM CHLORIDE 0.9 % IV SOLN
25.0000 ug/h | INTRAVENOUS | Status: DC
  Administered 2014-02-14: 50 ug/h via INTRAVENOUS
  Filled 2014-02-14: qty 50

## 2014-02-14 MED ORDER — CIPROFLOXACIN IN D5W 400 MG/200ML IV SOLN: 400.0000 mg | INTRAVENOUS | Status: DC

## 2014-02-14 MED ORDER — MORPHINE BOLUS VIA INFUSION
5.0000 mg | INTRAVENOUS | Status: DC | PRN
  Filled 2014-02-14: qty 20

## 2014-02-14 MED ORDER — VANCOMYCIN HCL IN DEXTROSE 1-5 GM/200ML-% IV SOLN: 1000.0000 mg | INTRAVENOUS | Status: DC

## 2014-02-14 DEATH — deceased

## 2014-02-15 ENCOUNTER — Ambulatory Visit (HOSPITAL_COMMUNITY): Admission: RE | Admit: 2014-02-15 | Payer: Managed Care, Other (non HMO) | Source: Ambulatory Visit

## 2014-02-15 LAB — PREPARE FRESH FROZEN PLASMA: UNIT DIVISION: 0

## 2014-02-17 NOTE — Discharge Summary (Signed)
NAMTimothy Lasso:  Matney, Frenchie               ACCOUNT NO.:  [id number]  MEDICAL RECORD NO.:  [id number]  LOCATION:  1228                         [facility]  PHYSICIAN:  Veto KempsUGLAS BRENT MCQUAID, MDDATE OF BIRTH:  Feb 05, 1956  DATE OF ADMISSION:  2013/03/31 DATE OF DISCHARGE:  03/07/2014                              DISCHARGE SUMMARY   DEATH NOTE:  CAUSE OF DEATH: 1. Septic shock. 2. Presumed supraventricular tachycardia. 3. Nonalcoholic steatohepatitis related cirrhosis.  HISTORY OF PRESENT ILLNESS:  This is a 58 year old female with NASH cirrhosis, who was [facility].  She was admitted on February 12, 2014, after her family noted that her mental status had changed 4 days prior to admission.  Previous to that, she had been okayed taking her lactulose and not complaining of fever or nausea.  She does not take narcotic pain medications or benzodiazepines. Her family noted that she had not been sleeping much in the last few days and she has not been able to carry on a lengthy conversation. Unfortunately, her condition deteriorated to the point where she was brought to the emergency department by her family on February 12, 2014. Since coming to the hospital, she had developed some rectal bleeding and worsening encephalopathy and shocks.  Pulmonary and Critical Care Medicine was consulted on February 13, 2014.  PAST MEDICAL HISTORY:  See the admission H and P.  FAMILY HISTORY:  See the admission H and P.  SOCIAL HISTORY:  See the admission H and P.  PHYSICAL EXAMINATION:  VITAL SIGNS:  On the day on February 13, 2014, temperature 97.9, heart rate 100, respirations 20, blood pressure 108/44, SpO2 is 97%. GENERAL:  Chronically-ill-appearing, moaning. HEENT:  [place]/AT.  Scleral icterus noted.  Dry mouth noted.  Dried blood in oropharynx. PULMONARY:  Clear to auscultation.  Diminished somewhat in the left base. CARDIOVASCULAR:  Regular rate and rhythm.  No  murmurs, gallops, or rubs. No JVD. ABDOMEN:  Mildly distended.  Bowel sounds are positive.  Soft and nontender.  No hepatosplenomegaly. EXTREMITIES:  Cool.  No edema, no clubbing, no cyanosis. DERMATOLOGY:  Bruising all over.  No rash or skin breakdown. NEUROLOGIC:  Somnolent, moaning, does not follow commands, but does not withdraw to pain.  HOSPITAL COURSE:  On February 13, 2014, after being admitted to the hospital for encephalopathy.  Pulmonary and Critical Care Medicine was consulted.  At this point, she developed worsening shock and encephalopathy and it was apparent that the patient was septic as she was hypothermic and profoundly tachycardic with worsening blood pressure control.  It was felt that she had SBP based on a clinical history versus healthcare associated pneumonia.  She was treated with broad- spectrum antibiotics.  She was intubated for respiratory insufficiency and a central line was placed for hemodynamic measurement.  We discussed the goals of care with the patient's family and they expressed that they felt she should receive aggressive care for about 2 days by considering her overall poor prognosis.  They did not want to prolong things.  So we continued treatment with IV fluids and antibiotics, as well as vasopressors for several days, but unfortunately 2 days after admission, her condition  deteriorated and the patient family requested that we not escalate care further.  She died peacefully with her family at the bedside on February 14, 2014.          ______________________________ Veto KempsUGLAS BRENT MCQUAID, MD     DBM/MEDQ  D:  02/16/2014  T:  02/16/2014  Job:  [id number]

## 2014-02-18 LAB — CULTURE, BLOOD (ROUTINE X 2)
CULTURE: NO GROWTH
Culture: NO GROWTH

## 2014-03-17 NOTE — Progress Notes (Signed)
Chaplain paged at 1710 and arrived at bedside at 1740. Ms Mercy RidingBurgess was surrounded by family and friends. The order to remove tubes from her throat was placed and all gathered were awaiting the RTs to come. Continued the spiritual care and grief support began by daytime chaplain. Chaplain was with family when Ms Mercy RidingBurgess died. Grief counsel and care continued with Husband, daughters and other family members.   Ms Mercy RidingBurgess died at peace.  Family wished to express their profound thanks to the nurses and staff, for their loving care, their compassion and for their truthful counsel. Mr Mercy RidingBurgess took time to thank the duty nurses and requested they pass on to all who cared for his wife his wholehearted thank you.  Benjie Karvonenharles D. Cecelia Graciano, DMin Chaplain

## 2014-03-17 NOTE — Progress Notes (Signed)
CRITICAL VALUE ALERT  Critical value received:  INR 5.55   Date of notification:  03/12/2014  Time of notification:  0626  Critical value read back:Yes.    Nurse who received alert:  S. Less Woolsey  MD notified (1st page):  Deterding  Time of first page:  0626 -called E-link  MD notified (2nd page):  Time of second page:  Responding MD:  Deterding Vinnie Langton- Gretchen, RN on behalf of Deterding  Time MD responded:  [phone]

## 2014-03-17 NOTE — Progress Notes (Signed)
RN requested for pharmacy to concentrated Levophed infusion bag due to high rate of infusion.  Pharmacy changed order for Levophed 4 mg in 250 mL D5W (single strength) to Levophed 16 mg in 250 mL D5W (quadruple strength).  Pharmacy hand delivered quad strength Levophed to patient's RN and communicated need to adjust infusion pump settings for quad strength Levophed.  RN verbalized understanding.  Clance BollAmanda Runyon, PharmD, BCPS Pager: [phone] 03/16/2014 9:21 AM

## 2014-03-17 NOTE — Procedures (Signed)
Extubation Procedure Note  Patient Details:   Name: [name] DOB: [birth year] MRN: [id number]   Airway Documentation:     Evaluation  O2 sats: currently acceptable Complications: No apparent complications Patient did tolerate procedure well. Bilateral Breath Sounds: Rhonchi Suctioning: Airway No  Suzan GaribaldiCraddock, Thomasenia Dowse Ann 03/15/2014, 6:09 PM

## 2014-03-17 NOTE — Progress Notes (Signed)
eLink Physician-Brief Progress Note Patient Name: Michelle FennelVicky S Galvan DOB: 1955-09-05 MRN: [id number]   Date of Service  03/09/2014  HPI/Events of Note  Family requesting comfort care measures and DNR status, see Dr. Kendrick FriesMcQuaid note for full details  eICU Interventions  Per discussion with Husband via camera, will initiate comfort care measure and terminal extubation. Patient is not DNR.         Chanita Boden 03/14/2014, 5:14 PM

## 2014-03-17 NOTE — Progress Notes (Signed)
Continued spiritual support with family at bedside.  Engaged in narrative counseling, anticipatory grief work.   Pt's daughter Maryln ManuelVickie Jo is distressed by mother's bodily condition (weeping fluid through skin, etc).  All at bedside repeat that they do not want pt to suffer.

## 2014-03-17 NOTE — Progress Notes (Signed)
Labs and clinical notes reviewed.  Unfortunately, the patient has had further clinical deterioration, characterized primarily by ongoing hypotension and now, progressive azotemia and oliguria. She remains on aggressive pressure support with full dose Levophed. She remains on ventilatory support, but without significant oxygenation problems. So far, no focus of sepsis has been identified.  In reviewing the note of the critical care physician from today, I see where it is felt that the patient is hemodynamically too unstable to consider [facility] emergency liver [facility], and I agree.   The patient's husband is at the bedside and seems very rational about this entire situation, and is at this time primarily interested in the patient's comfort, recognizing that it is unlikely that she will rally and improve to the point where transfer to [facility] is a realistic possibility.  The patient continues to have both upper and lower tract bleeding, characterized by bloody NG output and bloody stools which have tapered off. It appears that this is probably mucosal bleeding, because there has been no associated significant drop in hemoglobin.  At this time, I do not have any additional suggestions. Per the husband's suggestion and request, I have placed a call to [facility] liver [facility], to update them on the patient's condition. I am awaiting a call back at this time.  Florencia Reasonsobert V. Phoenix Riesen, M.D. [phone]

## 2014-03-17 NOTE — Progress Notes (Signed)
At 1720 called elink due to family wanting to have the breathing tube pulled and the ventilator discontinued.  At 1741 Morphine drip started at 10 mg/hr per order, and Ativan started at 5 mg/hr per order.  Family at bedside and aware of drips starting.  At 1805 ETT d/c'd per RT, per order.  Patient with no spont resp at 1810, no BP, no heartbeat auscultated.  Family at bedside and aware that patient has passed.  Elink doctor notified of patient's passing, and patient pronounced per [facility] RN, and Clinton Dragone [employer] RN.  Husband continues at bedside.  Makhya Arave Debroah LoopArnold RN

## 2014-03-17 NOTE — Progress Notes (Signed)
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  She developed shock and a GI bleed and PCCM was consulted on 11/30 for further management.  STUDIES:  11/29 CT head > no acute abnormality 11/30 US portable > cirrhotic liver, small volume ascites, small left effusion, s/p chole   SIGNIFICANT EVENTS:  SUBJECTIVE:  Worsening shock overnight, minimal rectal bleeding, epiglottic tube with blood  VITAL SIGNS: Temp:  [96.8 F (36 C)-99.1 F (37.3 C)] 99 F (37.2 C) (12/01 0700) Pulse Rate:  [87-117] 117 (12/01 0700) Resp:  [13-44] 24 (12/01 0700) BP: (43-163)/(11-89) 91/48 mmHg (12/01 0700) SpO2:  [76 %-100 %] 99 % (12/01 0700) FiO2 (%):  [30 %-60 %] 40 % (12/01 0700) HEMODYNAMICS: CVP:  [4 mmHg-13 mmHg] 8 mmHg VENTILATOR SETTINGS: Vent Mode:  [-] PRVC FiO2 (%):  [30 %-60 %] 40 % Set Rate:  [14 bmp] 14 bmp Vt Set:  [440 mL] 440 mL PEEP:  [5 cmH20-10 cmH20] 5 cmH20 Plateau Pressure:  [12 cmH20-21 cmH20] 21 cmH20 INTAKE / OUTPUT:  Intake/Output Summary (Last 24 hours) at 06/28/2013 0825 Last data filed at 06/28/2013 0700  Gross per 24 hour  Intake 6345.32 ml  Output    595 ml  Net 5750.32 ml    PHYSICAL EXAMINATION:  Gen: tachypnic on vent HEENT: NCAT ETT in place PULM: diminished left, CTA R CV: tachy, regular AB: moderately distended with tachypnea but still fairly soft between breaths, no clear ascites Ext: trace edema Neuro: sedated on vent, not following commands Derm: bruising throughout extensor surfaces of hands/arms  LABS:  CBC  Recent Labs Lab 02/13/14 1635 02/13/14 2220 06/28/2013 0500  WBC 15.6* 15.9* 14.4*  HGB 12.8 11.8* 11.5*  HCT 37.6 34.0* 32.6*  PLT 115* 109* 105*    Coag's  Recent Labs Lab 01/28/2014 1258  02/13/14 0545 02/13/14 1635 06/28/2013 0500  APTT 49*  --  75*  --   --   INR 1.88*  < > 3.69* 3.93* 5.55*  < > = values in this interval not displayed. BMET  Recent Labs Lab 02/13/14 0545 02/13/14 1635 06/28/2013 0500  NA 133* 132* 125*  K 5.5* 6.1* 5.3  CL 100 96 88*  CO2 15* 12* 15*  BUN 27* 31* 34*  CREATININE 1.36* 1.91* 2.54*  GLUCOSE 166* 254* 357*   Electrolytes  Recent Labs Lab 02/13/14 0545 02/13/14 1635 06/28/2013 0500  CALCIUM 9.0 8.6 7.7*   Sepsis Markers  Recent Labs Lab 02/05/2014 1259 02/13/14 1055 02/13/14 1635  LATICACIDVEN 3.5* 10.2* 11.2*   ABG  Recent Labs Lab 01/20/2014 1924 02/13/14 0944 02/13/14 1309  PHART 7.465* 7.295* 7.236*  PCO2ART 25.9* 27.0* 27.8*  PO2ART 60.2* 63.1* 137.0*   Liver Enzymes  Recent Labs Lab 02/13/14 0545 02/13/14 1635 06/28/2013 0500  AST 49* 196* 1101*  ALT 39* 123* 584*  ALKPHOS 88 89 93  BILITOT 6.9* 6.7* 7.0*  ALBUMIN 1.8* 1.9* 1.6*   Cardiac Enzymes No results for input(s): TROPONINI, PROBNP in the last 168 hours. Glucose  Recent Labs Lab 02/13/14 0750 02/13/14 1201 02/13/14 1619 02/13/14 2023 02/13/14 2351 06/28/2013 0336  GLUCAP 139* 160* 230* 282* 280* 286*    Imaging [place] Abdomen Complete  02/13/2014   CLINICAL DATA:  Elevated  liver function tests.  EXAM: ULTRASOUND ABDOMEN COMPLETE  COMPARISON:  New abdominal ultrasound 04/19/2013.  FINDINGS: Gallbladder: Removed.  Common bile duct: Diameter: 0.4 cm.  Liver: The liver is shrunken with a nodular border consistent with cirrhosis. No focal lesion is identified. There is a small volume of abdominal ascites.  IVC: No abnormality visualized.  Pancreas: Visualized portion unremarkable.  Spleen: Size and appearance within normal limits.  Right Kidney: Length: 10.6 cm. Echogenicity within normal limits. No mass or hydronephrosis visualized.  Left Kidney: Length: 11.0 cm. Echogenicity within normal limits. No  mass or hydronephrosis visualized.  Abdominal aorta: No aneurysm visualized.  Other findings: Left pleural effusion is noted.  IMPRESSION: Cirrhotic liver with associated small volume of abdominal ascites. Small left pleural effusion is also identified.  Status post cholecystectomy.   Electronically Signed   By: Drusilla Kannerhomas  Dalessio M.D.   On: 02/13/2014 14:39   Portable Chest Xray  02/13/2014   CLINICAL DATA:  Hypoxia  EXAM: PORTABLE CHEST - 1 VIEW  COMPARISON:  Study obtained earlier in the day  FINDINGS: Endotracheal tube tip is 4.2 cm above the carina. Nasogastric tube tip and side port are below the diaphragm. Central catheter tip is in the superior vena cava near the cavoatrial junction. No pneumothorax. Loculated effusion persists on the left. There is interstitial edema with cardiomegaly and pulmonary venous hypertension. No new opacity.  IMPRESSION: Tube and catheter positions as described without pneumothorax. Congestive heart failure. Persistent loculated effusion on the left. No new opacity compared to earlier in the day.   Electronically Signed   By: Bretta BangWilliam  Woodruff M.D.   On: 02/13/2014 12:57   Dg Chest Port 1 View  02/13/2014   CLINICAL DATA:  Central catheter placement  EXAM: PORTABLE CHEST - 1 VIEW  COMPARISON:  February 12, 2014  FINDINGS: Central catheter tip is in the superior vena cava near the cavoatrial junction. No pneumothorax. There is a sizable loculated effusion on the left, slightly larger. There is generalized interstitial edema. Heart is mildly enlarged with pulmonary venous hypertension.  IMPRESSION: Central catheter tip in superior vena cava near cavoatrial junction. No pneumothorax. Evidence of congestive heart failure. Sizable loculated effusion persists on the left, slightly larger compared to 1 day prior.   Electronically Signed   By: Bretta BangWilliam  Woodruff M.D.   On: 02/13/2014 10:02     ASSESSMENT / PLAN:  PULMONARY OETT 11/30>> A:  Acute hypoxemic respiratory failure  > Pulmonary edema and possibly HCAP? Pleural effusion> presumably hepatic hydrothorax P:   -continue vent support -add fentanyl gtt now for vent synchrony -if she makes a dramatic turnaround hemodynamically could consider thoracentesis  CARDIOVASCULAR CVL 11/30 A: Shock> septic  No history of heart disease P:  -levophed and vasopressin for MAP > 65 -add stress dose steroids -CVP monitoring -tele -albumin and FFP replacement today  RENAL A:  Lactic acidosis from septic shock Hyperkalemia AKI, worrisome for hepatorenal syndrome with oliguria P:   -repeat lactic acid -Monitor BMET and UOP -Replace electrolytes as needed -d/c bicarb gtt -albumin for likely SBP+cirrhosis+AKI  GASTROINTESTINAL A:   Decompensated NASH cirrhosis, unclear initial source of decompensation Lower GI bleeding > seems to have resolved, Hgb stable Ascites small volume Transaminitis> likely shock liver P:   -bedside ultrasound today to re-evaluate ascites > would like to tap to sample fluid for possible SBP -continue PPI bid -continue octreotide gtt -aggressively correct coagulopathy -GI to see today> endoscopy?   HEMATOLOGIC A:  Coagulopathy due to cirrhosis P:  -  FFP again 12/1 -serial H/H and PT/INR -transfusion goal Hgb < 8 gm/dL if actively bleeding  INFECTIOUS A:  Septic shock? No clear source of infection right now but ddx includes SBP and HCAP (less likely) P:   BCx2 11/29 > UC 11/29 >>  Vanc 11/29 >> Cipro 11/29 >> Aztreonam 11/29 >> Rifaximin 550 bid 11/29 >>  ENDOCRINE A:  DM2 P:   -monitor glucose -SSI q4h  NEUROLOGIC A:  Acute encephalopathy most likely due to hepatic encephalopathy P:   -fentanyl gtt for comfort, RASS -1 -lactulose > bid per tube   FAMILY  - Updates: daughter and husband updated at bedside  12/1 by me,  Code status> full, family would not want more than 2-3 days of life support  TODAY'S SUMMARY: 59 y/o female with decompensated cirrhosis,  hepatic encephalopathy, septic shock and overall worsening multi-organ failure.  She will likely die from this illness.  I do not see value in transfer to [facility] this AM given dramatic decline.  The family agrees.  Will add more vasopressor support, try to place A-line if possible, repeat bedside ultrasound to look for ascites and perform diagnostic paracentesis if possible.  Family requests limited code blue, continued vasopressor support but no CPR.  CC time by me 55 minutes  Heber Scioto, MD [place] [facility] Pager: [phone] Cell: [phone] If no response, call [phone] 2014-03-08, 8:25 AM

## 2014-03-17 NOTE — Progress Notes (Signed)
35 cc of Ativan drip d/c'd down sink drain, 80 cc of Morphine d/c'd down sink drain with two nurses witnessing, Pam Shelva MajesticWest RN, and Margerie Fraiser [employer] RN.

## 2014-03-17 NOTE — Progress Notes (Signed)
[facility]  Lengthy conversation with the patient's family today.  They do not want her to undergo CPR or have more procedures.  We will not escalate her care from now.  If no dramatic improvement by tomorrow morning then we will withdraw support on 12/2  Heber CarolinaBrent Jenesys Casseus, MD [place] [facility] Pager: [phone] Cell: [phone] If no response, call [phone]

## 2014-03-17 NOTE — Progress Notes (Signed)
Nutrition Brief Note   Pt identified as Ventilated patient and Braden Score < 12 Chart reviewed. MD noted family does not wish to escalate care at this time. May withdraw pt from care tomorrow if no dramatic improvement is seen. No nutrition interventions warranted at this time.  Please consult as needed.   Lloyd HugerSarah F Analilia Geddis MS RD LDN Clinical Dietitian Pager:[phone]

## 2014-03-17 NOTE — Progress Notes (Signed)
ANTIBIOTIC CONSULT NOTE - Follow Up  Pharmacy Consult for Aztreonam, Vancomycin, Cipro Indication: rule out sepsis, SBP  Allergies  Allergen Reactions  . Latex Rash  . Actos [Pioglitazone] Swelling and Other (See Comments)    edema Edema   . Byetta 10 Mcg Pen [Exenatide] Other (See Comments)    Stomach upset  . Crestor [Rosuvastatin] Other (See Comments)    Other reaction(s): Liver Disorder Elevated LFT's Increases LFT  . Lipitor [Atorvastatin] Other (See Comments)    Other reaction(s): Muscle Pain Muscle aches  . Other Other (See Comments)    Stomach upset  . Penicillin G Hives  . Penicillins Hives and Itching    All over the body.    Patient Measurements: Height: 5\' 4"  (162.6 cm) Weight: 176 lb 12.9 oz (80.2 kg) IBW/kg (Calculated) : 54.7  Vital Signs: Temp: 99 F (37.2 C) (12/01 0700) Temp Source: Core (Comment) (12/01 0400) BP: 91/48 mmHg (12/01 0700) Pulse Rate: 117 (12/01 0700) Intake/Output from previous day: 11/30 0701 - 12/01 0700 In: 6620.3 [I.V.:4787.3; Blood:473; NG/GT:360; IV Piggyback:1000] Out: 595 [Urine:220; Emesis/NG output:250; Stool:125]  Labs:  Recent Labs  02/13/14 0545  02/13/14 1635 02/13/14 2220  0500  WBC 15.0*  < > 15.6* 15.9* 14.4*  HGB 13.4  < > 12.8 11.8* 11.5*  PLT 107*  < > 115* 109* 105*  CREATININE 1.36*  --  1.91*  --  2.54*  < > = values in this interval not displayed. Estimated Creatinine Clearance: 24.1 mL/min (by C-G formula based on Cr of 2.54). No results for input(s): VANCOTROUGH, VANCOPEAK, VANCORANDOM, GENTTROUGH, GENTPEAK, GENTRANDOM, TOBRATROUGH, TOBRAPEAK, TOBRARND, AMIKACINPEAK, AMIKACINTROU, AMIKACIN in the last 72 hours.    Assessment: [age] yoF presented to ED on 11/29 with AMS as reported by husband.PMH includes diabetes, cirrhosis, encephalopathy, NASH, [facility], and COPD.S/p thoracentesis on 12/19/13 and paracentesis on 12/16/13.Husband also reports intermittent nosebleed,  blood in stool, and lactulose q4h (ammonia = 67) today.Pt reports abdominal pain.Pharmacy is consulted to dose Cipro, aztreonam, and vancomycin for suspected sepsis, SBP.  Allergy: PCN (Hives).  11/29 >> Levaquin x 1 (switch to cipro) 11/30 >> Cipro >> 11/29 >> Aztreonam >>  11/29 >> Vanc >>  Temp: 99 WBC: elevated Renal: AKI - worsening (on pressors), SCr 2.54, CrCl ~ 29 ml/min (CG), ~26 ml/min (normalized) Lactic acid 3.5  Micro: 11/29 blood x2: ngtd 11/29 urine: ordered 11/29 MRSA screen: neg  Today is day #3 Levaquin 750mg  x1 then cipro 400 q12h, aztreonam 2g IV q8h, vanc 750mg  q12h for sepsis, possible sources SBP, UTI, HCAP.  Goal of Therapy:  Vancomycin trough level 15-20 mcg/ml Appropriate abx dosing, eradication of infection.   Plan:  1.  Adjust vancomycin to 1g IV q24h for worsening SCr. 2.  Reduce Aztreonam to 1g IV q8h. 3.  Reduce Cipro to 400 mg IV q24h. 4.  F/u SCr, vancomycin levels when appropriate, clinical course.  Clance BollAmanda Codee Bloodworth, PharmD, BCPS Pager: [phone] 02/25/2014 8:47 AM

## 2014-03-17 DEATH — deceased

## 2014-03-27 ENCOUNTER — Other Ambulatory Visit: Payer: Managed Care, Other (non HMO)

## 2014-07-17 ENCOUNTER — Ambulatory Visit: Payer: Managed Care, Other (non HMO) | Admitting: Nurse Practitioner

## 2015-10-04 IMAGING — CR DG CHEST 1V
1 series · 1 of 1 positions shown · non-contrast
Comparison: December 17, 2013

CLINICAL DATA: Status post left-sided thoracentesis for effusion

EXAM:
CHEST - 1 VIEW

[w chest pa]
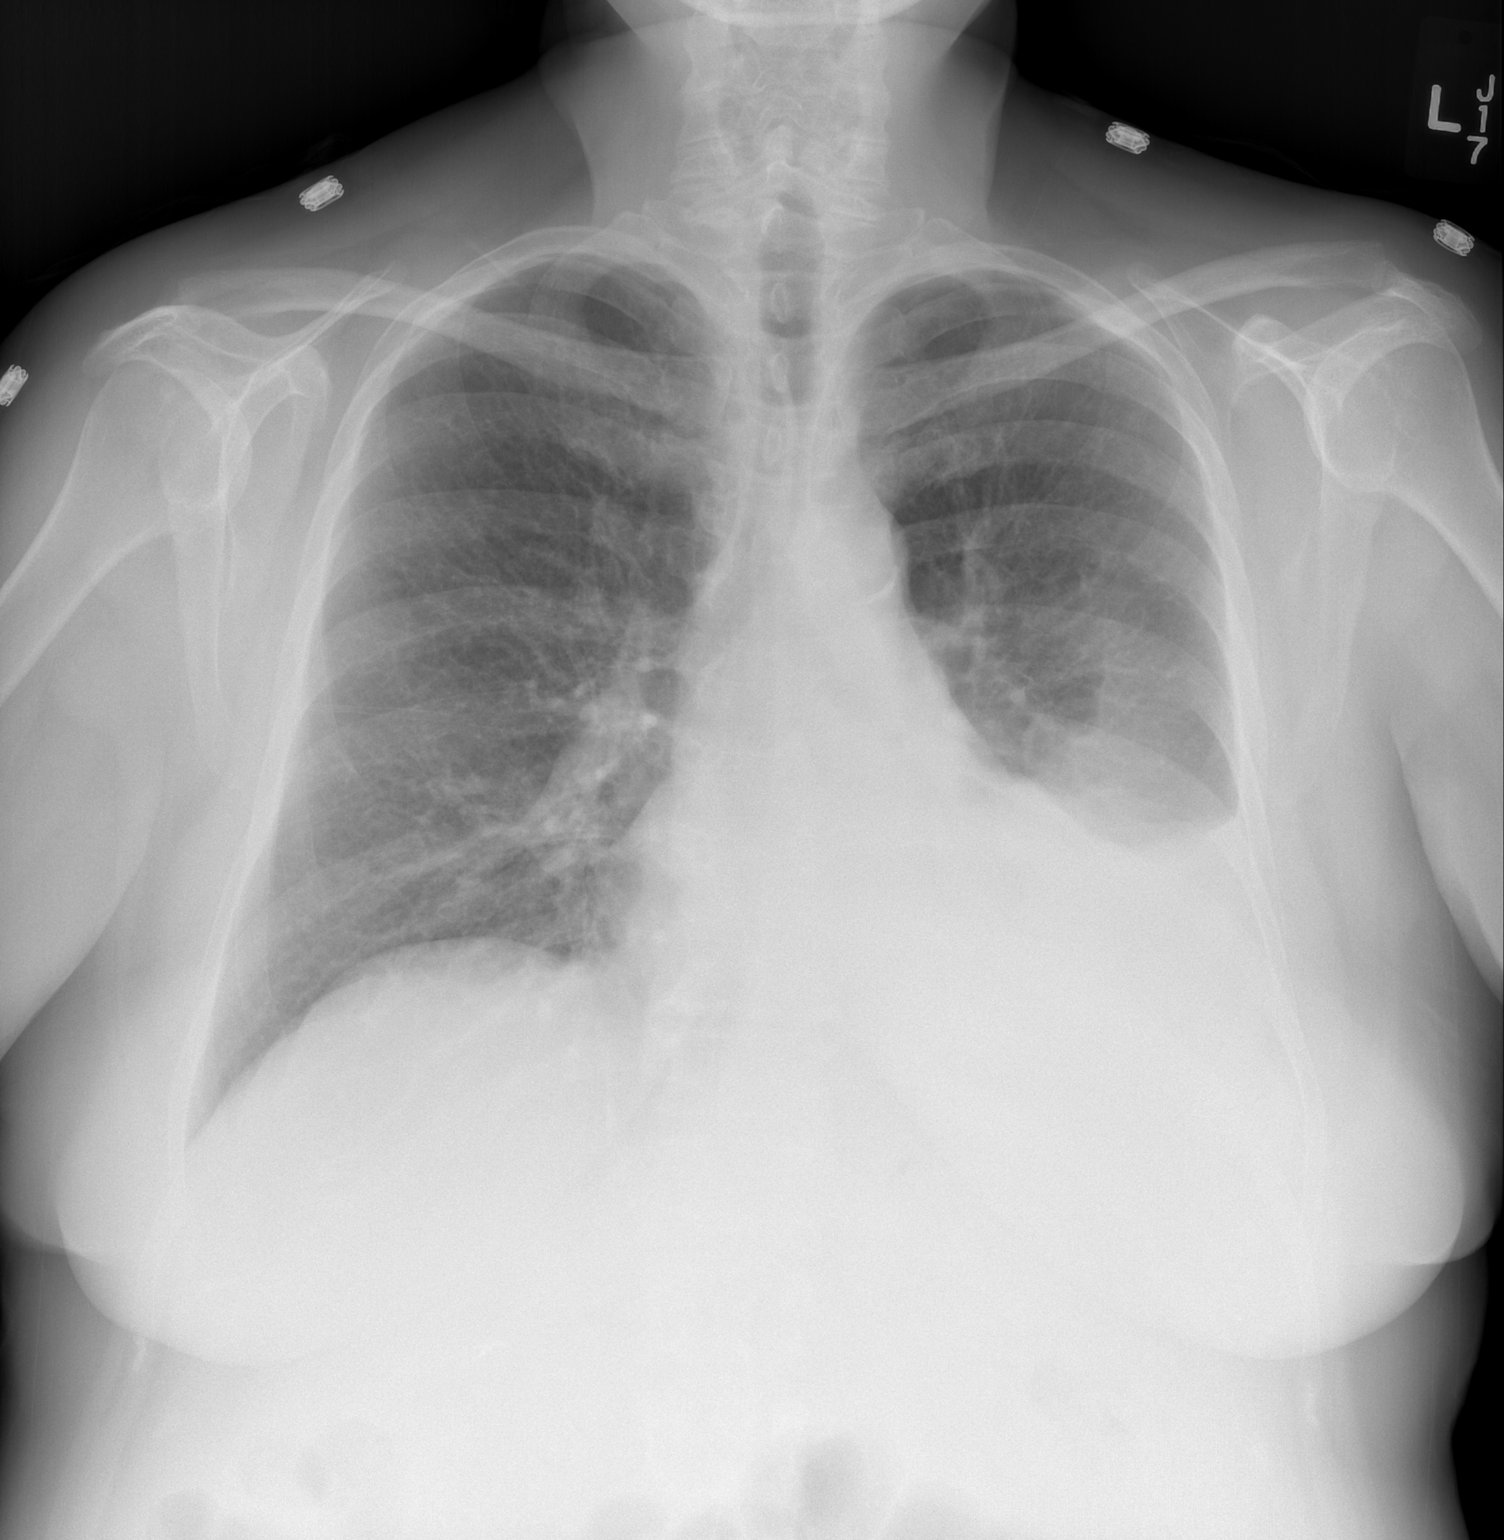

[1 of 1 positions shown; findings below may reference images not displayed]

FINDINGS: Effusion is smaller on the left post thoracentesis. Moderate
effusion remains on the left with left base
consolidation/atelectasis. Right lung is clear. Heart is upper
normal in size with pulmonary vascularity within normal limits. No
adenopathy. No bone lesions.
IMPRESSION: Left effusion smaller post thoracentesis. Moderate effusion remains
with left base atelectasis/ consolidation. Right lung clear. No
apparent pneumothorax.

## 2015-11-29 IMAGING — US US ABDOMEN COMPLETE
1 series · 14 of 25 positions shown · non-contrast
Comparison: New abdominal ultrasound 04/19/2013.

CLINICAL DATA: Elevated liver function tests.

EXAM:
ULTRASOUND ABDOMEN COMPLETE

[Series 1: us abdomen complete · 0.26mm/px · 14 of 53 slices shown]
[im 1/53]
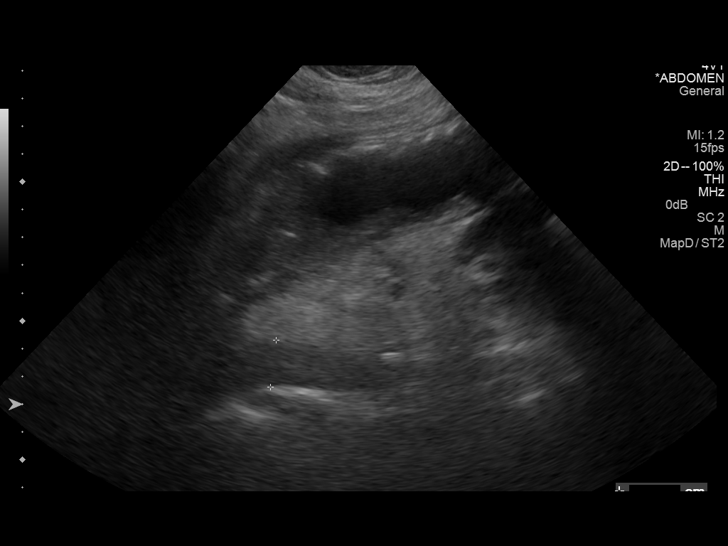
[im 5/53]
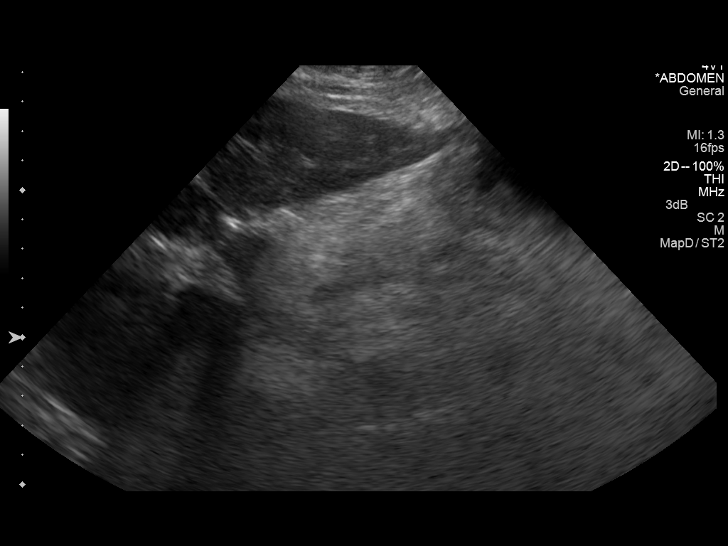
[im 9/53]
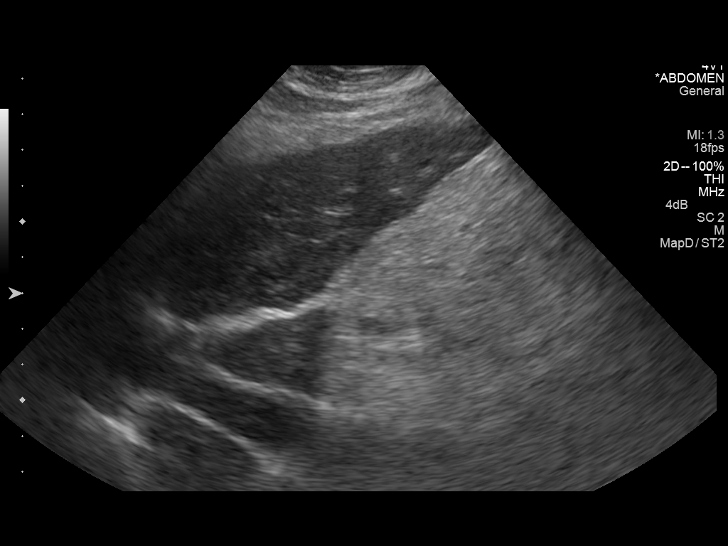
[im 14/53]
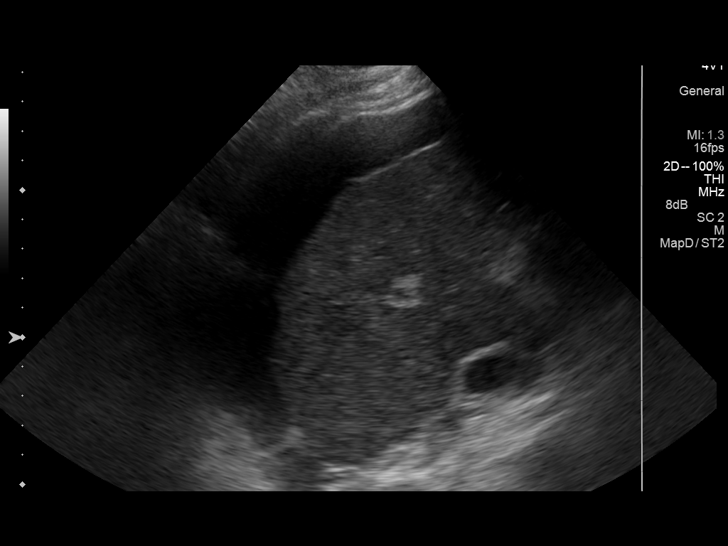
[im 18/53]
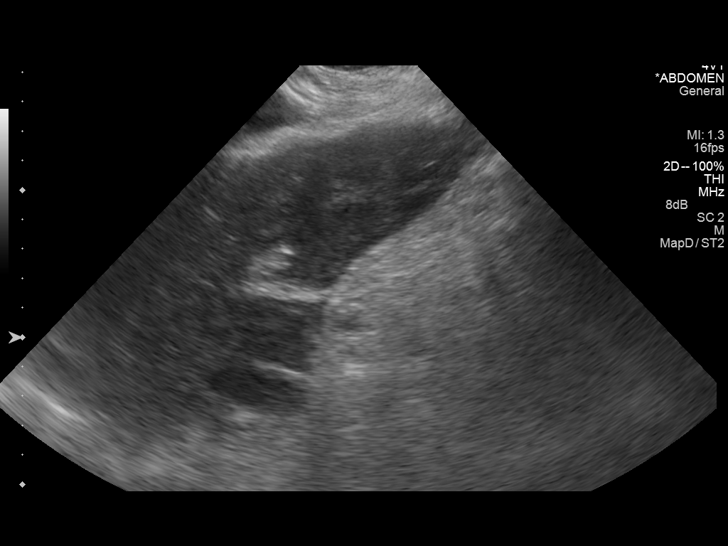
[im 20/53]
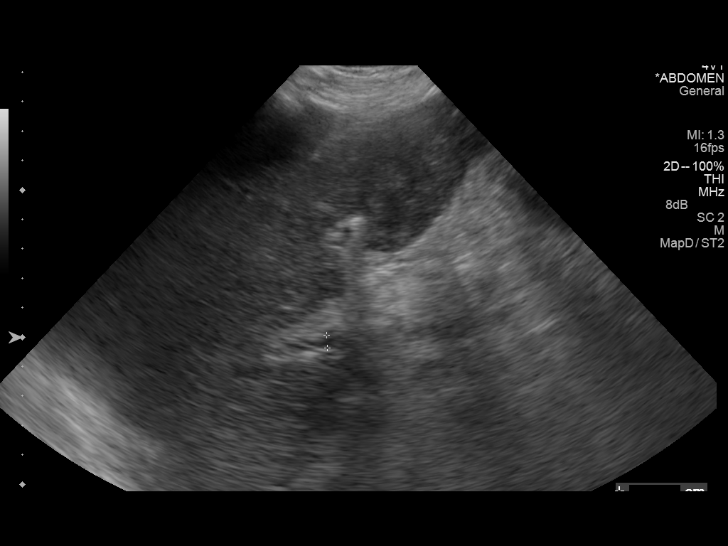
[im 24/53]
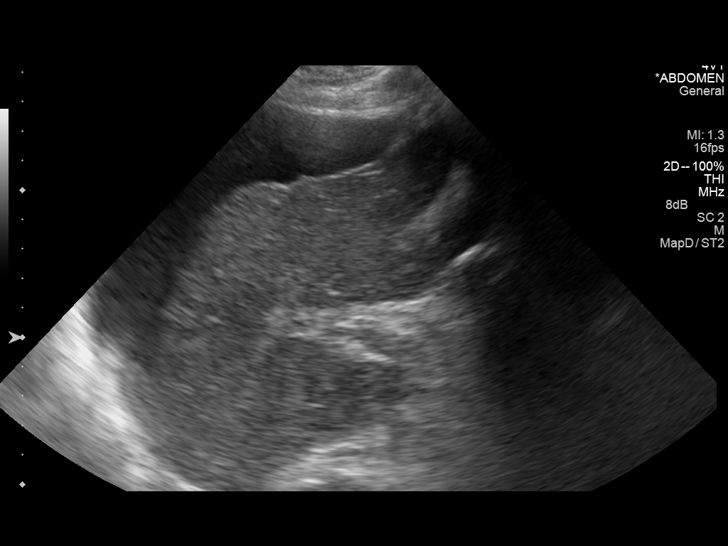
[im 29/53]
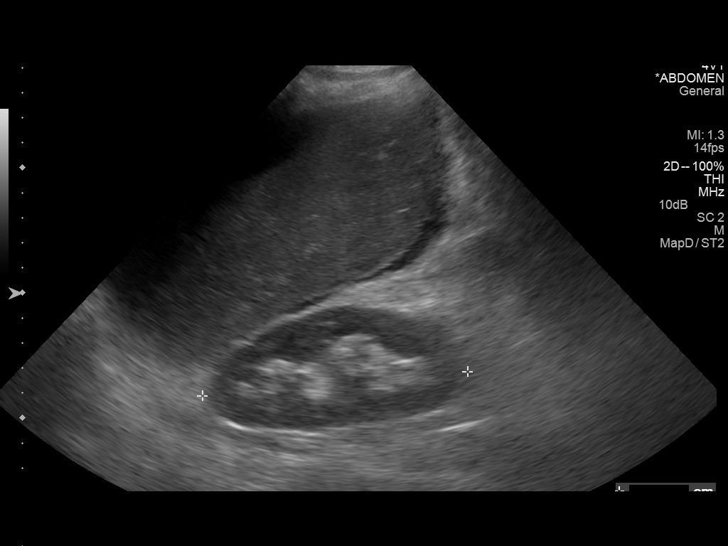
[im 33/53]
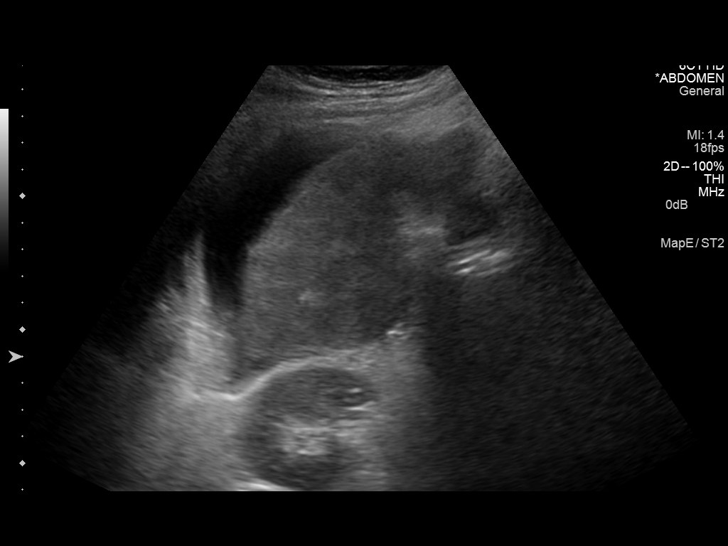
[im 35/53]
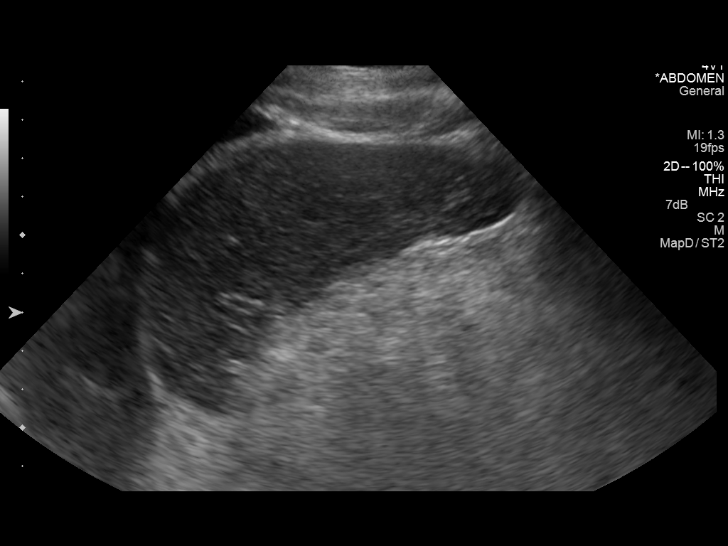
[im 40/53]
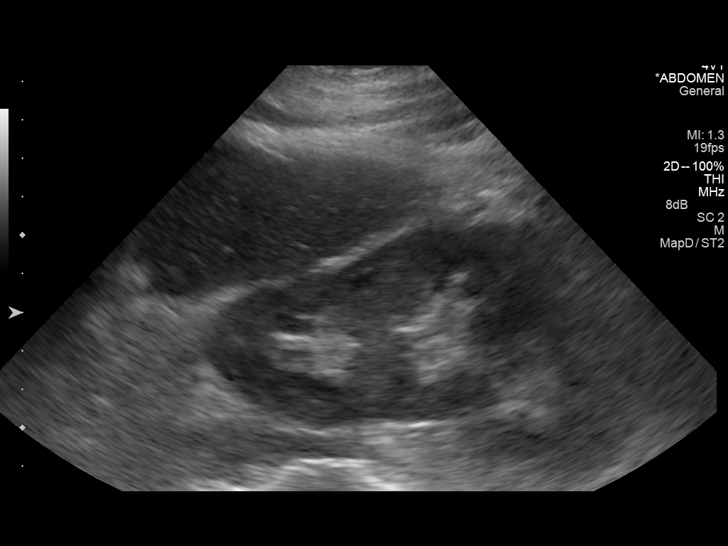
[im 44/53]
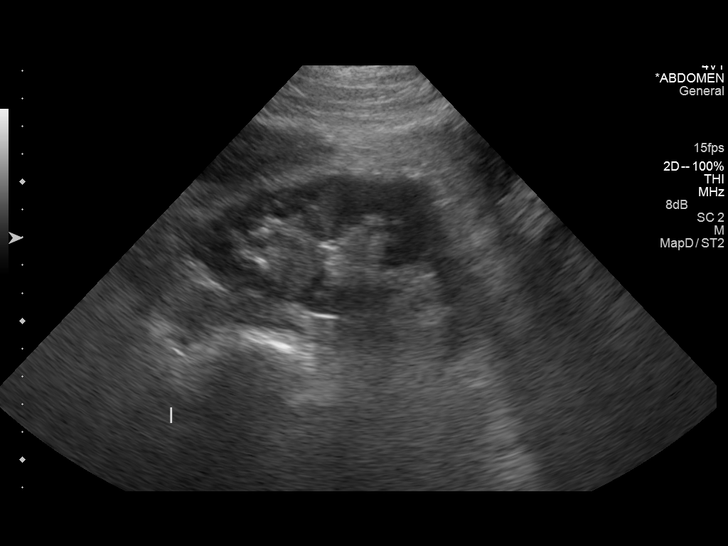
[im 48/53]
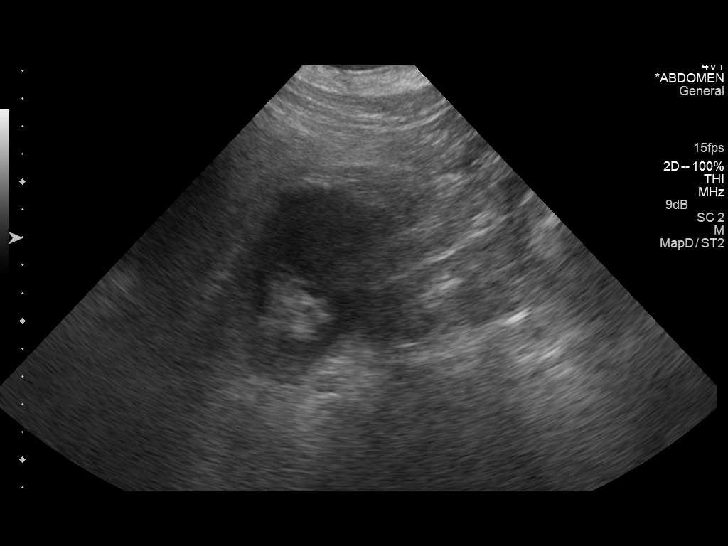
[im 53/53]
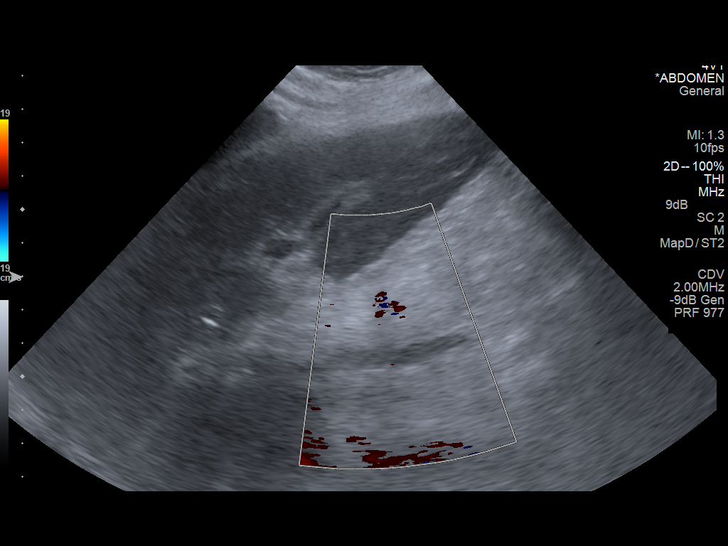

[14 of 25 positions shown; findings below may reference images not displayed]

FINDINGS: Gallbladder: Removed.

Common bile duct: Diameter: 0.4 cm.

Liver: The liver is shrunken with a nodular border consistent with
cirrhosis. No focal lesion is identified. There is a small volume of
abdominal ascites.

IVC: No abnormality visualized.

Pancreas: Visualized portion unremarkable.

Spleen: Size and appearance within normal limits.

Right Kidney: Length: 10.6 cm. Echogenicity within normal limits. No
mass or hydronephrosis visualized.

Left Kidney: Length: 11.0 cm. Echogenicity within normal limits. No
mass or hydronephrosis visualized.

Abdominal aorta: No aneurysm visualized.

Other findings: Left pleural effusion is noted.
IMPRESSION: Cirrhotic liver with associated small volume of abdominal ascites.
Small left pleural effusion is also identified.

Status post cholecystectomy.

## 2015-11-29 IMAGING — DX DG CHEST 1V PORT
1 series · 1 of 1 positions shown · non-contrast
Comparison: February 12, 2014

CLINICAL DATA: Central catheter placement

EXAM:
PORTABLE CHEST - 1 VIEW

[chest ap]
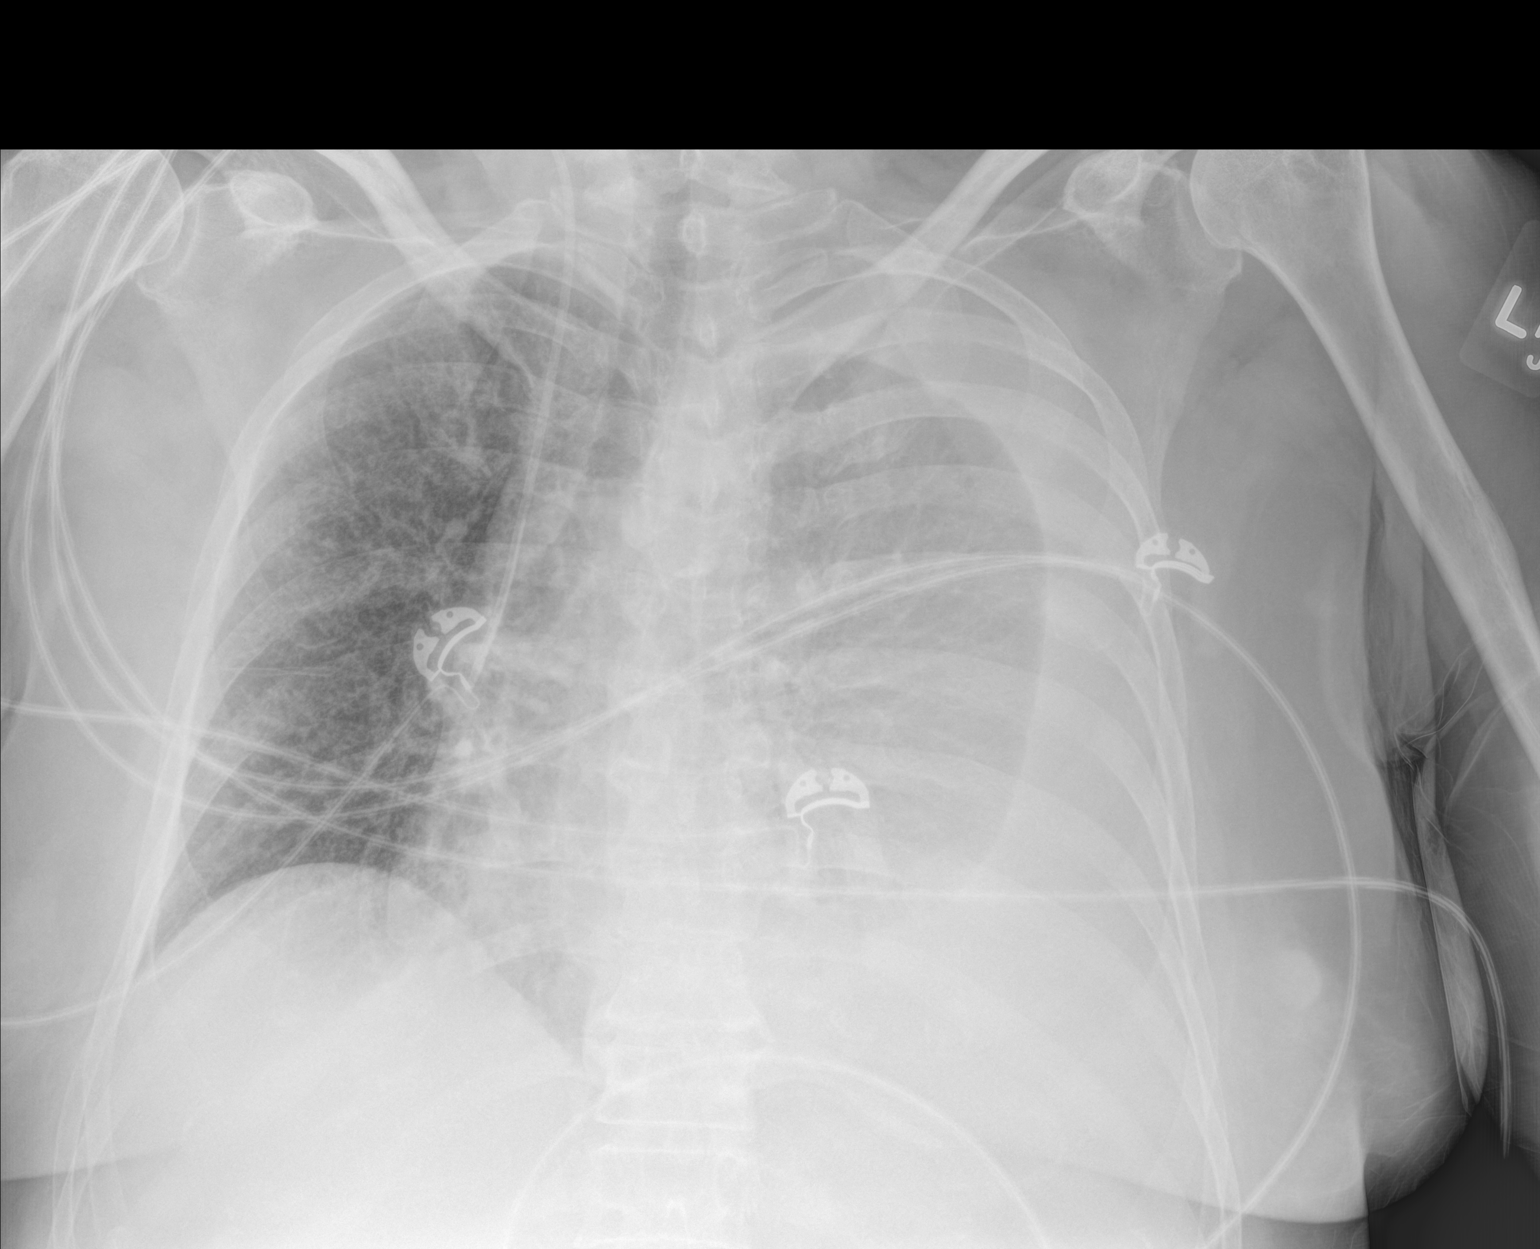

[1 of 1 positions shown; findings below may reference images not displayed]

FINDINGS: Central catheter tip is in the superior vena cava near the
cavoatrial junction. No pneumothorax. There is a sizable loculated
effusion on the left, slightly larger. There is generalized
interstitial edema. Heart is mildly enlarged with pulmonary venous
hypertension.
IMPRESSION: Central catheter tip in superior vena cava near cavoatrial junction.
No pneumothorax. Evidence of congestive heart failure. Sizable
loculated effusion persists on the left, slightly larger compared to
1 day prior.

## 2015-11-29 IMAGING — DX DG CHEST 1V PORT
1 series · 1 of 1 positions shown · non-contrast
Comparison: Study obtained earlier in the day

CLINICAL DATA: Hypoxia

EXAM:
PORTABLE CHEST - 1 VIEW

[chest ap]
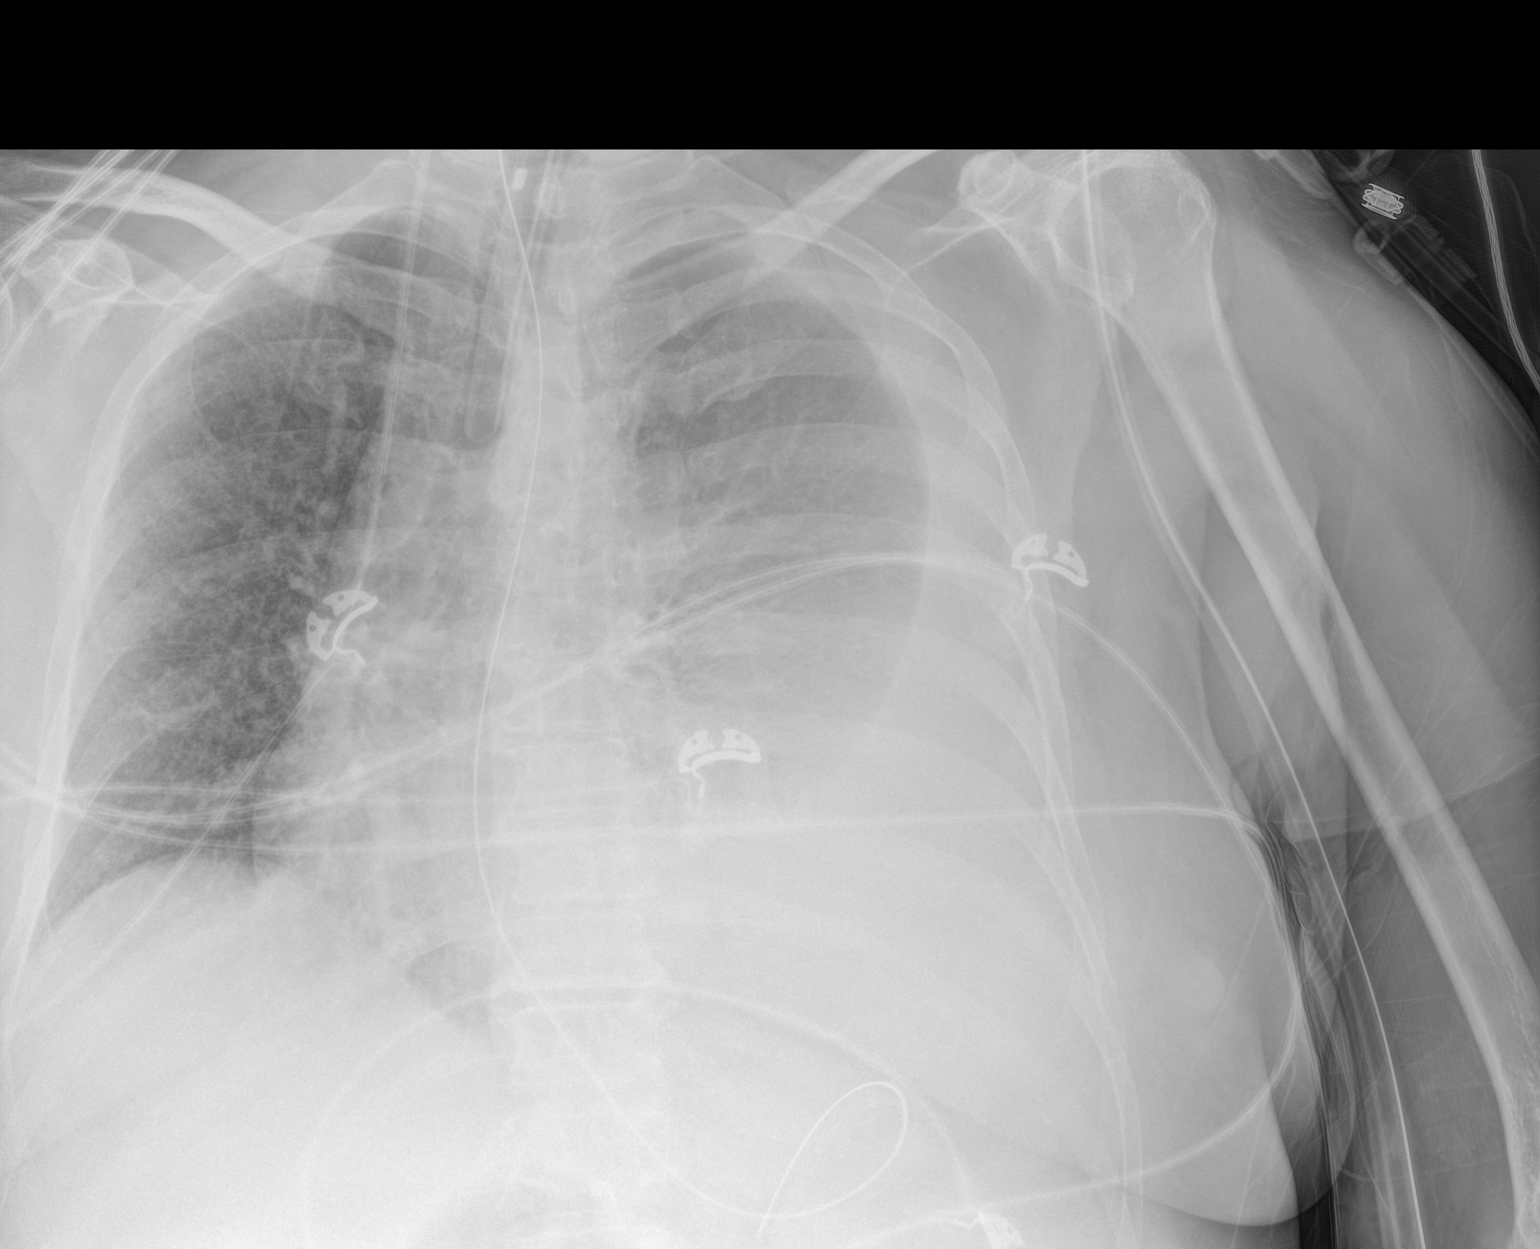

[1 of 1 positions shown; findings below may reference images not displayed]

FINDINGS: Endotracheal tube tip is 4.2 cm above the carina. Nasogastric tube
tip and side port are below the diaphragm. Central catheter tip is
in the superior vena cava near the cavoatrial junction. No
pneumothorax. Loculated effusion persists on the left. There is
interstitial edema with cardiomegaly and pulmonary venous
hypertension. No new opacity.
IMPRESSION: Tube and catheter positions as described without pneumothorax.
Congestive heart failure. Persistent loculated effusion on the left.
No new opacity compared to earlier in the day.

## 2015-11-30 IMAGING — DX DG CHEST 1V PORT
1 series · 1 of 1 positions shown · non-contrast
Comparison: 02/13/2014.

CLINICAL DATA: Respiratory failure.

EXAM:
PORTABLE CHEST - 1 VIEW

[chest ap]
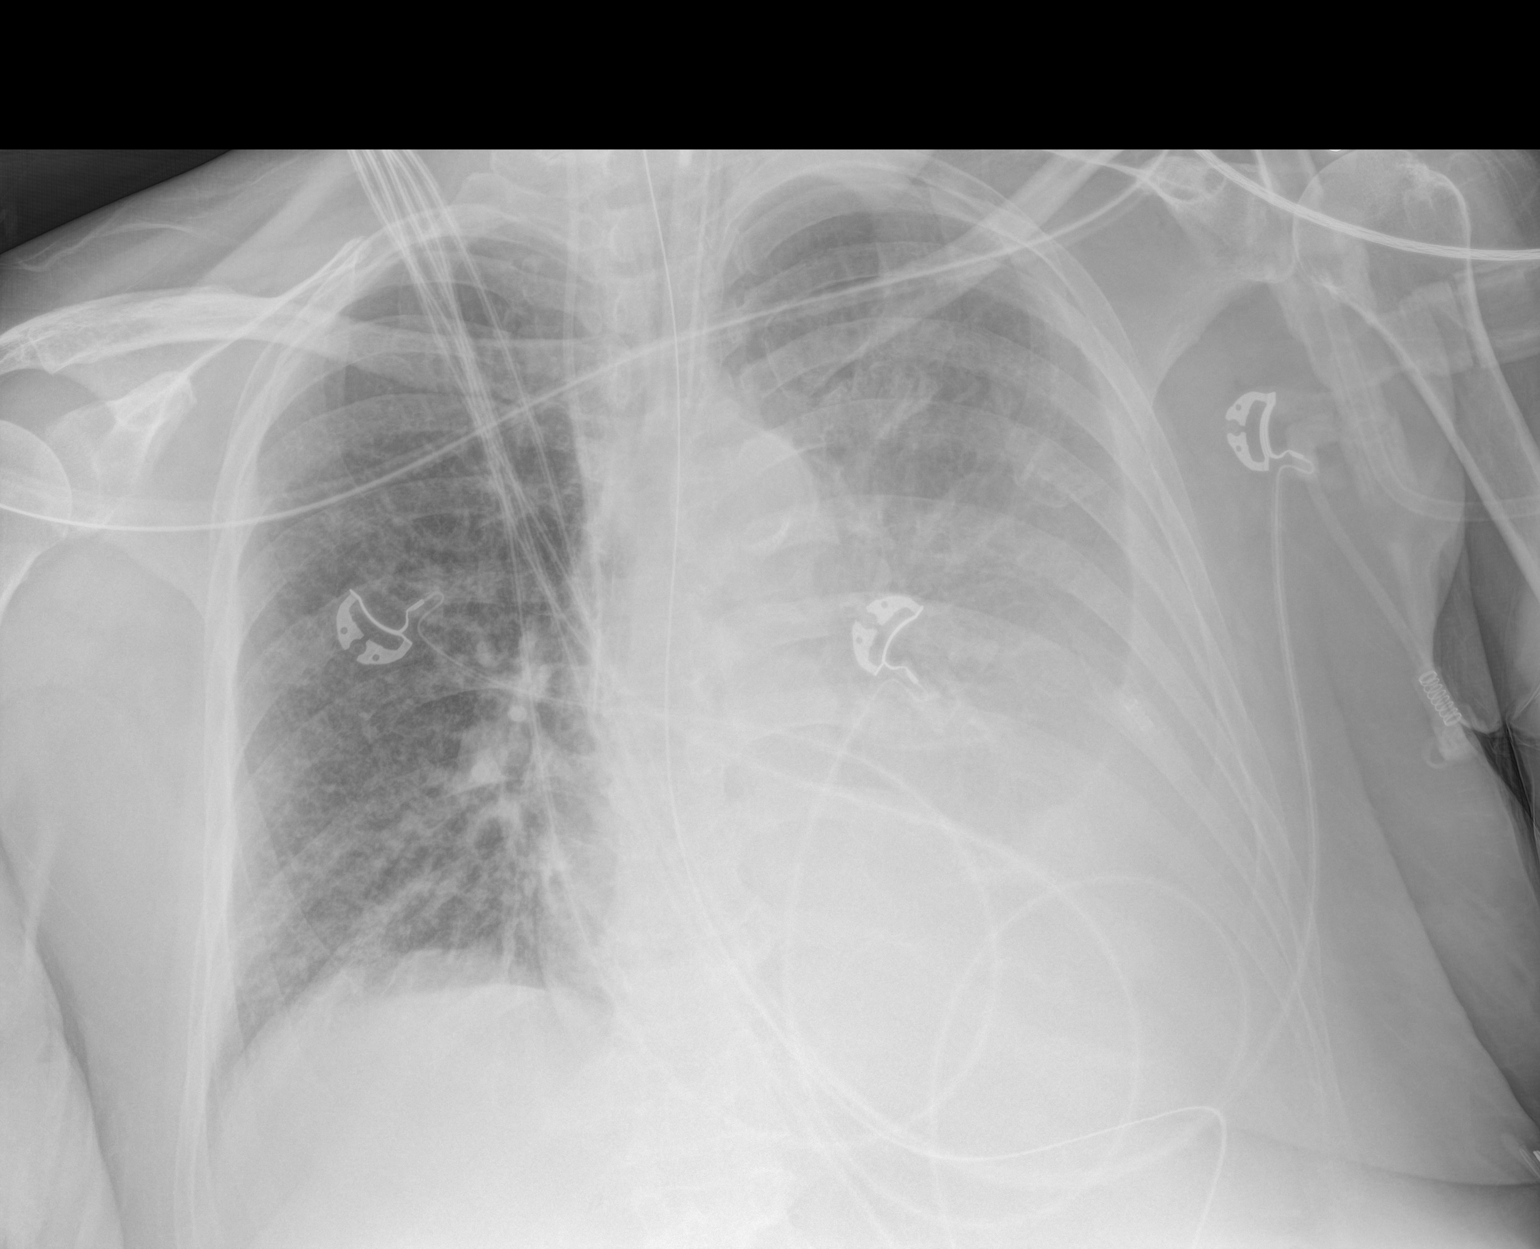

[1 of 1 positions shown; findings below may reference images not displayed]

FINDINGS: Endotracheal tube, NG tube, right IJ line in stable position. Heart
size stable. Diffuse bilateral pulmonary infiltrates and prominent
left pleural effusion. No change. No pneumothorax.
IMPRESSION: 1. Lines and tubes in stable position.
2. Persistent bilateral pulmonary infiltrates and prominent left
pleural effusion, no change.
3. Heart size stable. No pulmonary venous congestion on today's
exam.
